# Patient Record
Sex: Female | Born: 1980
Health system: Southern US, Community
[De-identification: ages and names within clinical notes are randomized; demographics above are authoritative.]

## PROBLEM LIST (undated history)

## (undated) DIAGNOSIS — I10 Essential (primary) hypertension: Secondary | ICD-10-CM

## (undated) DIAGNOSIS — E079 Disorder of thyroid, unspecified: Secondary | ICD-10-CM

## (undated) DIAGNOSIS — E559 Vitamin D deficiency, unspecified: Secondary | ICD-10-CM

## (undated) DIAGNOSIS — K649 Unspecified hemorrhoids: Secondary | ICD-10-CM

## (undated) DIAGNOSIS — K30 Functional dyspepsia: Secondary | ICD-10-CM

## (undated) DIAGNOSIS — D219 Benign neoplasm of connective and other soft tissue, unspecified: Secondary | ICD-10-CM

## (undated) DIAGNOSIS — E119 Type 2 diabetes mellitus without complications: Secondary | ICD-10-CM

## (undated) DIAGNOSIS — R270 Ataxia, unspecified: Secondary | ICD-10-CM

## (undated) HISTORY — DX: Type 2 diabetes mellitus without complications: E11.9

## (undated) HISTORY — DX: Benign neoplasm of connective and other soft tissue, unspecified: D21.9

## (undated) HISTORY — PX: TUBAL LIGATION: SHX77

## (undated) HISTORY — PX: NO PAST SURGERIES: SHX2092

## (undated) HISTORY — DX: Disorder of thyroid, unspecified: E07.9

## (undated) HISTORY — DX: Ataxia, unspecified: R27.0

## (undated) HISTORY — DX: Essential (primary) hypertension: I10

---
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
## 2001-01-09 ENCOUNTER — Emergency Department (HOSPITAL_COMMUNITY): Admission: EM | Admit: 2001-01-09 | Discharge: 2001-01-09 | Payer: Self-pay | Admitting: Internal Medicine

## 2001-01-17 ENCOUNTER — Encounter: Payer: Self-pay | Admitting: Internal Medicine

## 2001-01-17 ENCOUNTER — Ambulatory Visit (HOSPITAL_COMMUNITY): Admission: RE | Admit: 2001-01-17 | Discharge: 2001-01-17 | Payer: Self-pay | Admitting: Internal Medicine

## 2002-11-14 ENCOUNTER — Emergency Department (HOSPITAL_COMMUNITY): Admission: EM | Admit: 2002-11-14 | Discharge: 2002-11-14 | Payer: Self-pay | Admitting: Emergency Medicine

## 2002-11-16 ENCOUNTER — Emergency Department (HOSPITAL_COMMUNITY): Admission: EM | Admit: 2002-11-16 | Discharge: 2002-11-16 | Payer: Self-pay | Admitting: Emergency Medicine

## 2004-03-04 ENCOUNTER — Emergency Department (HOSPITAL_COMMUNITY): Admission: EM | Admit: 2004-03-04 | Discharge: 2004-03-04 | Payer: Self-pay | Admitting: Emergency Medicine

## 2005-04-01 ENCOUNTER — Emergency Department (HOSPITAL_COMMUNITY): Admission: EM | Admit: 2005-04-01 | Discharge: 2005-04-01 | Payer: Self-pay | Admitting: Emergency Medicine

## 2007-03-28 ENCOUNTER — Emergency Department (HOSPITAL_COMMUNITY): Admission: EM | Admit: 2007-03-28 | Discharge: 2007-03-28 | Payer: Self-pay | Admitting: Emergency Medicine

## 2009-08-29 ENCOUNTER — Emergency Department (HOSPITAL_COMMUNITY): Admission: EM | Admit: 2009-08-29 | Discharge: 2009-08-29 | Payer: Self-pay | Admitting: Emergency Medicine

## 2011-07-09 ENCOUNTER — Emergency Department (HOSPITAL_COMMUNITY)
Admission: EM | Admit: 2011-07-09 | Discharge: 2011-07-09 | Disposition: A | Discharge status: Home / Self Care | Payer: Self-pay | Attending: Emergency Medicine | Admitting: Emergency Medicine

## 2011-07-09 DIAGNOSIS — R300 Dysuria: Secondary | ICD-10-CM | POA: Insufficient documentation

## 2011-07-09 DIAGNOSIS — R350 Frequency of micturition: Secondary | ICD-10-CM | POA: Insufficient documentation

## 2011-07-09 HISTORY — DX: Functional dyspepsia: K30

## 2011-07-09 LAB — URINALYSIS, ROUTINE W REFLEX MICROSCOPIC
Bilirubin Urine: NEGATIVE
Glucose, UA: NEGATIVE mg/dL
Hgb urine dipstick: NEGATIVE
Ketones, ur: NEGATIVE mg/dL
Leukocytes, UA: NEGATIVE
Nitrite: NEGATIVE
Protein, ur: NEGATIVE mg/dL
Specific Gravity, Urine: 1.025 (ref 1.005–1.030)
Urobilinogen, UA: 0.2 mg/dL (ref 0.0–1.0)
pH: 6 (ref 5.0–8.0)

## 2011-07-09 MED ORDER — PHENAZOPYRIDINE HCL 200 MG PO TABS: 200.0000 mg | ORAL_TABLET | Freq: Three times a day (TID) | ORAL | Status: AC

## 2011-07-09 NOTE — ED Notes (Signed)
Complain of pain with urination

## 2011-07-09 NOTE — ED Provider Notes (Signed)
Medical screening examination/treatment/procedure(s) were performed by non-physician practitioner and as supervising physician I was immediately available for consultation/collaboration.  Donnetta Hutching, MD 07/09/11 [phone]

## 2011-07-09 NOTE — ED Provider Notes (Signed)
History     CSN: [id number] Arrival date & time: 07/09/2011  1:28 PM   First MD Initiated Contact with Patient 07/09/11 1422      Chief Complaint  Patient presents with  . Dysuria    (Consider location/radiation/quality/duration/timing/severity/associated sxs/prior treatment) HPI Comments: R sided "stabbing pains when i need to urinate".  Uncomfortable when she urinates.  Urine is "dark" but not malodorous.  No hematuria.  + frequency.  No urgency.  No fever.  Mild B lower back pain.  No vaginal d/c.  LMP ~ 2 weeks ago.  irreg menses.  Denies being sexually active.  Patient is a 30 y.o. female presenting with dysuria. The history is provided by the patient. No language interpreter was used.  Dysuria  This is a new problem. Episode onset: 3 days ago. The problem occurs every urination. The problem has not changed since onset.The quality of the pain is described as stabbing. The pain is moderate. Associated symptoms include frequency. Pertinent negatives include no chills, no nausea, no vomiting, no hematuria, no urgency and no flank pain.    Past Medical History  Diagnosis Date  . Acid indigestion     History reviewed. No pertinent past surgical history.  History reviewed. No pertinent family history.  History  Substance Use Topics  . Smoking status: Not on file  . Smokeless tobacco: Not on file  . Alcohol Use: No    OB History    Grav Para Term Preterm Abortions TAB SAB Ect Mult Living                  Review of Systems  Constitutional: Negative for fever and chills.  Gastrointestinal: Negative for nausea and vomiting.  Genitourinary: Positive for dysuria and frequency. Negative for urgency, hematuria, flank pain, vaginal bleeding, vaginal discharge and vaginal pain.  All other systems reviewed and are negative.    Allergies  Review of patient's allergies indicates no known allergies.  Home Medications   Current Outpatient Rx  Name Route Sig Dispense Refill    . UNKNOWN TO PATIENT Oral Take 1 tablet by mouth daily. Unable to tell me name of medication. Finished yesterday       BP 125/75  Pulse 83  Temp(Src) 97.7 F (36.5 C) (Oral)  Resp 17  Ht 4\' 11"  (1.499 m)  Wt 184 lb (83.462 kg)  BMI 37.16 kg/m2  SpO2 97%  Physical Exam  Abdominal: There is no tenderness. There is no rigidity, no rebound, no guarding, no CVA tenderness, no tenderness at McBurney's point and negative Murphy's sign.  Musculoskeletal:       Lumbar back: She exhibits pain. She exhibits normal range of motion, no tenderness, no bony tenderness, no swelling, no deformity, no laceration, no spasm and normal pulse.       Back:    ED Course  Procedures (including critical care time)   Labs Reviewed  URINALYSIS, ROUTINE W REFLEX MICROSCOPIC   No results found.   No diagnosis found.    MDM          Worthy Rancher, PA 07/09/11 [phone]

## 2011-07-10 LAB — URINE CULTURE
Colony Count: NO GROWTH
Culture  Setup Time: 201212131650
Culture: NO GROWTH

## 2012-02-20 ENCOUNTER — Emergency Department (HOSPITAL_COMMUNITY)
Admission: EM | Admit: 2012-02-20 | Discharge: 2012-02-20 | Disposition: A | Discharge status: Home / Self Care | Payer: Self-pay | Attending: Emergency Medicine | Admitting: Emergency Medicine

## 2012-02-20 ENCOUNTER — Encounter (HOSPITAL_COMMUNITY): Payer: Self-pay

## 2012-02-20 DIAGNOSIS — Y9229 Other specified public building as the place of occurrence of the external cause: Secondary | ICD-10-CM | POA: Insufficient documentation

## 2012-02-20 DIAGNOSIS — T7840XA Allergy, unspecified, initial encounter: Secondary | ICD-10-CM | POA: Insufficient documentation

## 2012-02-20 DIAGNOSIS — R21 Rash and other nonspecific skin eruption: Secondary | ICD-10-CM | POA: Insufficient documentation

## 2012-02-20 DIAGNOSIS — I10 Essential (primary) hypertension: Secondary | ICD-10-CM | POA: Insufficient documentation

## 2012-02-20 NOTE — ED Notes (Signed)
Pt was out this afternoon w/ children left hand began itching and swelling, she took benadryl and now better but wants to get checked out.

## 2012-02-20 NOTE — ED Provider Notes (Signed)
History    This chart was scribed for Charles B. Bernette Mayers, MD, MD by Smitty Pluck. The patient was seen in room APA16A and the patient's care was started at 3:44PM.   CSN: [id number]  Arrival date & time 02/20/12  1500   First MD Initiated Contact with Patient 02/20/12 1538      Chief Complaint  Patient presents with  . Allergic Reaction    (Consider location/radiation/quality/duration/timing/severity/associated sxs/prior treatment) Patient is a 31 y.o. female presenting with allergic reaction. The history is provided by the patient.  Allergic Reaction   Tara Stark is a 31 y.o. female who presents to the Emergency Department complaining of moderate allergic rxn onset today 2 hours ago. Pt reports having left hand itching and swelling. Pt reports taking benadryl afterwards with relief. Pt reports that her ears still feel funny and mild swelling.  Denies any problems with breathing. She reports [place]. Denies any other pain. Denies any known allergies.   Past Medical History  Diagnosis Date  . Acid indigestion     History reviewed. No pertinent past surgical history.  No family history on file.  History  Substance Use Topics  . Smoking status: Never Smoker   . Smokeless tobacco: Not on file  . Alcohol Use: No    OB History    Grav Para Term Preterm Abortions TAB SAB Ect Mult Living                  Review of Systems  All other systems reviewed and are negative.   10 Systems reviewed and all are negative for acute change except as noted in the HPI.   Allergies  Review of patient's allergies indicates no known allergies.  Home Medications   Current Outpatient Rx  Name Route Sig Dispense Refill  . UNKNOWN TO PATIENT Oral Take 1 tablet by mouth daily. Unable to tell me name of medication. Finished yesterday       BP 140/82  Pulse 90  Temp 98.5 F (36.9 C) (Oral)  Resp 20  Ht 4\' 11"  (1.499 m)  Wt 189 lb 6 oz (85.9  kg)  BMI 38.25 kg/m2  SpO2 100%  LMP 06/27/2011  Physical Exam  Nursing note and vitals reviewed. Constitutional: She is oriented to person, place, and time. She appears well-developed and well-nourished.  HENT:  Head: Normocephalic and atraumatic.  Right Ear: Tympanic membrane and ear canal normal.  Left Ear: Tympanic membrane and ear canal normal.  Eyes: EOM are normal. Pupils are equal, round, and reactive to light.  Neck: Normal range of motion. Neck supple.  Cardiovascular: Normal rate, normal heart sounds and intact distal pulses.   Pulmonary/Chest: Effort normal and breath sounds normal.  Abdominal: Bowel sounds are normal. She exhibits no distension. There is no tenderness.  Musculoskeletal: Normal range of motion. She exhibits no edema and no tenderness.  Neurological: She is alert and oriented to person, place, and time. She has normal strength. No cranial nerve deficit or sensory deficit.  Skin: Skin is warm and dry. No rash noted.  Psychiatric: She has a normal mood and affect.    ED Course  Procedures (including critical care time) DIAGNOSTIC STUDIES: Oxygen Saturation is 100% on room air, normal by my interpretation.    COORDINATION OF CARE: 3:48PM EDP discusses pt ED treatment with pt     Labs Reviewed - No data to display No results found.   No diagnosis found.  MDM  Mild allergic reaction to hand has resolved. No clear reason for "ear fullness" Advised to return to ED for worsening swelling, rash or breathing problems or if she has ear pain, drainage or fever.   I personally performed the services described in the documentation, which were scribed in my presence. The recorded information has been reviewed and considered.         Charles B. Bernette Mayers, MD 02/20/12 1556

## 2012-09-17 ENCOUNTER — Encounter (HOSPITAL_COMMUNITY): Payer: Self-pay | Admitting: *Deleted

## 2012-09-17 ENCOUNTER — Emergency Department (HOSPITAL_COMMUNITY)
Admission: EM | Admit: 2012-09-17 | Discharge: 2012-09-17 | Disposition: A | Discharge status: Home / Self Care | Payer: Self-pay | Attending: Emergency Medicine | Admitting: Emergency Medicine

## 2012-09-17 DIAGNOSIS — K625 Hemorrhage of anus and rectum: Secondary | ICD-10-CM | POA: Insufficient documentation

## 2012-09-17 DIAGNOSIS — Z8719 Personal history of other diseases of the digestive system: Secondary | ICD-10-CM | POA: Insufficient documentation

## 2012-09-17 DIAGNOSIS — K649 Unspecified hemorrhoids: Secondary | ICD-10-CM | POA: Insufficient documentation

## 2012-09-17 DIAGNOSIS — K921 Melena: Secondary | ICD-10-CM | POA: Insufficient documentation

## 2012-09-17 DIAGNOSIS — Z3202 Encounter for pregnancy test, result negative: Secondary | ICD-10-CM | POA: Insufficient documentation

## 2012-09-17 LAB — POCT I-STAT, CHEM 8
Calcium, Ion: 1.15 mmol/L (ref 1.12–1.23)
HCT: 43 % (ref 36.0–46.0)
Sodium: 139 mEq/L (ref 135–145)
TCO2: 30 mmol/L (ref 0–100)

## 2012-09-17 LAB — URINALYSIS, ROUTINE W REFLEX MICROSCOPIC
Glucose, UA: NEGATIVE mg/dL
Hgb urine dipstick: NEGATIVE
Leukocytes, UA: NEGATIVE
Protein, ur: NEGATIVE mg/dL
Specific Gravity, Urine: 1.025 (ref 1.005–1.030)
Urobilinogen, UA: 1 mg/dL (ref 0.0–1.0)

## 2012-09-17 LAB — PREGNANCY, URINE: Preg Test, Ur: NEGATIVE

## 2012-09-17 MED ORDER — HYDROCORTISONE 2.5 % RE CREA: TOPICAL_CREAM | RECTAL | Status: DC

## 2012-09-17 MED ORDER — DOCUSATE SODIUM 100 MG PO CAPS: 100.0000 mg | ORAL_CAPSULE | Freq: Two times a day (BID) | ORAL | Status: DC

## 2012-09-17 NOTE — ED Notes (Signed)
Pt c/o bright red blood in stool since yesterday.

## 2012-09-17 NOTE — ED Provider Notes (Signed)
History     CSN: [id number]  Arrival date & time 09/17/12  1945   First MD Initiated Contact with Patient 09/17/12 1957      Chief Complaint  Patient presents with  . Rectal Bleeding    (Consider location/radiation/quality/duration/timing/severity/associated sxs/prior treatment) HPI Comments: Patient presents with bright red blood with bowel movements for the past 2 days. This is happened 2 times. She missed her stools are firm and she has to strain. No history of hemorrhoids. She denies any abdominal pain nausea vomiting or diarrhea. The toilet water turned red and she has red with wiping. She denies any chest pain or shortness of breath, cough fever urinary symptoms she is not on any anticoagulation.  The history is provided by the patient.    Past Medical History  Diagnosis Date  . Acid indigestion     History reviewed. No pertinent past surgical history.  History reviewed. No pertinent family history.  History  Substance Use Topics  . Smoking status: Never Smoker   . Smokeless tobacco: Not on file  . Alcohol Use: No    OB History   Grav Para Term Preterm Abortions TAB SAB Ect Mult Living                  Review of Systems  Constitutional: Negative for fever, activity change and appetite change.  HENT: Negative for congestion and rhinorrhea.   Respiratory: Negative for cough, chest tightness and shortness of breath.   Cardiovascular: Negative for chest pain.  Gastrointestinal: Positive for blood in stool and hematochezia. Negative for nausea, vomiting and abdominal pain.  Genitourinary: Negative for dysuria, vaginal bleeding and vaginal discharge.  Skin: Negative for rash.  Neurological: Negative for dizziness and headaches.  A complete 10 system review of systems was obtained and all systems are negative except as noted in the HPI and PMH.    Allergies  Review of patient's allergies indicates no known allergies.  Home Medications   Current Outpatient Rx   Name  Route  Sig  Dispense  Refill  . docusate sodium (COLACE) 100 MG capsule   Oral   Take 1 capsule (100 mg total) by mouth every 12 (twelve) hours.   60 capsule   0   . hydrocortisone (ANUSOL-HC) 2.5 % rectal cream      Apply rectally 2 times daily   30 g   0     BP 142/95  Pulse 105  Temp(Src) 98.1 F (36.7 C)  Resp 20  Ht 4\' 11"  (1.499 m)  Wt 180 lb (81.647 kg)  BMI 36.34 kg/m2  SpO2 99%  LMP 07/31/2012  Physical Exam  Constitutional: She is oriented to person, place, and time. She appears well-developed and well-nourished. No distress.  texting on phone, no distress  HENT:  Head: Normocephalic and atraumatic.  Mouth/Throat: Oropharynx is clear and moist. No oropharyngeal exudate.  Eyes: Conjunctivae and EOM are normal. Pupils are equal, round, and reactive to light.  Neck: Normal range of motion. Neck supple.  Cardiovascular: Normal rate, regular rhythm and normal heart sounds.   No murmur heard. Pulmonary/Chest: Effort normal and breath sounds normal. No respiratory distress.  Abdominal: Soft. There is no tenderness. There is no rebound and no guarding.  Genitourinary: Guaiac negative stool.  Small external hemorrhoid at 6 oclock. Not thrombosed. No fissures. Guiac negative.  Chaperone present  Musculoskeletal: Normal range of motion. She exhibits no edema and no tenderness.  Neurological: She is alert and oriented to person, place, and  time. No cranial nerve deficit. She exhibits normal muscle tone. Coordination normal.  Skin: Skin is warm.    ED Course  Procedures (including critical care time)  Labs Reviewed  POCT I-STAT, CHEM 8 - Abnormal; Notable for the following:    Glucose, Bld 125 (*)    All other components within normal limits  URINALYSIS, ROUTINE W REFLEX MICROSCOPIC  PREGNANCY, URINE   No results found.   1. Rectal bleeding   2. Hemorrhoid       MDM  Bright red blood in the toilet bowl with stools. No abdominal pain nausea or  vomiting.  Continues to play on phone during interview.   Hemoglobin stable. Occult blood negative today. Small external hemorrhoid on exam.  Patient no distress. Abdomen soft and nontender. We'll treat symptomatically for hemorrhage giving stool softeners.     Glynn Octave, MD 09/17/12 2125

## 2012-10-01 ENCOUNTER — Encounter (HOSPITAL_COMMUNITY): Payer: Self-pay | Admitting: *Deleted

## 2012-10-01 ENCOUNTER — Emergency Department (HOSPITAL_COMMUNITY)
Admission: EM | Admit: 2012-10-01 | Discharge: 2012-10-01 | Disposition: A | Discharge status: Home / Self Care | Payer: Self-pay | Attending: Emergency Medicine | Admitting: Emergency Medicine

## 2012-10-01 DIAGNOSIS — Z3202 Encounter for pregnancy test, result negative: Secondary | ICD-10-CM | POA: Insufficient documentation

## 2012-10-01 DIAGNOSIS — R112 Nausea with vomiting, unspecified: Secondary | ICD-10-CM | POA: Insufficient documentation

## 2012-10-01 DIAGNOSIS — R197 Diarrhea, unspecified: Secondary | ICD-10-CM | POA: Insufficient documentation

## 2012-10-01 DIAGNOSIS — Z8719 Personal history of other diseases of the digestive system: Secondary | ICD-10-CM | POA: Insufficient documentation

## 2012-10-01 LAB — PREGNANCY, URINE: Preg Test, Ur: NEGATIVE

## 2012-10-01 LAB — URINALYSIS, ROUTINE W REFLEX MICROSCOPIC
Bilirubin Urine: NEGATIVE
Leukocytes, UA: NEGATIVE
Nitrite: NEGATIVE
Specific Gravity, Urine: 1.015 (ref 1.005–1.030)
Urobilinogen, UA: 0.2 mg/dL (ref 0.0–1.0)

## 2012-10-01 LAB — BASIC METABOLIC PANEL
CO2: 27 mEq/L (ref 19–32)
Chloride: 103 mEq/L (ref 96–112)
Creatinine, Ser: 0.54 mg/dL (ref 0.50–1.10)
GFR calc Af Amer: >90 mL/min (ref >90)
Potassium: 3.7 mEq/L (ref 3.5–5.1)
Sodium: 140 mEq/L (ref 135–145)

## 2012-10-01 LAB — CBC
Platelets: 224 10*3/uL (ref 150–400)
RBC: 5.26 MIL/uL — ABNORMAL HIGH (ref 3.87–5.11)
RDW: 14.9 % (ref 11.5–15.5)
WBC: 9.7 10*3/uL (ref 4.0–10.5)

## 2012-10-01 LAB — URINE MICROSCOPIC-ADD ON

## 2012-10-01 MED ORDER — SODIUM CHLORIDE 0.9 % IV SOLN: 1000.0000 mL | INTRAVENOUS | Status: DC

## 2012-10-01 MED ORDER — ONDANSETRON HCL 4 MG/2ML IJ SOLN
4.0000 mg | Freq: Once | INTRAMUSCULAR | Status: AC
  Administered 2012-10-01: 4 mg via INTRAVENOUS
  Filled 2012-10-01: qty 2

## 2012-10-01 MED ORDER — SODIUM CHLORIDE 0.9 % IV SOLN
1000.0000 mL | Freq: Once | INTRAVENOUS | Status: AC
  Administered 2012-10-01: 1000 mL via INTRAVENOUS

## 2012-10-01 MED ORDER — PROMETHAZINE HCL 25 MG PO TABS: 25.0000 mg | ORAL_TABLET | Freq: Four times a day (QID) | ORAL | Status: DC | PRN

## 2012-10-01 NOTE — ED Notes (Signed)
Pt c/o n/v/d, n/v X4 episodes, diarrhea X5 since the middle of night, denies any pain. 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

## 2012-10-01 NOTE — ED Provider Notes (Signed)
History     This chart was scribed for Lyanne Co, MD, MD by Smitty Pluck, ED Scribe. The patient was seen in room APA08/APA08 and the patient's care was started at 8:05 AM.   CSN: [id number]  Arrival date & time 10/01/12  0734      No chief complaint on file.    The history is provided by the patient. No language interpreter was used.   Tara Stark is a 32 y.o. female who presents to the Emergency Department complaining of constant, moderate emesis and diarrhea onset today 6 hours ago. She states she has vomited 4x and had 5 episodes of diarrhea. She reports that her diarrhea is watery. Pt denies hematemesis, blood in stool, abdominal pain, fever, weakness, cough, SOB and any other pain. 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   Past Medical History  Diagnosis Date  . Acid indigestion     No past surgical history on file.  No family history on file.  History  Substance Use Topics  . Smoking status: Never Smoker   . Smokeless tobacco: Not on file  . Alcohol Use: No    OB History   Grav Para Term Preterm Abortions TAB SAB Ect Mult Living                  Review of Systems 10 Systems reviewed and all are negative for acute change except as noted in the HPI.   Allergies  Review of patient's allergies indicates no known allergies.  Home Medications   Current Outpatient Rx  Name  Route  Sig  Dispense  Refill  . docusate sodium (COLACE) 100 MG capsule   Oral   Take 1 capsule (100 mg total) by mouth every 12 (twelve) hours.   60 capsule   0   . hydrocortisone (ANUSOL-HC) 2.5 % rectal cream      Apply rectally 2 times daily   30 g   0     BP 147/79  Pulse 120  Temp(Src) 98.5 F (36.9 C) (Oral)  SpO2 100%  LMP 08/31/2012  Physical Exam  Nursing note and vitals reviewed. Constitutional: She is oriented to person, place, and time. She appears well-developed and well-nourished. No distress.  HENT:   Head: Normocephalic and atraumatic.  Eyes: EOM are normal.  Neck: Normal range of motion.  Cardiovascular: Normal rate, regular rhythm and normal heart sounds.   Pulmonary/Chest: Effort normal and breath sounds normal.  Abdominal: Soft. She exhibits no distension. There is no tenderness.  Musculoskeletal: Normal range of motion.  Neurological: She is alert and oriented to person, place, and time.  Skin: Skin is warm and dry.  Psychiatric: She has a normal mood and affect. Judgment normal.    ED Course  Procedures (including critical care time) DIAGNOSTIC STUDIES: Oxygen Saturation is 100% on room air, normal by my interpretation.    COORDINATION OF CARE: 8:07 AM Discussed ED treatment with pt and pt agrees.  8:10 AM Ordered:  Medications  0.9 %  sodium chloride infusion (1,000 mLs Intravenous New Bag/Given 10/01/12 0818)    Followed by  0.9 %  sodium chloride infusion (not administered)  ondansetron (ZOFRAN) injection 4 mg (4 mg Intravenous Given 10/01/12 0815)   9:44 AM Recheck: Discussed lab results and treatment course with pt. Pt is feeling better. Pt is ready for discharge.      Labs Reviewed  CBC - Abnormal; Notable for the following:  RBC 5.26 (*)    All other components within normal limits  BASIC METABOLIC PANEL - Abnormal; Notable for the following:    Glucose, Bld 105 (*)    All other components within normal limits  URINALYSIS, ROUTINE W REFLEX MICROSCOPIC - Abnormal; Notable for the following:    Hgb urine dipstick SMALL (*)    Protein, ur 30 (*)    All other components within normal limits  URINE MICROSCOPIC-ADD ON - Abnormal; Notable for the following:    Squamous Epithelial / LPF FEW (*)    All other components within normal limits  PREGNANCY, URINE   No results found.   1. Nausea vomiting and diarrhea       MDM  10:12 AM Patient feels much better at this time.  Discharge home with a prescription for antinausea medicine.  She understands to  return to the ER for new or worsening symptoms    I personally performed the services described in this documentation, which was scribed in my presence. The recorded information has been reviewed and is accurate.      Lyanne Co, MD 10/01/12 1013

## 2012-10-01 NOTE — ED Notes (Signed)
Patient states she does not need anything at this time. 

## 2014-07-25 ENCOUNTER — Emergency Department (HOSPITAL_COMMUNITY)
Admission: EM | Admit: 2014-07-25 | Discharge: 2014-07-25 | Disposition: A | Discharge status: Home / Self Care | Payer: Self-pay | Attending: Emergency Medicine | Admitting: Emergency Medicine

## 2014-07-25 ENCOUNTER — Encounter (HOSPITAL_COMMUNITY): Payer: Self-pay | Admitting: *Deleted

## 2014-07-25 DIAGNOSIS — H6123 Impacted cerumen, bilateral: Secondary | ICD-10-CM | POA: Insufficient documentation

## 2014-07-25 DIAGNOSIS — J020 Streptococcal pharyngitis: Secondary | ICD-10-CM | POA: Insufficient documentation

## 2014-07-25 LAB — RAPID STREP SCREEN (MED CTR MEBANE ONLY): STREPTOCOCCUS, GROUP A SCREEN (DIRECT): POSITIVE — AB

## 2014-07-25 MED ORDER — NAPROXEN 500 MG PO TABS: 500.0000 mg | ORAL_TABLET | Freq: Two times a day (BID) | ORAL | Status: DC

## 2014-07-25 MED ORDER — PENICILLIN V POTASSIUM 500 MG PO TABS: 500.0000 mg | ORAL_TABLET | Freq: Three times a day (TID) | ORAL | Status: AC

## 2014-07-25 MED ORDER — HYDROGEN PEROXIDE 3 % EX SOLN
CUTANEOUS | Status: AC
  Filled 2014-07-25: qty 473

## 2014-07-25 NOTE — ED Notes (Signed)
Pt alert & oriented x4, stable gait. Patient given discharge instructions, paperwork & prescription(s). Patient  instructed to stop at the registration desk to finish any additional paperwork. Patient verbalized understanding. Pt left department w/ no further questions. 

## 2014-07-25 NOTE — ED Notes (Signed)
Pt co sore throat and bilateral ear pain since Sunday.

## 2014-07-25 NOTE — Discharge Instructions (Signed)
Cerumen Impaction A cerumen impaction is when the wax in your ear forms a plug. This plug usually causes reduced hearing. Sometimes it also causes an earache or dizziness. Removing a cerumen impaction can be difficult and painful. The wax sticks to the ear canal. The canal is sensitive and bleeds easily. If you try to remove a heavy wax buildup with a cotton tipped swab, you may push it in further. Irrigation with water, suction, and small ear curettes may be used to clear out the wax. If the impaction is fixed to the skin in the ear canal, ear drops may be needed for a few days to loosen the wax. People who build up a lot of wax frequently can use ear wax removal products available in your local drugstore. SEEK MEDICAL CARE IF:  You develop an earache, increased hearing loss, or marked dizziness. Document Released: 08/20/2004 Document Revised: 10/05/2011 Document Reviewed: 10/10/2009 [facility] Patient Information 2015 [place], [place]. This information is not intended to replace advice given to you by your health care provider. Make sure you discuss any questions you have with your health care provider.  Sore Throat A sore throat is pain, burning, irritation, or scratchiness of the throat. There is often pain or tenderness when swallowing or talking. A sore throat may be accompanied by other symptoms, such as coughing, sneezing, fever, and swollen neck glands. A sore throat is often the first sign of another sickness, such as a cold, flu, strep throat, or mononucleosis (commonly known as mono). Most sore throats go away without medical treatment. CAUSES  The most common causes of a sore throat include:  A viral infection, such as a cold, flu, or mono.  A bacterial infection, such as strep throat, tonsillitis, or whooping cough.  Seasonal allergies.  Dryness in the air.  Irritants, such as smoke or pollution.  Gastroesophageal reflux disease (GERD). HOME CARE INSTRUCTIONS   Only take  over-the-counter medicines as directed by your caregiver.  Drink enough fluids to keep your urine clear or pale yellow.  Rest as needed.  Try using throat sprays, lozenges, or sucking on hard candy to ease any pain (if older than 4 years or as directed).  Sip warm liquids, such as broth, herbal tea, or warm water with honey to relieve pain temporarily. You may also eat or drink cold or frozen liquids such as frozen ice pops.  Gargle with salt water (mix 1 tsp salt with 8 oz of water).  Do not smoke and avoid secondhand smoke.  Put a cool-mist humidifier in your bedroom at night to moisten the air. You can also turn on a hot shower and sit in the bathroom with the door closed for 5-10 minutes. SEEK IMMEDIATE MEDICAL CARE IF:  You have difficulty breathing.  You are unable to swallow fluids, soft foods, or your saliva.  You have increased swelling in the throat.  Your sore throat does not get better in 7 days.  You have nausea and vomiting.  You have a fever or persistent symptoms for more than 2-3 days.  You have a fever and your symptoms suddenly get worse. MAKE SURE YOU:   Understand these instructions.  Will watch your condition.  Will get help right away if you are not doing well or get worse. Document Released: 08/20/2004 Document Revised: 06/29/2012 Document Reviewed: 03/20/2012 [facility] Patient Information 2015 [place], [place]. This information is not intended to replace advice given to you by your health care provider. Make sure you discuss any questions  you have with your health care provider.

## 2014-07-25 NOTE — ED Provider Notes (Signed)
CSN: [id number]     Arrival date & time 07/25/14  0865 History   This chart was scribed for Dorie Rank, MD by Lowella Petties, ED Scribe. The patient was seen in room APA19/APA19. Patient's care was started at 7:16 AM.   Chief Complaint  Patient presents with  . Sore Throat   The history is provided by the patient. No language interpreter was used.   HPI Comments: Tara Stark is a 33 y.o. female who presents to the Emergency Department complaining of a constant, moderate, sore throat which began 4 days ago. She states that the pain is exacerbated by swallowing. She reports associated chills and each ache. She reports mild rhinorreha and cough. She has taken Ibuprofen with minimal relief. She denies congestion or severe cough.   Past Medical History  Diagnosis Date  . Acid indigestion    History reviewed. No pertinent past surgical history. History reviewed. No pertinent family history. History  Substance Use Topics  . Smoking status: Never Smoker   . Smokeless tobacco: Not on file  . Alcohol Use: No   OB History    No data available     Review of Systems  Constitutional: Positive for chills.  HENT: Positive for ear pain, rhinorrhea and sore throat. Negative for congestion.    A complete 10 system review of systems was obtained and all systems are negative except as noted in the HPI and PMH.   Allergies  Review of patient's allergies indicates no known allergies.  Home Medications   Prior to Admission medications   Medication Sig Start Date End Date Taking? Authorizing Provider  naproxen (NAPROSYN) 500 MG tablet Take 1 tablet (500 mg total) by mouth 2 (two) times daily with a meal. As needed for pain 07/25/14   Dorie Rank, MD  penicillin v potassium (VEETID) 500 MG tablet Take 1 tablet (500 mg total) by mouth 3 (three) times daily. 07/25/14 08/01/14  Dorie Rank, MD  promethazine (PHENERGAN) 25 MG tablet Take 1 tablet (25 mg total) by mouth every 6 (six) hours as needed for  nausea. 10/01/12   Hoy Morn, MD   Triage Vitals: BP 110/82 mmHg  Pulse 88  Temp(Src) 98.7 F (37.1 C) (Oral)  Resp 17  Ht 4\' 11"  (1.499 m)  Wt 170 lb (77.111 kg)  BMI 34.32 kg/m2  SpO2 96%  LMP 07/02/2014 (Exact Date) Physical Exam  Constitutional: She appears well-developed and well-nourished. No distress.  HENT:  Head: Normocephalic and atraumatic.  Right Ear: External ear normal.  Left Ear: External ear normal.  Mouth/Throat: No uvula swelling. Posterior oropharyngeal edema and posterior oropharyngeal erythema present. No oropharyngeal exudate.  Cerumen impaction bilaterally  Eyes: Conjunctivae are normal. Right eye exhibits no discharge. Left eye exhibits no discharge. No scleral icterus.  Neck: Neck supple. No tracheal deviation present.  Cardiovascular: Normal rate, regular rhythm and intact distal pulses.   Pulmonary/Chest: Effort normal and breath sounds normal. No stridor. No respiratory distress. She has no wheezes. She has no rales.  Abdominal: Soft. Bowel sounds are normal. She exhibits no distension. There is no tenderness. There is no rebound and no guarding.  Musculoskeletal: She exhibits no edema or tenderness.  Neurological: She is alert. She has normal strength. No cranial nerve deficit (no facial droop, extraocular movements intact, no slurred speech) or sensory deficit. She exhibits normal muscle tone. She displays no seizure activity. Coordination normal.  Skin: Skin is warm and dry. No rash noted.  Psychiatric: She has a  normal mood and affect.  Nursing note and vitals reviewed.   ED Course  Procedures (including critical care time) DIAGNOSTIC STUDIES: Oxygen Saturation is 96% on room air, normal by my interpretation.    COORDINATION OF CARE: 7:19 AM-Discussed treatment plan which includes strep screen and ear cleaning with pt at bedside and pt agreed to plan.   Labs Review Labs Reviewed  RAPID STREP SCREEN - Abnormal; Notable for the following:     Streptococcus, Group A Screen (Direct) POSITIVE (*)    All other components within normal limits      MDM   Final diagnoses:  Strep pharyngitis  Cerumen impaction, bilateral   Will dc home with pen for strep throat.  Cerumen impaction treated in the ED with ear irrigation by nursing.   Discussed finding and plan with patient.  I personally performed the services described in this documentation, which was scribed in my presence.  The recorded information has been reviewed and is accurate.    Dorie Rank, MD 07/25/14 [phone]

## 2014-10-11 ENCOUNTER — Encounter (HOSPITAL_COMMUNITY): Payer: Self-pay | Admitting: *Deleted

## 2014-10-11 ENCOUNTER — Emergency Department (HOSPITAL_COMMUNITY)
Admission: EM | Admit: 2014-10-11 | Discharge: 2014-10-11 | Disposition: A | Discharge status: Home / Self Care | Payer: Self-pay | Attending: Emergency Medicine | Admitting: Emergency Medicine

## 2014-10-11 DIAGNOSIS — Z79899 Other long term (current) drug therapy: Secondary | ICD-10-CM | POA: Insufficient documentation

## 2014-10-11 DIAGNOSIS — K529 Noninfective gastroenteritis and colitis, unspecified: Secondary | ICD-10-CM | POA: Insufficient documentation

## 2014-10-11 LAB — CBC WITH DIFFERENTIAL/PLATELET
BASOS PCT: 0 % (ref 0–1)
Basophils Absolute: 0 10*3/uL (ref 0.0–0.1)
EOS ABS: 0 10*3/uL (ref 0.0–0.7)
Eosinophils Relative: 0 % (ref 0–5)
HEMATOCRIT: 43.3 % (ref 36.0–46.0)
HEMOGLOBIN: 14.3 g/dL (ref 12.0–15.0)
LYMPHS ABS: 0.6 10*3/uL — AB (ref 0.7–4.0)
LYMPHS PCT: 6 % — AB (ref 12–46)
MCH: 26.7 pg (ref 26.0–34.0)
MCHC: 33 g/dL (ref 30.0–36.0)
MCV: 80.9 fL (ref 78.0–100.0)
MONO ABS: 0.5 10*3/uL (ref 0.1–1.0)
MONOS PCT: 6 % (ref 3–12)
NEUTROS ABS: 8 10*3/uL — AB (ref 1.7–7.7)
Neutrophils Relative %: 88 % — ABNORMAL HIGH (ref 43–77)
Platelets: 205 10*3/uL (ref 150–400)
RBC: 5.35 MIL/uL — ABNORMAL HIGH (ref 3.87–5.11)
RDW: 16.7 % — ABNORMAL HIGH (ref 11.5–15.5)
WBC: 9.1 10*3/uL (ref 4.0–10.5)

## 2014-10-11 LAB — URINALYSIS, ROUTINE W REFLEX MICROSCOPIC
Bilirubin Urine: NEGATIVE
Glucose, UA: NEGATIVE mg/dL
Hgb urine dipstick: NEGATIVE
Ketones, ur: 15 mg/dL — AB
LEUKOCYTES UA: NEGATIVE
NITRITE: NEGATIVE
PH: 6 (ref 5.0–8.0)
PROTEIN: NEGATIVE mg/dL
Specific Gravity, Urine: 1.02 (ref 1.005–1.030)
UROBILINOGEN UA: 0.2 mg/dL (ref 0.0–1.0)

## 2014-10-11 LAB — COMPREHENSIVE METABOLIC PANEL
ALBUMIN: 4.1 g/dL (ref 3.5–5.2)
ALT: 17 U/L (ref 0–35)
AST: 19 U/L (ref 0–37)
Alkaline Phosphatase: 44 U/L (ref 39–117)
Anion gap: 10 (ref 5–15)
BUN: 12 mg/dL (ref 6–23)
CO2: 23 mmol/L (ref 19–32)
CREATININE: 0.51 mg/dL (ref 0.50–1.10)
Calcium: 8.6 mg/dL (ref 8.4–10.5)
Chloride: 105 mmol/L (ref 96–112)
GFR calc Af Amer: >90 mL/min (ref >90)
Glucose, Bld: 126 mg/dL — ABNORMAL HIGH (ref 70–99)
Potassium: 3.7 mmol/L (ref 3.5–5.1)
Sodium: 138 mmol/L (ref 135–145)
Total Bilirubin: 0.8 mg/dL (ref 0.3–1.2)
Total Protein: 8.2 g/dL (ref 6.0–8.3)

## 2014-10-11 MED ORDER — PROMETHAZINE HCL 25 MG PO TABS: 25.0000 mg | ORAL_TABLET | Freq: Four times a day (QID) | ORAL | Status: DC | PRN

## 2014-10-11 MED ORDER — SODIUM CHLORIDE 0.9 % IV BOLUS (SEPSIS)
1000.0000 mL | Freq: Once | INTRAVENOUS | Status: AC
  Administered 2014-10-11: 1000 mL via INTRAVENOUS

## 2014-10-11 MED ORDER — ONDANSETRON HCL 4 MG/2ML IJ SOLN
4.0000 mg | Freq: Once | INTRAMUSCULAR | Status: AC
  Administered 2014-10-11: 4 mg via INTRAVENOUS
  Filled 2014-10-11: qty 2

## 2014-10-11 NOTE — ED Notes (Signed)
Pt states that she has vomited x 3 since waking this morning. Pt also states chills. Also states generalized abdominal discomfort. 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 Denies diarrhea.

## 2014-10-11 NOTE — ED Notes (Signed)
Pt states that she feels much better at this time.  Watching tv in no distress.

## 2014-10-11 NOTE — Discharge Instructions (Signed)
Medication for nausea. Increase fluids. Avoid rich greasy foods tonight

## 2014-10-11 NOTE — ED Provider Notes (Signed)
CSN: [id number]     Arrival date & time 10/11/14  1235 History  This chart was scribed for Nat Christen, MD by Einar Pheasant, Medical Scribe. This patient was seen in room APA06/APA06 and the patient's care was started at 1:44 PM.    Chief Complaint  Patient presents with  . Emesis   The history is provided by the patient and medical records. No language interpreter was used.   HPI Comments: Tara Stark is a 34 y.o. female with PMhx of acid reflux presents to the Emergency Department complaining of 3 episodes of emesis that occurred this AM since waking. She is also complaining of associated chills and generalized abdominal pain. Pt also endorses eating some lasagna last night an sister is experiencing same chief complaint. Pt denies any fever, neck pain, sore throat, visual disturbance, CP, cough, SOB, diarrhea, urinary symptoms, back pain, HA, weakness, numbness and rash as associated symptoms. She denies taking any daily medications.   Past Medical History  Diagnosis Date  . Acid indigestion    History reviewed. No pertinent past surgical history. No family history on file. History  Substance Use Topics  . Smoking status: Never Smoker   . Smokeless tobacco: Not on file  . Alcohol Use: No   OB History    No data available     Review of Systems  Gastrointestinal: Positive for vomiting.  A complete 10 system review of systems was obtained and all systems are negative except as noted in the HPI and PMH.  Allergies  Review of patient's allergies indicates no known allergies.  Home Medications   Prior to Admission medications   Medication Sig Start Date End Date Taking? Authorizing Provider  Multiple Vitamins-Minerals (AIRBORNE PO) Take 1 tablet by mouth daily.   Yes Historical Provider, MD  naproxen (NAPROSYN) 500 MG tablet Take 1 tablet (500 mg total) by mouth 2 (two) times daily with a meal. As needed for pain Patient not taking: Reported on 10/11/2014 07/25/14   Dorie Rank,  MD  promethazine (PHENERGAN) 25 MG tablet Take 1 tablet (25 mg total) by mouth every 6 (six) hours as needed. 10/11/14   Nat Christen, MD   BP 111/71 mmHg  Pulse 109  Temp(Src) 98.1 F (36.7 C) (Oral)  Resp 16  Ht 4\' 11"  (1.499 m)  Wt 174 lb 3.2 oz (79.017 kg)  BMI 35.17 kg/m2  SpO2 100%  LMP 09/15/2014  Physical Exam  Constitutional: She is oriented to person, place, and time. She appears well-developed and well-nourished.  HENT:  Head: Normocephalic and atraumatic.  Eyes: Conjunctivae and EOM are normal. Pupils are equal, round, and reactive to light.  Neck: Normal range of motion. Neck supple.  Cardiovascular: Normal rate and regular rhythm.   Pulmonary/Chest: Effort normal and breath sounds normal.  Abdominal: Soft. Bowel sounds are normal.  Minimal periumbilical tenderness  Musculoskeletal: Normal range of motion.  Neurological: She is alert and oriented to person, place, and time.  Skin: Skin is warm and dry.  Psychiatric: She has a normal mood and affect. Her behavior is normal.  Nursing note and vitals reviewed.   ED Course  Procedures (including critical care time)  DIAGNOSTIC STUDIES: Oxygen Saturation is 100% on RA, normal by my interpretation.    COORDINATION OF CARE: 1:45 PM- Will order IV fluids. Pt advised of plan for treatment and pt agrees.  Labs Review Labs Reviewed  CBC WITH DIFFERENTIAL/PLATELET - Abnormal; Notable for the following:    RBC 5.35 (*)  RDW 16.7 (*)    Neutrophils Relative % 88 (*)    Neutro Abs 8.0 (*)    Lymphocytes Relative 6 (*)    Lymphs Abs 0.6 (*)    All other components within normal limits  COMPREHENSIVE METABOLIC PANEL - Abnormal; Notable for the following:    Glucose, Bld 126 (*)    All other components within normal limits  URINALYSIS, ROUTINE W REFLEX MICROSCOPIC - Abnormal; Notable for the following:    Ketones, ur 15 (*)    All other components within normal limits  POC URINE PREG, ED    Imaging Review No  results found.   EKG Interpretation None      MDM   Final diagnoses:  Gastroenteritis    No acute abdomen. Vital signs stable. Patient feels better after IV fluids and IV Zofran. Discharge medication Phenergan 25 mg.  I personally performed the services described in this documentation, which was scribed in my presence. The recorded information has been reviewed and is accurate.    Nat Christen, MD 10/11/14 [phone]

## 2015-04-19 ENCOUNTER — Encounter (HOSPITAL_COMMUNITY): Payer: Self-pay | Admitting: Cardiology

## 2015-04-19 ENCOUNTER — Emergency Department (HOSPITAL_COMMUNITY)
Admission: EM | Admit: 2015-04-19 | Discharge: 2015-04-19 | Disposition: A | Discharge status: Home / Self Care | Payer: Self-pay | Attending: Emergency Medicine | Admitting: Emergency Medicine

## 2015-04-19 DIAGNOSIS — Z79899 Other long term (current) drug therapy: Secondary | ICD-10-CM | POA: Insufficient documentation

## 2015-04-19 DIAGNOSIS — N760 Acute vaginitis: Secondary | ICD-10-CM | POA: Insufficient documentation

## 2015-04-19 DIAGNOSIS — N898 Other specified noninflammatory disorders of vagina: Secondary | ICD-10-CM

## 2015-04-19 DIAGNOSIS — Z3202 Encounter for pregnancy test, result negative: Secondary | ICD-10-CM | POA: Insufficient documentation

## 2015-04-19 LAB — URINALYSIS, ROUTINE W REFLEX MICROSCOPIC
Bilirubin Urine: NEGATIVE
GLUCOSE, UA: NEGATIVE mg/dL
Hgb urine dipstick: NEGATIVE
KETONES UR: NEGATIVE mg/dL
LEUKOCYTES UA: NEGATIVE
NITRITE: NEGATIVE
PROTEIN: NEGATIVE mg/dL
Specific Gravity, Urine: 1.025 (ref 1.005–1.030)
Urobilinogen, UA: 1 mg/dL (ref 0.0–1.0)
pH: 5.5 (ref 5.0–8.0)

## 2015-04-19 LAB — I-STAT CHEM 8, ED
BUN: 11 mg/dL (ref 6–20)
CHLORIDE: 105 mmol/L (ref 101–111)
Calcium, Ion: 0.96 mmol/L — ABNORMAL LOW (ref 1.12–1.23)
Creatinine, Ser: 0.6 mg/dL (ref 0.44–1.00)
GLUCOSE: 92 mg/dL (ref 65–99)
HEMATOCRIT: 46 % (ref 36.0–46.0)
HEMOGLOBIN: 15.6 g/dL — AB (ref 12.0–15.0)
POTASSIUM: 4.4 mmol/L (ref 3.5–5.1)
SODIUM: 138 mmol/L (ref 135–145)
TCO2: 24 mmol/L (ref 0–100)

## 2015-04-19 LAB — WET PREP, GENITAL
Clue Cells Wet Prep HPF POC: NONE SEEN
TRICH WET PREP: NONE SEEN
WBC, Wet Prep HPF POC: NONE SEEN
Yeast Wet Prep HPF POC: NONE SEEN

## 2015-04-19 LAB — POC URINE PREG, ED: PREG TEST UR: NEGATIVE

## 2015-04-19 MED ORDER — FLUCONAZOLE 150 MG PO TABS: 150.0000 mg | ORAL_TABLET | Freq: Once | ORAL | Status: DC

## 2015-04-19 NOTE — Discharge Instructions (Signed)
Avoid any harsh soaps or douches. Take diflucan as directed to treat a possible yeast infection. Avoid changes in condom brands or any other feminine products. Follow up with women's health outpatient clinic for ongoing management of recurrent vaginal itching. Follow up with [facility] to establish medical care. Return to the [facility] hospital emergency room (same building as the outpatient clinic) for any changes or worsening symptoms.  You were tested for Gonorrhea, Chlamydia, HIV, and Syphilis, and the hospital will call you if the lab is positive.    Vaginitis Vaginitis is an inflammation of the vagina. It can happen when the normal bacteria and yeast in the vagina grow too much. There are different types. Treatment will depend on the type you have. HOME CARE  Take all medicines as told by your doctor.  Keep your vagina area clean and dry. Avoid soap. Rinse the area with water.  Avoid washing and cleaning out the vagina (douching).  Do not use tampons or have sex (intercourse) until your treatment is done.  Wipe from front to back after going to the restroom.  Wear cotton underwear.  Avoid wearing underwear while you sleep until your vaginitis is gone.  Avoid tight pants. Avoid underwear or nylons without a cotton panel.  Take off wet clothing (such as a bathing suit) as soon as you can.  Use mild, unscented products. Avoid fabric softeners and scented:  Feminine sprays.  Laundry detergents.  Tampons.  Soaps or bubble baths.  Practice safe sex and use condoms. GET HELP RIGHT AWAY IF:   You have belly (abdominal) pain.  You have a fever or lasting symptoms for more than 2-3 days.  You have a fever and your symptoms suddenly get worse. MAKE SURE YOU:   Understand these instructions.  Will watch this condition.  Will get help right away if you are not doing well or get worse. Document Released: 10/09/2008 Document Revised: 04/06/2012 Document  Reviewed: 12/24/2011 [facility] Patient Information 2015 [place], [place]. This information is not intended to replace advice given to you by your health care provider. Make sure you discuss any questions you have with your health care provider.

## 2015-04-19 NOTE — ED Notes (Signed)
PA at bedside.

## 2015-04-19 NOTE — ED Provider Notes (Signed)
CSN: [id number]     Arrival date & time 04/19/15  1318 History   First MD Initiated Contact with Patient 04/19/15 1500     Chief Complaint  Patient presents with  . Vaginal Itching     (Consider location/radiation/quality/duration/timing/severity/associated sxs/prior Treatment) HPI Comments: Tara Stark is a 34 y.o. female with a PMHx of GERD, who presents to the ED with complaints of vaginal itching and burning 3 days. She states that this is a recurrent issue over the last several months. She reports the vaginal burning is 5/10 intermittent nonradiating pain worse with bathing and intercourse with no treatments tried prior to arrival. She was previously placed on an antibiotic treatment for a vaginal bacterial infection, but she did not complete the course. She denies this changed her symptoms. She denies any fevers, chills, chest pain, shortness breath, abdominal pain, nausea vomiting diarrhea constipation, dysuria, hematuria, vaginal discharge or bleeding, vaginal lesions, numbness, tingling, weakness, changes in soaps or detergents, changes in feminine hygiene products. LMP 8/10. Sexually active with one female partner, unprotected.  Patient is a 34 y.o. female presenting with vaginal itching. The history is provided by the patient. No language interpreter was used.  Vaginal Itching This is a recurrent problem. The current episode started in the past 7 days. The problem occurs constantly. The problem has been unchanged. Pertinent negatives include no abdominal pain, arthralgias, chest pain, chills, fever, myalgias, nausea, numbness, urinary symptoms, vomiting or weakness. Exacerbated by: bathing and intercourse. She has tried nothing for the symptoms. The treatment provided no relief.    Past Medical History  Diagnosis Date  . Acid indigestion    History reviewed. No pertinent past surgical history. History reviewed. No pertinent family history. Social History  Substance Use Topics   . Smoking status: Never Smoker   . Smokeless tobacco: None  . Alcohol Use: No   OB History    No data available     Review of Systems  Constitutional: Negative for fever and chills.  Respiratory: Negative for shortness of breath.   Cardiovascular: Negative for chest pain.  Gastrointestinal: Negative for nausea, vomiting, abdominal pain, diarrhea and constipation.  Genitourinary: Positive for vaginal pain (burning itching). Negative for dysuria, hematuria, vaginal bleeding, vaginal discharge and genital sores.  Musculoskeletal: Negative for myalgias and arthralgias.  Skin: Negative for color change.  Allergic/Immunologic: Negative for immunocompromised state.  Neurological: Negative for weakness and numbness.  Psychiatric/Behavioral: Negative for confusion.   10 Systems reviewed and are negative for acute change except as noted in the HPI.    Allergies  Review of patient's allergies indicates no known allergies.  Home Medications   Prior to Admission medications   Medication Sig Start Date End Date Taking? Authorizing Provider  Multiple Vitamins-Minerals (AIRBORNE PO) Take 1 tablet by mouth daily.    Historical Provider, MD  naproxen (NAPROSYN) 500 MG tablet Take 1 tablet (500 mg total) by mouth 2 (two) times daily with a meal. As needed for pain Patient not taking: Reported on 10/11/2014 07/25/14   Dorie Rank, MD  promethazine (PHENERGAN) 25 MG tablet Take 1 tablet (25 mg total) by mouth every 6 (six) hours as needed. 10/11/14   Nat Christen, MD   BP 148/95 mmHg  Pulse 99  Temp(Src) 98.1 F (36.7 C) (Oral)  Resp 18  Ht 5' (1.524 m)  SpO2 99%  LMP 03/26/2015 Physical Exam  Constitutional: She is oriented to person, place, and time. Vital signs are normal. She appears well-developed and well-nourished.  Non-toxic  appearance. No distress.  Afebrile, nontoxic, NAD  HENT:  Head: Normocephalic and atraumatic.  Mouth/Throat: Oropharynx is clear and moist and mucous membranes are  normal.  Eyes: Conjunctivae and EOM are normal. Right eye exhibits no discharge. Left eye exhibits no discharge.  Neck: Normal range of motion. Neck supple.  Cardiovascular: Normal rate, regular rhythm, normal heart sounds and intact distal pulses.  Exam reveals no gallop and no friction rub.   No murmur heard. Pulmonary/Chest: Effort normal and breath sounds normal. No respiratory distress. She has no decreased breath sounds. She has no wheezes. She has no rhonchi. She has no rales.  Abdominal: Soft. Normal appearance and bowel sounds are normal. She exhibits no distension. There is no tenderness. There is no rigidity, no rebound, no guarding, no CVA tenderness, no tenderness at McBurney's point and negative Murphy's sign.  Genitourinary: Uterus normal. Pelvic exam was performed with patient supine. There is no rash, tenderness or lesion on the right labia. There is no rash, tenderness or lesion on the left labia. Cervix exhibits no motion tenderness, no discharge and no friability. Right adnexum displays no mass, no tenderness and no fullness. Left adnexum displays no mass, no tenderness and no fullness. There is erythema in the vagina. No tenderness or bleeding in the vagina. No signs of injury around the vagina. No vaginal discharge found.  Chaperone present for exam. No rashes, lesions, or tenderness to external genitalia. No injury or tenderness to vaginal mucosa, but mild erythema to vaginal mucosa. No vaginal discharge or bleeding within vaginal vault. No adnexal masses, tenderness, or fullness. No CMT, cervical friability, or discharge from cervical os. Uterus non-deviated, mobile, nonTTP, and without enlargement.    Musculoskeletal: Normal range of motion.  Neurological: She is alert and oriented to person, place, and time. She has normal strength. No sensory deficit.  Skin: Skin is warm, dry and intact. No rash noted.  Psychiatric: She has a normal mood and affect.  Nursing note and vitals  reviewed.   ED Course  Procedures (including critical care time) Labs Review Labs Reviewed  WET PREP, GENITAL  URINALYSIS, ROUTINE W REFLEX MICROSCOPIC (NOT AT [facility])  RPR  HIV ANTIBODY (ROUTINE TESTING)  POC URINE PREG, ED  GC/CHLAMYDIA PROBE AMP ([place]) NOT AT [facility]    Imaging Review No results found. I have personally reviewed and evaluated these images and lab results as part of my medical decision-making.   EKG Interpretation None      MDM   Final diagnoses:  Vaginal itching  Vaginitis    34 y.o. female here with vaginal itching x3 days. Has had this issue recurrently in the last few months. On exam, no vaginal discharge or lesions, some mild vaginal mucosal erythema, could be yeast infection vs irritant vaginitis. No concern for GC/CT, will test STD panel but doubt need for empiric treatment. Will obtain wet prep and reassess shortly.   4:06 PM Upreg neg. U/A clear. Wet prep without evidence of yeast/bacteria/trich but given symptoms will treat with one time dose of diflucan. Will have her f/up with [facility] in 1wk to establish care, and with women's clinic for ongoing management of recurrent vaginal irritation. Discussed avoidance of harsh soaps/douches/etc. I explained the diagnosis and have given explicit precautions to return to the ER including for any other new or worsening symptoms. The patient understands and accepts the medical plan as it's been dictated and I have answered their questions. Discharge instructions concerning home care and prescriptions have been given. The  patient is STABLE and is discharged to home in good condition.  BP 148/95 mmHg  Pulse 99  Temp(Src) 98.1 F (36.7 C) (Oral)  Resp 18  Ht 5' (1.524 m)  SpO2 99%  LMP 03/26/2015  Meds ordered this encounter  Medications  . fluconazole (DIFLUCAN) 150 MG tablet    Sig: Take 1 tablet (150 mg total) by mouth once.    Dispense:  1 tablet    Refill:  0    Order Specific Question:   Supervising Provider    Answer:  Noemi Chapel [[id number]]      Nella Botsford Camprubi-Soms, PA-C 04/19/15 [place], MD 04/19/15 2319

## 2015-04-19 NOTE — ED Notes (Signed)
Pt reports vaginal itching and burning over the past couple of days. States she is concerned about possible STIs

## 2015-04-19 NOTE — ED Notes (Signed)
Pt able to dress independently and ambulate in hall.  Gait steady and even.

## 2015-04-20 LAB — HIV ANTIBODY (ROUTINE TESTING W REFLEX): HIV SCREEN 4TH GENERATION: NONREACTIVE

## 2015-04-20 LAB — RPR: RPR: NONREACTIVE

## 2015-04-22 LAB — GC/CHLAMYDIA PROBE AMP (~~LOC~~) NOT AT ARMC
Chlamydia: NEGATIVE
Neisseria Gonorrhea: NEGATIVE

## 2015-07-22 ENCOUNTER — Emergency Department (HOSPITAL_COMMUNITY)
Admission: EM | Admit: 2015-07-22 | Discharge: 2015-07-23 | Disposition: A | Discharge status: Home / Self Care | Payer: Self-pay | Attending: Emergency Medicine | Admitting: Emergency Medicine

## 2015-07-22 ENCOUNTER — Encounter (HOSPITAL_COMMUNITY): Payer: Self-pay | Admitting: Emergency Medicine

## 2015-07-22 DIAGNOSIS — Z79899 Other long term (current) drug therapy: Secondary | ICD-10-CM | POA: Insufficient documentation

## 2015-07-22 DIAGNOSIS — N939 Abnormal uterine and vaginal bleeding, unspecified: Secondary | ICD-10-CM

## 2015-07-22 DIAGNOSIS — Z0130 Encounter for examination of blood pressure without abnormal findings: Secondary | ICD-10-CM | POA: Insufficient documentation

## 2015-07-22 DIAGNOSIS — N938 Other specified abnormal uterine and vaginal bleeding: Secondary | ICD-10-CM | POA: Insufficient documentation

## 2015-07-22 DIAGNOSIS — Z8719 Personal history of other diseases of the digestive system: Secondary | ICD-10-CM | POA: Insufficient documentation

## 2015-07-22 LAB — I-STAT BETA HCG BLOOD, ED (MC, WL, AP ONLY): I-stat hCG, quantitative: <5 m[IU]/mL (ref <5)

## 2015-07-22 NOTE — ED Notes (Signed)
Patient here with complaint of vaginal spotting with clots present, mild tachycardia x2.5 weeks with HR around 100, and hypertension. Patient was attempting to donate plasma and the clinic told her that she would have to get medically cleared prior to donation with the above listed symptoms. LMP was near end of October. Patient reports she feels normal. Denies pain.

## 2015-07-22 NOTE — ED Provider Notes (Signed)
CSN: [id number]     Arrival date & time 07/22/15  1841 History   First MD Initiated Contact with Patient 07/22/15 2315     Chief Complaint  Patient presents with  . Vaginal Bleeding     (Consider location/radiation/quality/duration/timing/severity/associated sxs/prior Treatment) Patient is a 34 y.o. female presenting with vaginal bleeding. The history is provided by the patient and medical records.  Vaginal Bleeding   34 year old female with no significant past medical history presenting to the ED for vaginal spotting. Patient states she has a long-standing history of irregular menstrual cycles. She was started on birth control in 2014, however over the past few months her cycles have become irregular again. She states she did have some vaginal spotting earlier today that was bright red in color. She denies any pelvic pain, abdominal pain, nausea, vomiting, or diarrhea. Patient states her and her husband have been trying to get pregnant for several months now, she is planning to see an infertility specialist in January.  Patient also is concerned about her blood pressure. She states she has donated plasma 3 times recently, and had been told that her heart rate was elevated at last 2 visits. She states she also had blood pressure readings that were elevated. She has no history of hypertension or any cardiac issues. Patient is not a smoker. She denies any chest pain or shortness of breath. She denies any dizziness, weakness, or feelings of syncope.  Past Medical History  Diagnosis Date  . Acid indigestion    History reviewed. No pertinent past surgical history. History reviewed. No pertinent family history. Social History  Substance Use Topics  . Smoking status: Never Smoker   . Smokeless tobacco: None  . Alcohol Use: No   OB History    No data available     Review of Systems  Genitourinary: Positive for vaginal bleeding.  All other systems reviewed and are  negative.     Allergies  Review of patient's allergies indicates no known allergies.  Home Medications   Prior to Admission medications   Medication Sig Start Date End Date Taking? Authorizing Provider  fluconazole (DIFLUCAN) 150 MG tablet Take 1 tablet (150 mg total) by mouth once. 04/19/15   Mercedes Camprubi-Soms, PA-C  naproxen (NAPROSYN) 500 MG tablet Take 1 tablet (500 mg total) by mouth 2 (two) times daily with a meal. As needed for pain Patient not taking: Reported on 10/11/2014 07/25/14   Dorie Rank, MD  promethazine (PHENERGAN) 25 MG tablet Take 1 tablet (25 mg total) by mouth every 6 (six) hours as needed. 10/11/14   Nat Christen, MD   BP 136/91 mmHg  Pulse 82  Temp(Src) 98.7 F (37.1 C) (Oral)  Resp 18  SpO2 99%  LMP 05/22/2015 (Approximate)   Physical Exam  Constitutional: She is oriented to person, place, and time. She appears well-developed and well-nourished. No distress.  HENT:  Head: Normocephalic and atraumatic.  Mouth/Throat: Oropharynx is clear and moist.  Eyes: Conjunctivae and EOM are normal. Pupils are equal, round, and reactive to light.  Neck: Normal range of motion. Neck supple.  Cardiovascular: Normal rate, regular rhythm, normal heart sounds, intact distal pulses and normal pulses.   Pulmonary/Chest: Effort normal and breath sounds normal. No respiratory distress. She has no wheezes.  Abdominal: Soft. Bowel sounds are normal. There is no tenderness. There is no guarding.  Musculoskeletal: Normal range of motion.  Neurological: She is alert and oriented to person, place, and time.  Skin: Skin is warm and dry.  She is not diaphoretic.  Psychiatric: She has a normal mood and affect.  Nursing note and vitals reviewed.   ED Course  Procedures (including critical care time) Labs Review Labs Reviewed  CBC WITH DIFFERENTIAL/PLATELET - Abnormal; Notable for the following:    RDW 17.2 (*)    All other components within normal limits  BASIC METABOLIC PANEL   I-STAT BETA HCG BLOOD, ED ([facility], [facility], [facility] ONLY)    Imaging Review No results found. I have personally reviewed and evaluated these images and lab results as part of my medical decision-making.   EKG Interpretation None      MDM   Final diagnoses:  Vaginal bleeding  Blood pressure check   34 year old female here with vaginal spotting. Patient has history of irregular menstrual cycles.  Patient is afebrile, nontoxic. She denies any abdominal pain and exam is benign.  This is likely onset of her menses.  Without pain, do not feel further work-up indicated.  Patient also is concerned about her blood pressure and pulse as these have been elevated when she goes to try to donate plasma.  Husband reports she does get anxious.  Patient denies chest pain or SOB.  No hx of HTN or cardiac hx.  EKG sinus rhythm.  Lab work reassuring.  BP was initially elevated on arrival, however has remained WNL since this time without intervention.  Doubt acute cardiac pathology including ACS, PE, or dissection.  Patient discharged home.  She has upcoming appt with her OB-GYN on 08/13/15.  She was given information for women's hospital if she needs earlier/emergent follow-up.  Discussed plan with patient, he/she acknowledged understanding and agreed with plan of care.  Return precautions given for new or worsening symptoms.  Larene Pickett, PA-C 07/23/15 0125  Harvel Quale, MD 07/23/15 [phone]

## 2015-07-23 ENCOUNTER — Encounter (HOSPITAL_COMMUNITY): Payer: Self-pay | Admitting: *Deleted

## 2015-07-23 ENCOUNTER — Inpatient Hospital Stay (HOSPITAL_COMMUNITY)
Admission: AD | Admit: 2015-07-23 | Discharge: 2015-07-23 | Disposition: A | Discharge status: Home / Self Care | Payer: Self-pay | Source: Ambulatory Visit | Attending: Family Medicine | Admitting: Family Medicine

## 2015-07-23 DIAGNOSIS — K625 Hemorrhage of anus and rectum: Secondary | ICD-10-CM | POA: Insufficient documentation

## 2015-07-23 HISTORY — DX: Unspecified hemorrhoids: K64.9

## 2015-07-23 LAB — BASIC METABOLIC PANEL
Anion gap: 10 (ref 5–15)
BUN: 7 mg/dL (ref 6–20)
CHLORIDE: 108 mmol/L (ref 101–111)
CO2: 26 mmol/L (ref 22–32)
Calcium: 8.9 mg/dL (ref 8.9–10.3)
Creatinine, Ser: 0.55 mg/dL (ref 0.44–1.00)
GFR calc non Af Amer: >60 mL/min (ref >60)
Glucose, Bld: 83 mg/dL (ref 65–99)
POTASSIUM: 3.9 mmol/L (ref 3.5–5.1)
SODIUM: 144 mmol/L (ref 135–145)

## 2015-07-23 LAB — URINALYSIS, ROUTINE W REFLEX MICROSCOPIC
Bilirubin Urine: NEGATIVE
Glucose, UA: NEGATIVE mg/dL
Hgb urine dipstick: NEGATIVE
Ketones, ur: NEGATIVE mg/dL
LEUKOCYTES UA: NEGATIVE
NITRITE: NEGATIVE
Protein, ur: NEGATIVE mg/dL
pH: 6 (ref 5.0–8.0)

## 2015-07-23 LAB — CBC WITH DIFFERENTIAL/PLATELET
BASOS PCT: 0 %
Basophils Absolute: 0 10*3/uL (ref 0.0–0.1)
EOS ABS: 0.1 10*3/uL (ref 0.0–0.7)
Eosinophils Relative: 1 %
HEMATOCRIT: 41.2 % (ref 36.0–46.0)
HEMOGLOBIN: 13.1 g/dL (ref 12.0–15.0)
LYMPHS ABS: 3.2 10*3/uL (ref 0.7–4.0)
Lymphocytes Relative: 35 %
MCH: 26.1 pg (ref 26.0–34.0)
MCHC: 31.8 g/dL (ref 30.0–36.0)
MCV: 82.1 fL (ref 78.0–100.0)
Monocytes Absolute: 0.7 10*3/uL (ref 0.1–1.0)
Monocytes Relative: 8 %
NEUTROS ABS: 5.1 10*3/uL (ref 1.7–7.7)
NEUTROS PCT: 56 %
Platelets: 263 10*3/uL (ref 150–400)
RBC: 5.02 MIL/uL (ref 3.87–5.11)
RDW: 17.2 % — ABNORMAL HIGH (ref 11.5–15.5)
WBC: 9.1 10*3/uL (ref 4.0–10.5)

## 2015-07-23 NOTE — MAU Provider Note (Signed)
History   nullparous pt in with c/o rectal bleeding for several weeks. States bleeding is bright with bowel movement.  CSN: [id number]  Arrival date & time 07/23/15  1648   First Provider Initiated Contact with Patient 07/23/15 1800      Chief Complaint  Patient presents with  . Rectal Bleeding    HPI  Past Medical History  Diagnosis Date  . Acid indigestion   . Hemorrhoids     Past Surgical History  Procedure Laterality Date  . No past surgeries      History reviewed. No pertinent family history.  Social History  Substance Use Topics  . Smoking status: Never Smoker   . Smokeless tobacco: None  . Alcohol Use: No    OB History    Gravida Para Term Preterm AB TAB SAB Ectopic Multiple Living   0 0 0 0 0 0 0 0 0 0       Review of Systems  Constitutional: Negative.   HENT: Negative.   Eyes: Negative.   Respiratory: Negative.   Cardiovascular: Negative.   Gastrointestinal: Positive for anal bleeding.  Endocrine: Negative.   Genitourinary: Negative.   Musculoskeletal: Negative.   Skin: Negative.   Allergic/Immunologic: Negative.   Hematological: Negative.   Psychiatric/Behavioral: Negative.     Allergies  Review of patient's allergies indicates no known allergies.  Home Medications  No current outpatient prescriptions on file.  BP 128/88 mmHg  Pulse 89  Temp(Src) 97.9 F (36.6 C) (Oral)  Resp 18  Ht 4' 11.5" (1.511 m)  Wt 183 lb (83.008 kg)  BMI 36.36 kg/m2  LMP 05/22/2015 (Approximate)  Physical Exam  Constitutional: She is oriented to person, place, and time. She appears well-developed and well-nourished.  HENT:  Head: Normocephalic.  Eyes: Pupils are equal, round, and reactive to light.  Neck: Normal range of motion.  Cardiovascular: Normal rate, regular rhythm, normal heart sounds and intact distal pulses.   Pulmonary/Chest: Effort normal and breath sounds normal.  Abdominal: Soft. Bowel sounds are normal.  Genitourinary: Vagina normal.   Musculoskeletal: Normal range of motion.  Neurological: She is alert and oriented to person, place, and time. She has normal reflexes.  Skin: Skin is warm and dry.  Psychiatric: She has a normal mood and affect. Her behavior is normal. Judgment and thought content normal.    MAU Course  Procedures (including critical care time)  Labs Reviewed  URINALYSIS, [place] (NOT AT [facility])   No results found.   No diagnosis found.    MDM  With exam there is no active bleeding, but there are several sm non thrombosed hemorrhoids. Discussed high fiber died ant stool softners q hs. Will d/c home

## 2015-07-23 NOTE — ED Notes (Signed)
EDP at bedside  

## 2015-07-23 NOTE — ED Notes (Signed)
Called mini lab, informed that tubes needed to be sent down for pt CBC and BMP

## 2015-07-23 NOTE — Discharge Instructions (Signed)
Follow-up with women's hospital or you may wait and see your OB-GYN as scheduled. Return to the ED for new or worsening symptoms.

## 2015-07-23 NOTE — MAU Note (Signed)
Noted blood when had bowel movements today, was a lot of blood.  Has not been constipated, denies hard stool or straining.  Was at [facility] yesterday for vag bleeding.  Had bright red bright red bleeding and passed a clot, they did some tests and everything was ok.  Also has been trying to get preg and would like to get that checked out.  Has been trying since July, explained our function here and that we do not do infertility testing/treatment.

## 2015-07-23 NOTE — Discharge Instructions (Signed)

## 2015-08-12 ENCOUNTER — Encounter: Payer: Self-pay | Admitting: Obstetrics and Gynecology

## 2015-10-20 ENCOUNTER — Emergency Department (HOSPITAL_COMMUNITY): Payer: Self-pay

## 2015-10-20 ENCOUNTER — Encounter (HOSPITAL_COMMUNITY): Payer: Self-pay | Admitting: Emergency Medicine

## 2015-10-20 ENCOUNTER — Emergency Department (HOSPITAL_COMMUNITY)
Admission: EM | Admit: 2015-10-20 | Discharge: 2015-10-20 | Disposition: A | Discharge status: Home / Self Care | Payer: Self-pay | Attending: Emergency Medicine | Admitting: Emergency Medicine

## 2015-10-20 DIAGNOSIS — Z8719 Personal history of other diseases of the digestive system: Secondary | ICD-10-CM | POA: Insufficient documentation

## 2015-10-20 DIAGNOSIS — J069 Acute upper respiratory infection, unspecified: Secondary | ICD-10-CM | POA: Insufficient documentation

## 2015-10-20 MED ORDER — GUAIFENESIN 100 MG/5ML PO LIQD: 100.0000 mg | ORAL | Status: DC | PRN

## 2015-10-20 MED ORDER — ALBUTEROL SULFATE HFA 108 (90 BASE) MCG/ACT IN AERS
2.0000 | INHALATION_SPRAY | Freq: Once | RESPIRATORY_TRACT | Status: AC
  Administered 2015-10-20: 2 via RESPIRATORY_TRACT
  Filled 2015-10-20: qty 6.7

## 2015-10-20 MED ORDER — BENZONATATE 100 MG PO CAPS: 100.0000 mg | ORAL_CAPSULE | Freq: Three times a day (TID) | ORAL | Status: DC | PRN

## 2015-10-20 MED ORDER — IBUPROFEN 800 MG PO TABS: 800.0000 mg | ORAL_TABLET | Freq: Three times a day (TID) | ORAL | Status: DC | PRN

## 2015-10-20 NOTE — ED Provider Notes (Signed)
CSN: [id number]     Arrival date & time 10/20/15  0105 History   First MD Initiated Contact with Patient 10/20/15 [phone]     Chief Complaint  Patient presents with  . URI     (Consider location/radiation/quality/duration/timing/severity/associated sxs/prior Treatment) The history is provided by the patient.   Pt presents with five days of sore throat, cough, body aches, mild SOB, some pain just with coughing.  She does smoke.  Has taken theraflu without relief.  Has had sick contacts with similar symptoms.  Denies getting flu vaccination this season.    Past Medical History  Diagnosis Date  . Acid indigestion   . Hemorrhoids    Past Surgical History  Procedure Laterality Date  . No past surgeries     History reviewed. No pertinent family history. Social History  Substance Use Topics  . Smoking status: Never Smoker   . Smokeless tobacco: None  . Alcohol Use: No   OB History    Gravida Para Term Preterm AB TAB SAB Ectopic Multiple Living   0 0 0 0 0 0 0 0 0 0      Review of Systems  All other systems reviewed and are negative.     Allergies  Review of patient's allergies indicates no known allergies.  Home Medications   Prior to Admission medications   Medication Sig Start Date End Date Taking? Authorizing Provider  fluconazole (DIFLUCAN) 150 MG tablet Take 1 tablet (150 mg total) by mouth once. Patient not taking: Reported on 10/20/2015 04/19/15   Mercedes Camprubi-Soms, PA-C  promethazine (PHENERGAN) 25 MG tablet Take 1 tablet (25 mg total) by mouth every 6 (six) hours as needed. Patient not taking: Reported on 10/20/2015 10/11/14   Nat Christen, MD   BP 128/81 mmHg  Pulse 115  Temp(Src) 98.3 F (36.8 C) (Oral)  Resp 20  Ht 4\' 11"  (1.499 m)  SpO2 95%  LMP 10/20/2015 Physical Exam  Constitutional: She appears well-developed and well-nourished. No distress.  HENT:  Head: Normocephalic and atraumatic.  Mouth/Throat: Oropharynx is clear and moist. No oropharyngeal  exudate.  Eyes: Conjunctivae are normal.  Neck: Neck supple.  Cardiovascular: Normal rate and regular rhythm.   Pulmonary/Chest: Effort normal. No respiratory distress. She has wheezes (mild occasional wheeze). She has no rales.  Neurological: She is alert.  Skin: She is not diaphoretic.  Nursing note and vitals reviewed.   ED Course  Procedures (including critical care time) Labs Review Labs Reviewed - No data to display  Imaging Review Dg Chest 2 View  10/20/2015  CLINICAL DATA:  Cough and chills for 2 days.  Smoker. EXAM: CHEST  2 VIEW COMPARISON:  None. FINDINGS: The heart size and mediastinal contours are within normal limits. Both lungs are clear. The visualized skeletal structures are unremarkable. IMPRESSION: No active cardiopulmonary disease. Electronically Signed   By: Lucienne Capers M.D.   On: 10/20/2015 02:35   I have personally reviewed and evaluated these images and lab results as part of my medical decision-making.   EKG Interpretation None      MDM   Final diagnoses:  URI (upper respiratory infection)    Afebrile, nontoxic patient with constellation of symptoms suggestive of viral syndrome.  No concerning findings on exam.  Discharged home with supportive care, PCP follow up.  Discussed result, findings, treatment, and follow up  with patient.  Pt given return precautions.  Pt verbalizes understanding and agrees with plan.        Clayton Bibles, PA-C 10/20/15  [id number]  Everlene Balls, MD 10/20/15 1630

## 2015-10-20 NOTE — ED Notes (Signed)
Patient left ED at this time.

## 2015-10-20 NOTE — Discharge Instructions (Signed)
Read the information below.  Use the prescribed medication as directed.  Please discuss all new medications with your pharmacist.  You may return to the Emergency Department at any time for worsening condition or any new symptoms that concern you.    If you develop high fevers that do not resolve with tylenol or ibuprofen, you have difficulty swallowing or breathing, or you are unable to tolerate fluids by mouth, return to the ER for a recheck.    ° ° °Upper Respiratory Infection, Adult °Most upper respiratory infections (URIs) are a viral infection of the air passages leading to the lungs. A URI affects the nose, throat, and upper air passages. The most common type of URI is nasopharyngitis and is typically referred to as "the common cold." °URIs run their course and usually go away on their own. Most of the time, a URI does not require medical attention, but sometimes a bacterial infection in the upper airways can follow a viral infection. This is called a secondary infection. Sinus and middle ear infections are common types of secondary upper respiratory infections. °Bacterial pneumonia can also complicate a URI. A URI can worsen asthma and chronic obstructive pulmonary disease (COPD). Sometimes, these complications can require emergency medical care and may be life threatening.  °CAUSES °Almost all URIs are caused by viruses. A virus is a type of germ and can spread from one person to another.  °RISKS FACTORS °You may be at risk for a URI if:  °· You smoke.   °· You have chronic heart or lung disease. °· You have a weakened defense (immune) system.   °· You are very young or very old.   °· You have nasal allergies or asthma. °· You work in crowded or poorly ventilated areas. °· You work in health care facilities or schools. °SIGNS AND SYMPTOMS  °Symptoms typically develop 2-3 days after you come in contact with a cold virus. Most viral URIs last 7-10 days. However, viral URIs from the influenza virus (flu virus)  can last 14-18 days and are typically more severe. Symptoms may include:  °· Runny or stuffy (congested) nose.   °· Sneezing.   °· Cough.   °· Sore throat.   °· Headache.   °· Fatigue.   °· Fever.   °· Loss of appetite.   °· Pain in your forehead, behind your eyes, and over your cheekbones (sinus pain). °· Muscle aches.   °DIAGNOSIS  °Your health care provider may diagnose a URI by: °· Physical exam. °· Tests to check that your symptoms are not due to another condition such as: °¨ Strep throat. °¨ Sinusitis. °¨ Pneumonia. °¨ Asthma. °TREATMENT  °A URI goes away on its own with time. It cannot be cured with medicines, but medicines may be prescribed or recommended to relieve symptoms. Medicines may help: °· Reduce your fever. °· Reduce your cough. °· Relieve nasal congestion. °HOME CARE INSTRUCTIONS  °· Take medicines only as directed by your health care provider.   °· Gargle warm saltwater or take cough drops to comfort your throat as directed by your health care provider. °· Use a warm mist humidifier or inhale steam from a shower to increase air moisture. This may make it easier to breathe. °· Drink enough fluid to keep your urine clear or pale yellow.   °· Eat soups and other clear broths and maintain good nutrition.   °· Rest as needed.   °· Return to work when your temperature has returned to normal or as your health care provider advises. You may need to stay home longer to   avoid infecting others. You can also use a face mask and careful hand washing to prevent spread of the virus. °· Increase the usage of your inhaler if you have asthma.   °· Do not use any tobacco products, including cigarettes, chewing tobacco, or electronic cigarettes. If you need help quitting, ask your health care provider. °PREVENTION  °The best way to protect yourself from getting a cold is to practice good hygiene.  °· Avoid oral or hand contact with people with cold symptoms.   °· Wash your hands often if contact occurs.   °There is  no clear evidence that vitamin C, vitamin E, echinacea, or exercise reduces the chance of developing a cold. However, it is always recommended to get plenty of rest, exercise, and practice good nutrition.  °SEEK MEDICAL CARE IF:  °· You are getting worse rather than better.   °· Your symptoms are not controlled by medicine.   °· You have chills. °· You have worsening shortness of breath. °· You have brown or red mucus. °· You have yellow or brown nasal discharge. °· You have pain in your face, especially when you bend forward. °· You have a fever. °· You have swollen neck glands. °· You have pain while swallowing. °· You have white areas in the back of your throat. °SEEK IMMEDIATE MEDICAL CARE IF:  °· You have severe or persistent: °¨ Headache. °¨ Ear pain. °¨ Sinus pain. °¨ Chest pain. °· You have chronic lung disease and any of the following: °¨ Wheezing. °¨ Prolonged cough. °¨ Coughing up blood. °¨ A change in your usual mucus. °· You have a stiff neck. °· You have changes in your: °¨ Vision. °¨ Hearing. °¨ Thinking. °¨ Mood. °MAKE SURE YOU:  °· Understand these instructions. °· Will watch your condition. °· Will get help right away if you are not doing well or get worse. °  °This information is not intended to replace advice given to you by your health care provider. Make sure you discuss any questions you have with your health care provider. °  °Document Released: 01/06/2001 Document Revised: 11/27/2014 Document Reviewed: 10/18/2013 °Elsevier Interactive Patient Education ©2016 Elsevier Inc[employer] ° °

## 2015-10-20 NOTE — ED Notes (Signed)
Patient with cold that has been going on for 5 days.  Patient states that she has a strong cough and the cold has moved into her chest.

## 2015-12-24 ENCOUNTER — Emergency Department (HOSPITAL_COMMUNITY): Payer: Self-pay

## 2015-12-24 ENCOUNTER — Emergency Department (HOSPITAL_COMMUNITY)
Admission: EM | Admit: 2015-12-24 | Discharge: 2015-12-24 | Disposition: A | Discharge status: Home / Self Care | Payer: Self-pay | Attending: Emergency Medicine | Admitting: Emergency Medicine

## 2015-12-24 ENCOUNTER — Encounter (HOSPITAL_COMMUNITY): Payer: Self-pay | Admitting: Emergency Medicine

## 2015-12-24 DIAGNOSIS — S40022A Contusion of left upper arm, initial encounter: Secondary | ICD-10-CM | POA: Insufficient documentation

## 2015-12-24 DIAGNOSIS — Y939 Activity, unspecified: Secondary | ICD-10-CM | POA: Insufficient documentation

## 2015-12-24 DIAGNOSIS — W19XXXA Unspecified fall, initial encounter: Secondary | ICD-10-CM | POA: Insufficient documentation

## 2015-12-24 DIAGNOSIS — Y999 Unspecified external cause status: Secondary | ICD-10-CM | POA: Insufficient documentation

## 2015-12-24 DIAGNOSIS — Y929 Unspecified place or not applicable: Secondary | ICD-10-CM | POA: Insufficient documentation

## 2015-12-24 MED ORDER — IBUPROFEN 800 MG PO TABS
800.0000 mg | ORAL_TABLET | Freq: Once | ORAL | Status: AC
  Administered 2015-12-24: 800 mg via ORAL
  Filled 2015-12-24: qty 1

## 2015-12-24 MED ORDER — ACETAMINOPHEN 325 MG PO TABS
650.0000 mg | ORAL_TABLET | Freq: Once | ORAL | Status: AC
  Administered 2015-12-24: 650 mg via ORAL
  Filled 2015-12-24: qty 2

## 2015-12-24 NOTE — Discharge Instructions (Signed)
Please apply ice to the affected area. Elevate arm is much as possible. Use 600 mg of ibuprofen, and or 500 mg of Tylenol every 6 hours for soreness and discomfort. Please see the orthopedic specialist listed above if not improving. Contusion A contusion is a deep bruise. Contusions happen when an injury causes bleeding under the skin. Symptoms of bruising include pain, swelling, and discolored skin. The skin may turn blue, purple, or yellow. HOME CARE   Rest the injured area.  If told, put ice on the injured area.  Put ice in a plastic bag.  Place a towel between your skin and the bag.  Leave the ice on for 20 minutes, 2-3 times per day.  If told, put light pressure (compression) on the injured area using an elastic bandage. Make sure the bandage is not too tight. Remove it and put it back on as told by your doctor.  If possible, raise (elevate) the injured area above the level of your heart while you are sitting or lying down.  Take over-the-counter and prescription medicines only as told by your doctor. GET HELP IF:  Your symptoms do not get better after several days of treatment.  Your symptoms get worse.  You have trouble moving the injured area. GET HELP RIGHT AWAY IF:   You have very bad pain.  You have a loss of feeling (numbness) in a hand or foot.  Your hand or foot turns pale or cold.   This information is not intended to replace advice given to you by your health care provider. Make sure you discuss any questions you have with your health care provider.   Document Released: 12/30/2007 Document Revised: 04/03/2015 Document Reviewed: 11/28/2014 Elsevier Interactive Patient Education [employer].

## 2015-12-24 NOTE — ED Provider Notes (Signed)
CSN: [id number]     Arrival date & time 12/24/15  1424 History   First MD Initiated Contact with Patient 12/24/15 1534     Chief Complaint  Patient presents with  . Arm Injury     (Consider location/radiation/quality/duration/timing/severity/associated sxs/prior Treatment) Patient is a 35 y.o. female presenting with arm injury. The history is provided by the patient.  Arm Injury Location:  Arm Time since incident: just prior to ED admission. Arm location:  L forearm Pain details:    Quality:  Aching   Severity:  Moderate   Onset quality:  Sudden   Timing:  Intermittent   Progression:  Worsening Chronicity:  New Handedness:  Right-handed Dislocation: no   Relieved by:  Nothing Worsened by:  Movement Ineffective treatments:  None tried Associated symptoms: stiffness   Associated symptoms: no decreased range of motion     Past Medical History  Diagnosis Date  . Acid indigestion   . Hemorrhoids    Past Surgical History  Procedure Laterality Date  . No past surgeries     No family history on file. Social History  Substance Use Topics  . Smoking status: Never Smoker   . Smokeless tobacco: None  . Alcohol Use: No   OB History    Gravida Para Term Preterm AB TAB SAB Ectopic Multiple Living   0 0 0 0 0 0 0 0 0 0      Review of Systems  Musculoskeletal: Positive for stiffness.      Allergies  Review of patient's allergies indicates no known allergies.  Home Medications   Prior to Admission medications   Medication Sig Start Date End Date Taking? Authorizing Provider  benzonatate (TESSALON) 100 MG capsule Take 1 capsule (100 mg total) by mouth 3 (three) times daily as needed for cough. 10/20/15   Clayton Bibles, PA-C  fluconazole (DIFLUCAN) 150 MG tablet Take 1 tablet (150 mg total) by mouth once. Patient not taking: Reported on 10/20/2015 04/19/15   Mercedes Camprubi-Soms, PA-C  guaiFENesin (ROBITUSSIN) 100 MG/5ML liquid Take 5-10 mLs (100-200 mg total) by mouth  every 4 (four) hours as needed for cough. 10/20/15   Clayton Bibles, PA-C  ibuprofen (ADVIL,MOTRIN) 800 MG tablet Take 1 tablet (800 mg total) by mouth every 8 (eight) hours as needed for mild pain or moderate pain. 10/20/15   Clayton Bibles, PA-C  promethazine (PHENERGAN) 25 MG tablet Take 1 tablet (25 mg total) by mouth every 6 (six) hours as needed. Patient not taking: Reported on 10/20/2015 10/11/14   Nat Christen, MD   BP 119/85 mmHg  Pulse 79  Temp(Src) 97.6 F (36.4 C) (Oral)  Resp 16  Ht 4\' 11"  (1.499 m)  Wt 81.647 kg  BMI 36.34 kg/m2  SpO2 100%  LMP 12/08/2015 Physical Exam  Constitutional: She is oriented to person, place, and time. She appears well-developed and well-nourished.  Non-toxic appearance.  HENT:  Head: Normocephalic.  Right Ear: Tympanic membrane and external ear normal.  Left Ear: Tympanic membrane and external ear normal.  Eyes: EOM and lids are normal. Pupils are equal, round, and reactive to light.  Neck: Normal range of motion. Neck supple. Carotid bruit is not present.  Cardiovascular: Normal rate, regular rhythm, normal heart sounds, intact distal pulses and normal pulses.   Pulmonary/Chest: Breath sounds normal. No respiratory distress.  Abdominal: Soft. Bowel sounds are normal. There is no tenderness. There is no guarding.  Musculoskeletal: Normal range of motion.       Left shoulder: Normal.  Left elbow: Normal.       Left forearm: She exhibits tenderness. She exhibits no deformity.       Arms: Lymphadenopathy:       Head (right side): No submandibular adenopathy present.       Head (left side): No submandibular adenopathy present.    She has no cervical adenopathy.  Neurological: She is alert and oriented to person, place, and time. She has normal strength. No cranial nerve deficit or sensory deficit.  Skin: Skin is warm and dry.  Psychiatric: She has a normal mood and affect. Her speech is normal.  Nursing note and vitals reviewed.   ED Course    Procedures (including critical care time) Labs Review Labs Reviewed - No data to display  Imaging Review Dg Forearm Left  12/24/2015  CLINICAL DATA:  Trip and fall today with forearm pain, initial encounter EXAM: LEFT FOREARM - 2 VIEW COMPARISON:  None. FINDINGS: There is no evidence of fracture or other focal bone lesions. Soft tissues are unremarkable. IMPRESSION: No acute abnormality noted. Electronically Signed   By: Inez Catalina M.D.   On: 12/24/2015 15:03   I have personally reviewed and evaluated these images and lab results as part of my medical decision-making.   EKG Interpretation None      MDM  Vital signs within normal limits. X-ray of the left forearm is negative for fracture or dislocation.  I discussed the x-rays with the patient in terms which he understands. I've discussed the exam findings with the patient. The patient is provided an ice pack. She's advised to use Tylenol and ibuprofen for soreness. The patient is to return if any changes, problems, or concerns.    Final diagnoses:  Contusion of arm, left, initial encounter    *I have reviewed nursing notes, vital signs, and all appropriate lab and imaging results for this patient.[address], PA-C 12/24/15 1625  Merrily Pew, MD 12/24/15 [phone]

## 2015-12-24 NOTE — ED Notes (Signed)
Pt reports falling earlier today onto LT arm. Pt reports pain, especially with movement. No obvious deformity noted.

## 2016-01-20 ENCOUNTER — Emergency Department (HOSPITAL_COMMUNITY)
Admission: EM | Admit: 2016-01-20 | Discharge: 2016-01-20 | Disposition: A | Discharge status: Home / Self Care | Payer: Self-pay | Attending: Emergency Medicine | Admitting: Emergency Medicine

## 2016-01-20 ENCOUNTER — Encounter (HOSPITAL_COMMUNITY): Payer: Self-pay | Admitting: Emergency Medicine

## 2016-01-20 DIAGNOSIS — R079 Chest pain, unspecified: Secondary | ICD-10-CM | POA: Insufficient documentation

## 2016-01-20 DIAGNOSIS — R110 Nausea: Secondary | ICD-10-CM | POA: Insufficient documentation

## 2016-01-20 DIAGNOSIS — R109 Unspecified abdominal pain: Secondary | ICD-10-CM

## 2016-01-20 DIAGNOSIS — R1030 Lower abdominal pain, unspecified: Secondary | ICD-10-CM | POA: Insufficient documentation

## 2016-01-20 DIAGNOSIS — Z791 Long term (current) use of non-steroidal anti-inflammatories (NSAID): Secondary | ICD-10-CM | POA: Insufficient documentation

## 2016-01-20 DIAGNOSIS — R197 Diarrhea, unspecified: Secondary | ICD-10-CM | POA: Insufficient documentation

## 2016-01-20 DIAGNOSIS — Z79899 Other long term (current) drug therapy: Secondary | ICD-10-CM | POA: Insufficient documentation

## 2016-01-20 LAB — CBC
HEMATOCRIT: 40.6 % (ref 36.0–46.0)
Hemoglobin: 13.2 g/dL (ref 12.0–15.0)
MCH: 26.6 pg (ref 26.0–34.0)
MCHC: 32.5 g/dL (ref 30.0–36.0)
MCV: 81.9 fL (ref 78.0–100.0)
PLATELETS: 288 10*3/uL (ref 150–400)
RBC: 4.96 MIL/uL (ref 3.87–5.11)
RDW: 16.4 % — ABNORMAL HIGH (ref 11.5–15.5)
WBC: 8 10*3/uL (ref 4.0–10.5)

## 2016-01-20 LAB — COMPREHENSIVE METABOLIC PANEL
ALT: 23 U/L (ref 14–54)
AST: 20 U/L (ref 15–41)
Albumin: 3.7 g/dL (ref 3.5–5.0)
Alkaline Phosphatase: 38 U/L (ref 38–126)
Anion gap: 5 (ref 5–15)
BUN: 11 mg/dL (ref 6–20)
CHLORIDE: 106 mmol/L (ref 101–111)
CO2: 28 mmol/L (ref 22–32)
Calcium: 8.3 mg/dL — ABNORMAL LOW (ref 8.9–10.3)
Creatinine, Ser: 0.55 mg/dL (ref 0.44–1.00)
Glucose, Bld: 101 mg/dL — ABNORMAL HIGH (ref 65–99)
Potassium: 3.7 mmol/L (ref 3.5–5.1)
Sodium: 139 mmol/L (ref 135–145)
TOTAL PROTEIN: 6.7 g/dL (ref 6.5–8.1)
Total Bilirubin: 0.6 mg/dL (ref 0.3–1.2)

## 2016-01-20 LAB — URINALYSIS, ROUTINE W REFLEX MICROSCOPIC
BILIRUBIN URINE: NEGATIVE
Glucose, UA: NEGATIVE mg/dL
Hgb urine dipstick: NEGATIVE
Leukocytes, UA: NEGATIVE
NITRITE: NEGATIVE
PROTEIN: NEGATIVE mg/dL
SPECIFIC GRAVITY, URINE: 1.015 (ref 1.005–1.030)
pH: 7 (ref 5.0–8.0)

## 2016-01-20 LAB — LIPASE, BLOOD: Lipase: 71 U/L — ABNORMAL HIGH (ref 11–51)

## 2016-01-20 LAB — PREGNANCY, URINE: PREG TEST UR: NEGATIVE

## 2016-01-20 NOTE — ED Notes (Addendum)
Pt reports lower abd pain and intermittent burning sensation in chest and diarrhea. Pt reports LMP last week with heavy "clots". Pt denies any urinary symptoms.

## 2016-01-20 NOTE — ED Provider Notes (Signed)
CSN: [id number]     Arrival date & time 01/20/16  [id number] History  By signing my name below, I, Irene Pap, attest that this documentation has been prepared under the direction and in the presence of Merrily Pew, MD. Electronically Signed: Irene Pap, ED Scribe. 01/20/2016. 9:55 AM.   Chief Complaint  Patient presents with  . Abdominal Pain   The history is provided by the patient. No language interpreter was used.  HPI Comments: Tara Stark is a 35 y.o. Female with a hx of acid indigestion who presents to the Emergency Department complaining of sharp, gradually worsening, sudden onset, intermittent lower abdominal pain onset 1 hour ago. Pt reports associated an intermittent burning sensation to the chest, nausea, and diarrhea that resolved yesterday. Pt states that her episodes of pain will last 5 minutes at a time. She reports that she has been having a lot of clots during her menstrual period. Pt is not on BC. Pt's last BM was this morning; she reports that it was very hard to pass. She denies hx of kidney stones, fever, chills, diaphoresis, SOB, leg swelling, vomiting, dysuria, or frequency. LMP one week ago.   Past Medical History  Diagnosis Date  . Acid indigestion   . Hemorrhoids    Past Surgical History  Procedure Laterality Date  . No past surgeries     History reviewed. No pertinent family history. Social History  Substance Use Topics  . Smoking status: Never Smoker   . Smokeless tobacco: None  . Alcohol Use: No   OB History    Gravida Para Term Preterm AB TAB SAB Ectopic Multiple Living   0 0 0 0 0 0 0 0 0 0      Review of Systems  Cardiovascular: Positive for chest pain.  Gastrointestinal: Positive for nausea, abdominal pain and diarrhea. Negative for vomiting.  Genitourinary: Negative for dysuria and frequency.  All other systems reviewed and are negative.   Allergies  Review of patient's allergies indicates no known allergies.  Home Medications    Prior to Admission medications   Medication Sig Start Date End Date Taking? Authorizing Provider  benzonatate (TESSALON) 100 MG capsule Take 1 capsule (100 mg total) by mouth 3 (three) times daily as needed for cough. Patient not taking: Reported on 01/20/2016 10/20/15   Clayton Bibles, PA-C  fluconazole (DIFLUCAN) 150 MG tablet Take 1 tablet (150 mg total) by mouth once. Patient not taking: Reported on 10/20/2015 04/19/15   Mercedes Camprubi-Soms, PA-C  guaiFENesin (ROBITUSSIN) 100 MG/5ML liquid Take 5-10 mLs (100-200 mg total) by mouth every 4 (four) hours as needed for cough. 10/20/15   Clayton Bibles, PA-C  ibuprofen (ADVIL,MOTRIN) 800 MG tablet Take 1 tablet (800 mg total) by mouth every 8 (eight) hours as needed for mild pain or moderate pain. Patient not taking: Reported on 01/20/2016 10/20/15   Clayton Bibles, PA-C  promethazine (PHENERGAN) 25 MG tablet Take 1 tablet (25 mg total) by mouth every 6 (six) hours as needed. Patient not taking: Reported on 10/20/2015 10/11/14   Nat Christen, MD   BP 120/84 mmHg  Pulse 73  Temp(Src) 98.3 F (36.8 C) (Oral)  Resp 16  Ht 4\' 11"  (1.499 m)  Wt 172 lb (78.019 kg)  BMI 34.72 kg/m2  SpO2 98%  LMP 12/30/2015 Physical Exam  Constitutional: She is oriented to person, place, and time. She appears well-developed and well-nourished.  HENT:  Head: Normocephalic and atraumatic.  Eyes: EOM are normal. Pupils are equal, round, and reactive to  light.  Neck: Normal range of motion. Neck supple.  Cardiovascular: Normal rate, regular rhythm and normal heart sounds.   Pulmonary/Chest: Effort normal and breath sounds normal. She has no wheezes.  Abdominal: Soft. Bowel sounds are normal. She exhibits no mass. There is no tenderness. There is no rebound and no guarding.  Musculoskeletal: Normal range of motion. She exhibits no edema or tenderness.  No LE edema or tenderness  Neurological: She is alert and oriented to person, place, and time.  Skin: Skin is warm and dry.   Psychiatric: She has a normal mood and affect. Her behavior is normal.  Nursing note and vitals reviewed.   ED Course  Procedures (including critical care time) DIAGNOSTIC STUDIES: Oxygen Saturation is 99% on RA, normal by my interpretation.    COORDINATION OF CARE: 9:54 AM-Discussed treatment plan which includes labs with pt at bedside and pt agreed to plan.    Labs Review Labs Reviewed  COMPREHENSIVE METABOLIC PANEL - Abnormal; Notable for the following:    Glucose, Bld 101 (*)    Calcium 8.3 (*)    All other components within normal limits  CBC - Abnormal; Notable for the following:    RDW 16.4 (*)    All other components within normal limits  LIPASE, BLOOD - Abnormal; Notable for the following:    Lipase 71 (*)    All other components within normal limits  URINALYSIS, ROUTINE W REFLEX MICROSCOPIC (NOT AT [facility]) - Abnormal; Notable for the following:    Color, Urine STRAW (*)    Ketones, ur TRACE (*)    All other components within normal limits  PREGNANCY, URINE    Imaging Review No results found. I have personally reviewed and evaluated these lab results as part of my medical decision-making.   EKG Interpretation None      MDM   Final diagnoses:  Abdominal pain, unspecified abdominal location    35 yo F w/ nonspecific abdominal pain. Low suspicion for any emergent causes for her pain. Repeat abdominal exam is unchanged. Workup in the emergency department is negative. Tolerating by mouth without difficulty. Plans for discharge follow with primary doctor as needed if symptoms do not improve. On his return here for any new or worsening symptoms. New Prescriptions: Discharge Medication List as of 01/20/2016 12:18 PM       I have personally and contemperaneously reviewed labs and imaging and used in my decision making as above.   A medical screening exam was performed and I feel the patient has had an appropriate workup for their chief complaint at this time  and likelihood of emergent condition existing is low and thus workup can continue on an outpatient basis.. Their vital signs are stable. They have been counseled on decision, discharge, follow up and which symptoms necessitate immediate return to the emergency department.  They verbally stated understanding and agreement with plan and discharged in stable condition.    I personally performed the services described in this documentation, which was scribed in my presence. The recorded information has been reviewed and is accurate.     Merrily Pew, MD 01/20/16 1254

## 2016-01-20 NOTE — ED Notes (Signed)
Pt informed of need for second urine specimen.

## 2016-01-20 NOTE — ED Notes (Signed)
EDP at bedside  

## 2016-01-20 NOTE — ED Notes (Signed)
Patient with no complaints at this time. Respirations even and unlabored. Skin warm/dry. Discharge instructions reviewed with patient at this time. Patient given opportunity to voice concerns/ask questions. Patient discharged at this time and left Emergency Department with steady gait.   

## 2016-02-03 ENCOUNTER — Encounter (HOSPITAL_COMMUNITY): Payer: Self-pay | Admitting: Emergency Medicine

## 2016-02-03 ENCOUNTER — Emergency Department (HOSPITAL_COMMUNITY)
Admission: EM | Admit: 2016-02-03 | Discharge: 2016-02-03 | Disposition: A | Discharge status: Home / Self Care | Payer: Self-pay | Attending: Emergency Medicine | Admitting: Emergency Medicine

## 2016-02-03 DIAGNOSIS — M5431 Sciatica, right side: Secondary | ICD-10-CM | POA: Insufficient documentation

## 2016-02-03 LAB — POC URINE PREG, ED: PREG TEST UR: NEGATIVE

## 2016-02-03 MED ORDER — METHOCARBAMOL 500 MG PO TABS: 500.0000 mg | ORAL_TABLET | Freq: Four times a day (QID) | ORAL | Status: DC

## 2016-02-03 MED ORDER — DICLOFENAC SODIUM 50 MG PO TBEC: 50.0000 mg | DELAYED_RELEASE_TABLET | Freq: Two times a day (BID) | ORAL | Status: DC

## 2016-02-03 NOTE — ED Notes (Signed)
Pt reports right hip pain/buttock pain with no injury worse when standing or bending over.

## 2016-02-03 NOTE — ED Provider Notes (Signed)
CSN: [id number]     Arrival date & time 02/03/16  [id number] History   First MD Initiated Contact with Patient 02/03/16 1006     Chief Complaint  Patient presents with  . Hip Pain     (Consider location/radiation/quality/duration/timing/severity/associated sxs/prior Treatment) Patient is a 35 y.o. female presenting with hip pain. The history is provided by the patient. No language interpreter was used.  Hip Pain This is a new problem. The current episode started today. The problem occurs constantly. The problem has been gradually worsening. Associated symptoms include arthralgias and numbness. Pertinent negatives include no joint swelling. Nothing aggravates the symptoms. She has tried nothing for the symptoms. The treatment provided moderate relief.  Pt reports she has pain in right buttock down right leg.  Pain worse with movement.    Past Medical History  Diagnosis Date  . Acid indigestion   . Hemorrhoids    Past Surgical History  Procedure Laterality Date  . No past surgeries     History reviewed. No pertinent family history. Social History  Substance Use Topics  . Smoking status: Never Smoker   . Smokeless tobacco: None  . Alcohol Use: No   OB History    Gravida Para Term Preterm AB TAB SAB Ectopic Multiple Living   0 0 0 0 0 0 0 0 0 0      Review of Systems  Musculoskeletal: Positive for arthralgias. Negative for joint swelling.  Neurological: Positive for numbness.  All other systems reviewed and are negative.     Allergies  Review of patient's allergies indicates no known allergies.  Home Medications   Prior to Admission medications   Medication Sig Start Date End Date Taking? Authorizing Provider  benzonatate (TESSALON) 100 MG capsule Take 1 capsule (100 mg total) by mouth 3 (three) times daily as needed for cough. Patient not taking: Reported on 01/20/2016 10/20/15   Clayton Bibles, PA-C  fluconazole (DIFLUCAN) 150 MG tablet Take 1 tablet (150 mg total) by mouth  once. Patient not taking: Reported on 10/20/2015 04/19/15   Mercedes Camprubi-Soms, PA-C  guaiFENesin (ROBITUSSIN) 100 MG/5ML liquid Take 5-10 mLs (100-200 mg total) by mouth every 4 (four) hours as needed for cough. 10/20/15   Clayton Bibles, PA-C  ibuprofen (ADVIL,MOTRIN) 800 MG tablet Take 1 tablet (800 mg total) by mouth every 8 (eight) hours as needed for mild pain or moderate pain. Patient not taking: Reported on 01/20/2016 10/20/15   Clayton Bibles, PA-C  promethazine (PHENERGAN) 25 MG tablet Take 1 tablet (25 mg total) by mouth every 6 (six) hours as needed. Patient not taking: Reported on 10/20/2015 10/11/14   Nat Christen, MD   BP 127/77 mmHg  Pulse 92  Temp(Src) 98.8 F (37.1 C) (Oral)  Resp 18  Ht 4\' 11"  (1.499 m)  Wt 81.511 kg  BMI 36.28 kg/m2  SpO2 100%  LMP 12/23/2015 Physical Exam  Constitutional: She is oriented to person, place, and time. She appears well-developed and well-nourished.  HENT:  Head: Normocephalic and atraumatic.  Eyes: Conjunctivae and EOM are normal. Pupils are equal, round, and reactive to light.  Neck: Normal range of motion.  Cardiovascular: Normal rate.   Pulmonary/Chest: Effort normal.  Abdominal: Soft. She exhibits no distension.  Musculoskeletal: She exhibits tenderness.  Tender right sciatic notch.  Pain with movement,  nv and ns intact  Neurological: She is alert and oriented to person, place, and time.  Skin: Skin is warm.  Psychiatric: She has a normal mood and affect.  Nursing note  and vitals reviewed.   ED Course  Procedures (including critical care time) Labs Review Labs Reviewed - No data to display  Imaging Review No results found. I have personally reviewed and evaluated these images and lab results as part of my medical decision-making.   EKG Interpretation None      MDM   Final diagnoses:  Sciatica, right    Meds ordered this encounter  Medications  . diclofenac (VOLTAREN) 50 MG EC tablet    Sig: Take 1 tablet (50 mg  total) by mouth 2 (two) times daily.    Dispense:  20 tablet    Refill:  0    Order Specific Question:  Supervising Provider    Answer:  MILLER, BRIAN [[id number]]  . methocarbamol (ROBAXIN) 500 MG tablet    Sig: Take 1 tablet (500 mg total) by mouth 4 (four) times daily.    Dispense:  28 tablet    Refill:  0    Order Specific Question:  Supervising Provider    Answer:  Noemi Chapel [[id number]]  An After Visit Summary was printed and given to the patient.    Hollace Kinnier [place], PA-C 02/03/16 [place], MD 02/05/16 [phone]

## 2016-02-03 NOTE — ED Notes (Signed)
Patient verbalizes understanding of discharge instructions, prescriptions, and home care. Patient out of department at this time.

## 2016-02-03 NOTE — Discharge Instructions (Signed)

## 2016-09-26 ENCOUNTER — Encounter (HOSPITAL_COMMUNITY): Payer: Self-pay | Admitting: *Deleted

## 2016-09-26 ENCOUNTER — Emergency Department (HOSPITAL_COMMUNITY)
Admission: EM | Admit: 2016-09-26 | Discharge: 2016-09-26 | Disposition: A | Discharge status: Home / Self Care | Payer: Medicaid Other | Attending: Emergency Medicine | Admitting: Emergency Medicine

## 2016-09-26 DIAGNOSIS — Z791 Long term (current) use of non-steroidal anti-inflammatories (NSAID): Secondary | ICD-10-CM | POA: Diagnosis not present

## 2016-09-26 DIAGNOSIS — R42 Dizziness and giddiness: Secondary | ICD-10-CM | POA: Insufficient documentation

## 2016-09-26 LAB — I-STAT CHEM 8, ED
BUN: 5 mg/dL — ABNORMAL LOW (ref 6–20)
CALCIUM ION: 1.18 mmol/L (ref 1.15–1.40)
CREATININE: 0.6 mg/dL (ref 0.44–1.00)
Chloride: 101 mmol/L (ref 101–111)
GLUCOSE: 83 mg/dL (ref 65–99)
HCT: 38 % (ref 36.0–46.0)
HEMOGLOBIN: 12.9 g/dL (ref 12.0–15.0)
Potassium: 3.7 mmol/L (ref 3.5–5.1)
Sodium: 139 mmol/L (ref 135–145)
TCO2: 25 mmol/L (ref 0–100)

## 2016-09-26 NOTE — ED Triage Notes (Signed)
Pt states she just found out she was pregnant on the 28th. Today she woke up "seeing stars", she denies any dizziness at this time but states she has been dizzy some today. Denies any pain.

## 2016-09-26 NOTE — ED Provider Notes (Signed)
[facility] DEPT Provider Note   CSN: [id number] Arrival date & time: 09/26/16  [id number]     History   Chief Complaint Chief Complaint  Patient presents with  . Dizziness    HPI CASEE Tara Stark is a 36 y.o. female.  The patient states that she had some dizziness earlier today. But she is not dizzy now. She is in her first trimester pregnancy   The history is provided by the patient.  Dizziness  Quality:  Lightheadedness Severity:  Mild Onset quality:  Sudden Timing:  Rare Progression:  Resolved Chronicity:  New Associated symptoms: no chest pain, no diarrhea and no headaches     Past Medical History:  Diagnosis Date  . Acid indigestion   . Hemorrhoids     There are no active problems to display for this patient.   Past Surgical History:  Procedure Laterality Date  . NO PAST SURGERIES      OB History    Gravida Para Term Preterm AB Living   0 0 0 0 0 0   SAB TAB Ectopic Multiple Live Births   0 0 0 0         Home Medications    Prior to Admission medications   Medication Sig Start Date End Date Taking? Authorizing Provider  benzonatate (TESSALON) 100 MG capsule Take 1 capsule (100 mg total) by mouth 3 (three) times daily as needed for cough. Patient not taking: Reported on 01/20/2016 10/20/15   Clayton Bibles, PA-C  diclofenac (VOLTAREN) 50 MG EC tablet Take 1 tablet (50 mg total) by mouth 2 (two) times daily. 02/03/16   Fransico Meadow, PA-C  fluconazole (DIFLUCAN) 150 MG tablet Take 1 tablet (150 mg total) by mouth once. Patient not taking: Reported on 10/20/2015 04/19/15   Mercedes Street, PA-C  guaiFENesin (ROBITUSSIN) 100 MG/5ML liquid Take 5-10 mLs (100-200 mg total) by mouth every 4 (four) hours as needed for cough. 10/20/15   Clayton Bibles, PA-C  ibuprofen (ADVIL,MOTRIN) 800 MG tablet Take 1 tablet (800 mg total) by mouth every 8 (eight) hours as needed for mild pain or moderate pain. Patient not taking: Reported on 01/20/2016 10/20/15   Clayton Bibles, PA-C    methocarbamol (ROBAXIN) 500 MG tablet Take 1 tablet (500 mg total) by mouth 4 (four) times daily. 02/03/16   Fransico Meadow, PA-C  promethazine (PHENERGAN) 25 MG tablet Take 1 tablet (25 mg total) by mouth every 6 (six) hours as needed. Patient not taking: Reported on 10/20/2015 10/11/14   Nat Christen, MD    Family History No family history on file.  Social History Social History  Substance Use Topics  . Smoking status: Never Smoker  . Smokeless tobacco: Never Used  . Alcohol use No     Allergies   Patient has no known allergies.   Review of Systems Review of Systems  Constitutional: Negative for appetite change and fatigue.  HENT: Negative for congestion, ear discharge and sinus pressure.   Eyes: Negative for discharge.  Respiratory: Negative for cough.   Cardiovascular: Negative for chest pain.  Gastrointestinal: Negative for abdominal pain and diarrhea.  Genitourinary: Negative for frequency and hematuria.  Musculoskeletal: Negative for back pain.  Skin: Negative for rash.  Neurological: Positive for dizziness. Negative for seizures and headaches.  Psychiatric/Behavioral: Negative for hallucinations.     Physical Exam Updated Vital Signs BP 127/75 (BP Location: Left Arm)   Pulse 95   Temp 99 F (37.2 C) (Oral)   Resp 18  Ht 4\' 11"  (1.499 m)   Wt 180 lb (81.6 kg)   LMP 08/23/2016   SpO2 100%   BMI 36.36 kg/m   Physical Exam  Constitutional: She is oriented to person, place, and time. She appears well-developed.  HENT:  Head: Normocephalic.  Eyes: Conjunctivae and EOM are normal. No scleral icterus.  Neck: Neck supple. No thyromegaly present.  Cardiovascular: Normal rate and regular rhythm.  Exam reveals no gallop and no friction rub.   No murmur heard. Pulmonary/Chest: No stridor. She has no wheezes. She has no rales. She exhibits no tenderness.  Abdominal: She exhibits no distension. There is no tenderness. There is no rebound.  Musculoskeletal: Normal  range of motion. She exhibits no edema.  Lymphadenopathy:    She has no cervical adenopathy.  Neurological: She is oriented to person, place, and time. She exhibits normal muscle tone. Coordination normal.  Skin: No rash noted. No erythema.  Psychiatric: She has a normal mood and affect. Her behavior is normal.     ED Treatments / Results  Labs (all labs ordered are listed, but only abnormal results are displayed) Labs Reviewed  I-STAT CHEM 8, ED - Abnormal; Notable for the following:       Result Value   BUN 5 (*)    All other components within normal limits    EKG  EKG Interpretation None       Radiology No results found.  Procedures Procedures (including critical care time)  Medications Ordered in ED Medications - No data to display   Initial Impression / Assessment and Plan / ED Course  I have reviewed the triage vital signs and the nursing notes.  Pertinent labs & imaging results that were available during my care of the patient were reviewed by me and considered in my medical decision making (see chart for details).     Labs unremarkable. EKG normal. Patient does not feel dizzy anymore. She is not orthostatic. Suspect dizziness is just related to the intrauterine pregnancy. She is going to follow-up as needed Final Clinical Impressions(s) / ED Diagnoses   Final diagnoses:  Dizziness    New Prescriptions New Prescriptions   No medications on file     Milton Ferguson, MD 09/26/16 [phone]

## 2016-09-26 NOTE — Discharge Instructions (Signed)
Drink plenty of fluids. Make sure you eat 3 times a day. Follow-up if any problems.

## 2016-10-24 ENCOUNTER — Emergency Department (HOSPITAL_COMMUNITY)
Admission: EM | Admit: 2016-10-24 | Discharge: 2016-10-24 | Disposition: A | Discharge status: Home / Self Care | Payer: Medicaid Other | Attending: Emergency Medicine | Admitting: Emergency Medicine

## 2016-10-24 ENCOUNTER — Encounter (HOSPITAL_COMMUNITY): Payer: Self-pay | Admitting: *Deleted

## 2016-10-24 DIAGNOSIS — Z3A01 Less than 8 weeks gestation of pregnancy: Secondary | ICD-10-CM | POA: Diagnosis not present

## 2016-10-24 DIAGNOSIS — B9689 Other specified bacterial agents as the cause of diseases classified elsewhere: Secondary | ICD-10-CM

## 2016-10-24 DIAGNOSIS — O26891 Other specified pregnancy related conditions, first trimester: Secondary | ICD-10-CM | POA: Diagnosis present

## 2016-10-24 DIAGNOSIS — O23591 Infection of other part of genital tract in pregnancy, first trimester: Secondary | ICD-10-CM | POA: Insufficient documentation

## 2016-10-24 DIAGNOSIS — B373 Candidiasis of vulva and vagina: Secondary | ICD-10-CM

## 2016-10-24 DIAGNOSIS — B3731 Acute candidiasis of vulva and vagina: Secondary | ICD-10-CM

## 2016-10-24 DIAGNOSIS — N760 Acute vaginitis: Secondary | ICD-10-CM

## 2016-10-24 LAB — WET PREP, GENITAL: TRICH WET PREP: NONE SEEN

## 2016-10-24 MED ORDER — FLUCONAZOLE 150 MG PO TABS: 150.0000 mg | ORAL_TABLET | Freq: Every day | ORAL | 0 refills | Status: DC

## 2016-10-24 NOTE — Discharge Instructions (Signed)
Your examination suggest vaginal East infection, and bacterial vaginosis. Please use Diflucan as prescribed. See your GYN specialist for additional evaluation and recheck. Your vital signs are within normal limits.

## 2016-10-24 NOTE — ED Provider Notes (Signed)
[facility] DEPT Provider Note   CSN: [id number] Arrival date & time: 10/24/16  0727     History   Chief Complaint Chief Complaint  Patient presents with  . Vaginal Itching    HPI Tara Stark is a 36 y.o. female.  Patient states she is [redacted] weeks pregnant. During the beginning of the month she had some abnormality on her Pap smear and was treated with Flagyl. She finished the Flagyl approximately a week ago. She has had problems with vaginal itching and irritation recently. She was seen by her OB physician 4 days ago, and they did not do any testing, but advised her to use Monistat. The patient states that she has been using the Monistat over the last couple of days and she is not seeing any improvement, she actually feels as though it may be worse. His been no fever or chills related to this. No unusual vaginal bleeding. No abdominal pain reported.   The history is provided by the patient.  Vaginal Itching  This is a new problem. The current episode started more than 2 days ago. The problem occurs hourly. The problem has been gradually worsening. Pertinent negatives include no chest pain, no abdominal pain and no shortness of breath. Nothing aggravates the symptoms. Nothing relieves the symptoms.    Past Medical History:  Diagnosis Date  . Acid indigestion   . Hemorrhoids     There are no active problems to display for this patient.   Past Surgical History:  Procedure Laterality Date  . NO PAST SURGERIES      OB History    Gravida Para Term Preterm AB Living   0 0 0 0 0 0   SAB TAB Ectopic Multiple Live Births   0 0 0 0         Home Medications    Prior to Admission medications   Medication Sig Start Date End Date Taking? Authorizing Provider  benzonatate (TESSALON) 100 MG capsule Take 1 capsule (100 mg total) by mouth 3 (three) times daily as needed for cough. Patient not taking: Reported on 01/20/2016 10/20/15   Clayton Bibles, PA-C  diclofenac (VOLTAREN) 50  MG EC tablet Take 1 tablet (50 mg total) by mouth 2 (two) times daily. 02/03/16   Fransico Meadow, PA-C  fluconazole (DIFLUCAN) 150 MG tablet Take 1 tablet (150 mg total) by mouth once. Patient not taking: Reported on 10/20/2015 04/19/15   Mercedes Street, PA-C  guaiFENesin (ROBITUSSIN) 100 MG/5ML liquid Take 5-10 mLs (100-200 mg total) by mouth every 4 (four) hours as needed for cough. 10/20/15   Clayton Bibles, PA-C  ibuprofen (ADVIL,MOTRIN) 800 MG tablet Take 1 tablet (800 mg total) by mouth every 8 (eight) hours as needed for mild pain or moderate pain. Patient not taking: Reported on 01/20/2016 10/20/15   Clayton Bibles, PA-C  methocarbamol (ROBAXIN) 500 MG tablet Take 1 tablet (500 mg total) by mouth 4 (four) times daily. 02/03/16   Fransico Meadow, PA-C  promethazine (PHENERGAN) 25 MG tablet Take 1 tablet (25 mg total) by mouth every 6 (six) hours as needed. Patient not taking: Reported on 10/20/2015 10/11/14   Nat Christen, MD    Family History No family history on file.  Social History Social History  Substance Use Topics  . Smoking status: Never Smoker  . Smokeless tobacco: Never Used  . Alcohol use No     Allergies   Patient has no known allergies.   Review of Systems Review of Systems  Constitutional: Negative for activity change, chills and fever.       All ROS Neg except as noted in HPI  HENT: Negative for nosebleeds.   Eyes: Negative for photophobia and discharge.  Respiratory: Negative for cough, shortness of breath and wheezing.   Cardiovascular: Negative for chest pain and palpitations.  Gastrointestinal: Negative for abdominal pain and blood in stool.  Genitourinary: Positive for vaginal discharge. Negative for dysuria, frequency, hematuria, pelvic pain and vaginal bleeding.  Musculoskeletal: Negative for arthralgias, back pain and neck pain.  Skin: Negative.   Neurological: Negative for dizziness, seizures and speech difficulty.  Psychiatric/Behavioral: Negative for confusion  and hallucinations.     Physical Exam Updated Vital Signs BP 128/81   Pulse 86   Temp 98.5 F (36.9 C) (Oral)   Resp 16   Ht 4\' 11"  (1.499 m)   Wt 73.5 kg   SpO2 100%   BMI 32.72 kg/m   Physical Exam  Constitutional: She is oriented to person, place, and time. She appears well-developed and well-nourished.  Non-toxic appearance.  HENT:  Head: Normocephalic.  Right Ear: Tympanic membrane and external ear normal.  Left Ear: Tympanic membrane and external ear normal.  Eyes: EOM and lids are normal. Pupils are equal, round, and reactive to light.  Neck: Normal range of motion. Neck supple. Carotid bruit is not present.  Cardiovascular: Normal rate, regular rhythm, normal heart sounds, intact distal pulses and normal pulses.   Pulmonary/Chest: Breath sounds normal. No respiratory distress.  Abdominal: Soft. Bowel sounds are normal. There is no tenderness. There is no guarding.  Genitourinary:  Genitourinary Comments: Chaperone present during the examination.  Musculoskeletal: Normal range of motion.  Lymphadenopathy:       Head (right side): No submandibular adenopathy present.       Head (left side): No submandibular adenopathy present.    She has no cervical adenopathy.  Neurological: She is alert and oriented to person, place, and time. She has normal strength. No cranial nerve deficit or sensory deficit.  Skin: Skin is warm and dry.  Psychiatric: She has a normal mood and affect. Her speech is normal.  Nursing note and vitals reviewed.    ED Treatments / Results  Labs (all labs ordered are listed, but only abnormal results are displayed) Labs Reviewed - No data to display  EKG  EKG Interpretation None       Radiology No results found.  Procedures Procedures (including critical care time)  Medications Ordered in ED Medications - No data to display   Initial Impression / Assessment and Plan / ED Course  I have reviewed the triage vital signs and the  nursing notes.  Pertinent labs & imaging results that were available during my care of the patient were reviewed by me and considered in my medical decision making (see chart for details).     **I have reviewed nursing notes, vital signs, and all appropriate lab and imaging results for this patient.*  Final Clinical Impressions(s) / ED Diagnoses MDM Patient in no distress whatsoever. Vital signs reviewed. No vaginal bleeding reported. Patient has vaginal itching noted. Wet prep reveals yeast present, clue cells present, and many white blood cells present. There is also spurring noted. The patient will be treated with Diflucan. I've asked the patient to see her OB physician for additional evaluation. Will not redo the prescription for Flagyl at this time as the patient has just recently finished a prescription with Flagyl. The patient will see her physician with the  physicians at the [facility] hospital, or the physicians at this facility if not improving.    Final diagnoses:  Vaginal yeast infection  Bacterial vaginitis  Less than [redacted] weeks gestation of pregnancy    New Prescriptions New Prescriptions   No medications on file     Lily Kocher, PA-C 10/27/16 1753    Nat Christen, MD 10/29/16 [phone]

## 2016-10-24 NOTE — ED Triage Notes (Signed)
Pt c/o vaginal itching and burning x 3 days. Pt reports Tara Stark discharge, no foul odor. Pt has been using Monistat with no relief. Pt is [redacted] weeks pregnant and saw her OB, Dr. Evie Lacks, on Tuesday for regular OB check up and was told to use Monistat. No difficulties with pregnancy at this point. Denies vaginal bleeding, abdominal pain.

## 2016-10-26 LAB — GC/CHLAMYDIA PROBE AMP (~~LOC~~) NOT AT ARMC
CHLAMYDIA, DNA PROBE: NEGATIVE
NEISSERIA GONORRHEA: NEGATIVE

## 2016-12-10 ENCOUNTER — Ambulatory Visit (HOSPITAL_COMMUNITY)
Admission: RE | Admit: 2016-12-10 | Discharge: 2016-12-10 | Disposition: A | Discharge status: Home / Self Care | Payer: Medicaid Other | Source: Ambulatory Visit | Attending: Unknown Physician Specialty | Admitting: Unknown Physician Specialty

## 2016-12-10 ENCOUNTER — Other Ambulatory Visit (HOSPITAL_COMMUNITY): Payer: Self-pay | Admitting: Unknown Physician Specialty

## 2016-12-10 ENCOUNTER — Encounter (HOSPITAL_COMMUNITY): Payer: Self-pay

## 2016-12-10 DIAGNOSIS — O26872 Cervical shortening, second trimester: Secondary | ICD-10-CM | POA: Diagnosis present

## 2016-12-10 DIAGNOSIS — Z3A20 20 weeks gestation of pregnancy: Secondary | ICD-10-CM | POA: Insufficient documentation

## 2016-12-11 ENCOUNTER — Other Ambulatory Visit (HOSPITAL_COMMUNITY): Payer: Self-pay | Admitting: Unknown Physician Specialty

## 2016-12-11 ENCOUNTER — Other Ambulatory Visit (HOSPITAL_COMMUNITY): Payer: Self-pay | Admitting: *Deleted

## 2016-12-11 DIAGNOSIS — Z3686 Encounter for antenatal screening for cervical length: Secondary | ICD-10-CM

## 2016-12-14 ENCOUNTER — Other Ambulatory Visit (HOSPITAL_COMMUNITY): Payer: Self-pay | Admitting: *Deleted

## 2016-12-14 DIAGNOSIS — O3432 Maternal care for cervical incompetence, second trimester: Secondary | ICD-10-CM

## 2016-12-16 ENCOUNTER — Other Ambulatory Visit (HOSPITAL_COMMUNITY): Payer: Self-pay | Admitting: Obstetrics and Gynecology

## 2016-12-16 ENCOUNTER — Ambulatory Visit (HOSPITAL_COMMUNITY)
Admission: RE | Admit: 2016-12-16 | Discharge: 2016-12-16 | Disposition: A | Discharge status: Home / Self Care | Payer: Medicaid Other | Source: Ambulatory Visit | Attending: Unknown Physician Specialty | Admitting: Unknown Physician Specialty

## 2016-12-16 DIAGNOSIS — Z3A21 21 weeks gestation of pregnancy: Secondary | ICD-10-CM | POA: Insufficient documentation

## 2016-12-16 DIAGNOSIS — O09512 Supervision of elderly primigravida, second trimester: Secondary | ICD-10-CM | POA: Insufficient documentation

## 2016-12-16 DIAGNOSIS — O3432 Maternal care for cervical incompetence, second trimester: Secondary | ICD-10-CM

## 2016-12-17 ENCOUNTER — Other Ambulatory Visit (HOSPITAL_COMMUNITY): Payer: Self-pay | Admitting: Obstetrics and Gynecology

## 2016-12-17 DIAGNOSIS — O26872 Cervical shortening, second trimester: Secondary | ICD-10-CM

## 2016-12-17 DIAGNOSIS — Z3A25 25 weeks gestation of pregnancy: Secondary | ICD-10-CM

## 2016-12-18 ENCOUNTER — Encounter (HOSPITAL_COMMUNITY): Payer: Self-pay

## 2016-12-24 ENCOUNTER — Encounter (HOSPITAL_COMMUNITY): Payer: Self-pay

## 2016-12-24 ENCOUNTER — Ambulatory Visit (HOSPITAL_COMMUNITY)
Admission: RE | Admit: 2016-12-24 | Discharge: 2016-12-24 | Disposition: A | Discharge status: Home / Self Care | Payer: Medicaid Other | Source: Ambulatory Visit | Attending: Unknown Physician Specialty | Admitting: Unknown Physician Specialty

## 2016-12-24 DIAGNOSIS — O26872 Cervical shortening, second trimester: Secondary | ICD-10-CM | POA: Diagnosis present

## 2016-12-24 DIAGNOSIS — Z3A22 22 weeks gestation of pregnancy: Secondary | ICD-10-CM | POA: Insufficient documentation

## 2016-12-24 DIAGNOSIS — O09512 Supervision of elderly primigravida, second trimester: Secondary | ICD-10-CM | POA: Insufficient documentation

## 2016-12-24 DIAGNOSIS — Z3A25 25 weeks gestation of pregnancy: Secondary | ICD-10-CM

## 2016-12-25 ENCOUNTER — Other Ambulatory Visit (HOSPITAL_COMMUNITY): Payer: Self-pay | Admitting: *Deleted

## 2016-12-25 DIAGNOSIS — O3432 Maternal care for cervical incompetence, second trimester: Secondary | ICD-10-CM

## 2017-01-04 ENCOUNTER — Encounter (HOSPITAL_COMMUNITY): Payer: Self-pay

## 2017-01-04 ENCOUNTER — Ambulatory Visit (HOSPITAL_COMMUNITY)
Admission: RE | Admit: 2017-01-04 | Discharge: 2017-01-04 | Disposition: A | Discharge status: Home / Self Care | Payer: Medicaid Other | Source: Ambulatory Visit | Attending: Unknown Physician Specialty | Admitting: Unknown Physician Specialty

## 2017-01-04 DIAGNOSIS — Z3A24 24 weeks gestation of pregnancy: Secondary | ICD-10-CM | POA: Insufficient documentation

## 2017-01-04 DIAGNOSIS — O3432 Maternal care for cervical incompetence, second trimester: Secondary | ICD-10-CM | POA: Insufficient documentation

## 2017-01-04 MED ORDER — BETAMETHASONE SOD PHOS & ACET 6 (3-3) MG/ML IJ SUSP
12.0000 mg | Freq: Once | INTRAMUSCULAR | Status: AC
  Administered 2017-01-04: 12 mg via INTRAMUSCULAR
  Filled 2017-01-04: qty 2

## 2017-01-05 ENCOUNTER — Ambulatory Visit (HOSPITAL_COMMUNITY)
Admission: RE | Admit: 2017-01-05 | Discharge: 2017-01-05 | Disposition: A | Discharge status: Home / Self Care | Payer: Medicaid Other | Source: Ambulatory Visit | Attending: Unknown Physician Specialty | Admitting: Unknown Physician Specialty

## 2017-01-05 DIAGNOSIS — O3432 Maternal care for cervical incompetence, second trimester: Secondary | ICD-10-CM | POA: Insufficient documentation

## 2017-01-05 MED ORDER — BETAMETHASONE SOD PHOS & ACET 6 (3-3) MG/ML IJ SUSP
12.0000 mg | Freq: Once | INTRAMUSCULAR | Status: AC
  Administered 2017-01-05: 12 mg via INTRAMUSCULAR
  Filled 2017-01-05: qty 2

## 2017-03-16 ENCOUNTER — Emergency Department (HOSPITAL_COMMUNITY)
Admission: EM | Admit: 2017-03-16 | Discharge: 2017-03-16 | Disposition: A | Discharge status: Home / Self Care | Payer: Medicaid Other | Attending: Emergency Medicine | Admitting: Emergency Medicine

## 2017-03-16 ENCOUNTER — Encounter (HOSPITAL_COMMUNITY): Payer: Self-pay | Admitting: Cardiology

## 2017-03-16 DIAGNOSIS — Z3A35 35 weeks gestation of pregnancy: Secondary | ICD-10-CM | POA: Insufficient documentation

## 2017-03-16 DIAGNOSIS — G5603 Carpal tunnel syndrome, bilateral upper limbs: Secondary | ICD-10-CM | POA: Insufficient documentation

## 2017-03-16 DIAGNOSIS — O26893 Other specified pregnancy related conditions, third trimester: Secondary | ICD-10-CM | POA: Insufficient documentation

## 2017-03-16 DIAGNOSIS — Z87891 Personal history of nicotine dependence: Secondary | ICD-10-CM | POA: Diagnosis not present

## 2017-03-16 DIAGNOSIS — Z79899 Other long term (current) drug therapy: Secondary | ICD-10-CM | POA: Insufficient documentation

## 2017-03-16 NOTE — Discharge Instructions (Signed)
Wear your wrist splints at night and during daytime as needed.  If symptoms do not improve, you may need to see orthopedic specialist.

## 2017-03-16 NOTE — ED Provider Notes (Signed)
[place] DEPT Provider Note   CSN: [id number] Arrival date & time: 03/16/17  1831     History   Chief Complaint Chief Complaint  Patient presents with  . Hand Problem    HPI Tara Stark is a 36 y.o. female.  The history is provided by the patient.  Illness  This is a recurrent problem. The current episode started more than 2 days ago. The problem occurs daily. The problem has not changed since onset.Pertinent negatives include no chest pain, no abdominal pain, no headaches and no shortness of breath. Nothing aggravates the symptoms. Nothing relieves the symptoms. She has tried nothing for the symptoms.   36 year old female who presents with bilateral hand pain and numbness. She is [redacted] weeks gestational age. States that she has had carpal tunnel before intermittently of the left hand, but this feels much worse over the past week and involves bilateral hands. Symptoms are worse at night. Feels burning, tingling, and pain over the palmar surface of both hands. No weakness. States she has otherwise been in her usual state of health. No injury or fall.   Past Medical History:  Diagnosis Date  . Acid indigestion   . Hemorrhoids     There are no active problems to display for this patient.   Past Surgical History:  Procedure Laterality Date  . NO PAST SURGERIES      OB History    Gravida Para Term Preterm AB Living   1 0 0 0 0 0   SAB TAB Ectopic Multiple Live Births   0 0 0 0         Home Medications    Prior to Admission medications   Medication Sig Start Date End Date Taking? Authorizing Provider  benzonatate (TESSALON) 100 MG capsule Take 1 capsule (100 mg total) by mouth 3 (three) times daily as needed for cough. Patient not taking: Reported on 01/20/2016 10/20/15   Clayton Bibles, PA-C  diclofenac (VOLTAREN) 50 MG EC tablet Take 1 tablet (50 mg total) by mouth 2 (two) times daily. Patient not taking: Reported on 12/10/2016 02/03/16   Fransico Meadow, PA-C    fluconazole (DIFLUCAN) 150 MG tablet Take 1 tablet (150 mg total) by mouth daily. Patient not taking: Reported on 12/10/2016 10/24/16   Lily Kocher, PA-C  guaiFENesin (ROBITUSSIN) 100 MG/5ML liquid Take 5-10 mLs (100-200 mg total) by mouth every 4 (four) hours as needed for cough. Patient not taking: Reported on 12/10/2016 10/20/15   Clayton Bibles, PA-C  ibuprofen (ADVIL,MOTRIN) 800 MG tablet Take 1 tablet (800 mg total) by mouth every 8 (eight) hours as needed for mild pain or moderate pain. Patient not taking: Reported on 01/20/2016 10/20/15   Clayton Bibles, PA-C  methocarbamol (ROBAXIN) 500 MG tablet Take 1 tablet (500 mg total) by mouth 4 (four) times daily. Patient not taking: Reported on 12/10/2016 02/03/16   Fransico Meadow, PA-C  Prenatal Vit w/Fe-Methylfol-FA (PNV PO) Take by mouth.    [provider]  progesterone 200 MG SUPP Place 200 mg vaginally at bedtime.    [provider]  promethazine (PHENERGAN) 25 MG tablet Take 1 tablet (25 mg total) by mouth every 6 (six) hours as needed. Patient not taking: Reported on 10/20/2015 10/11/14   Nat Christen, MD    Family History History reviewed. No pertinent family history.  Social History Social History  Substance Use Topics  . Smoking status: Former Smoker    Packs/day: 0.25    Types: Cigarettes  Quit date: 09/23/2016  . Smokeless tobacco: Never Used  . Alcohol use No     Allergies   Patient has no known allergies.   Review of Systems Review of Systems  Constitutional: Negative for fever.  Respiratory: Negative for shortness of breath.   Cardiovascular: Negative for chest pain.  Gastrointestinal: Negative for abdominal pain.  Genitourinary: Negative for vaginal bleeding.  Neurological: Negative for headaches.  All other systems reviewed and are negative.    Physical Exam Updated Vital Signs BP (!) 154/89 (BP Location: Right Arm)   Pulse 85   Temp 98.4 F (36.9 C)   Resp 16   Ht 4\' 11"  (1.499 m)   Wt  81.6 kg (180 lb)   LMP 08/17/2016   SpO2 99%   BMI 36.36 kg/m   Physical Exam Physical Exam  Nursing note and vitals reviewed. Constitutional: Well developed, well nourished, non-toxic, and in no acute distress Head: Normocephalic and atraumatic.  Mouth/Throat: Oropharynx is clear and moist.  Neck: Normal range of motion. Neck supple.  Cardiovascular: Normal rate and regular rhythm.   +2 radial pulses bilaterally Pulmonary/Chest: Effort normal and breath sounds normal.  Abdominal: Soft. There is no tenderness. There is no rebound and no guarding.  Musculoskeletal: Normal range of motion.  no deformities. Positive Tinel's sign. Reports tingling in the distribution of bilateral median nerves of the hand.  Neurological: Alert, no facial droop, fluent speech, moves all extremities symmetrically, full motor strength in bilateral upper extremities Skin: Skin is warm and dry.  Psychiatric: Cooperative   ED Treatments / Results  Labs (all labs ordered are listed, but only abnormal results are displayed) Labs Reviewed - No data to display  EKG  EKG Interpretation None       Radiology No results found.  Procedures Procedures (including critical care time)  Medications Ordered in ED Medications - No data to display   Initial Impression / Assessment and Plan / ED Course  I have reviewed the triage vital signs and the nursing notes.  Pertinent labs & imaging results that were available during my care of the patient were reviewed by me and considered in my medical decision making (see chart for details).     36 year old female at [redacted] weeks gestational age who presents with tingling and pain in bilateral hands. Presentation is consistent with median nerve neuropathy/carpal tunnel. Discussed supportive care management for this. Is given a wrist cockup splints for both wrists to wear at night. Strict return and follow-up instructions reviewed. She expressed understanding of all  discharge instructions and felt comfortable with the plan of care.   Final Clinical Impressions(s) / ED Diagnoses   Final diagnoses:  Bilateral carpal tunnel syndrome    New Prescriptions New Prescriptions   No medications on file     Forde Dandy, MD 03/16/17 1901

## 2017-03-16 NOTE — ED Triage Notes (Signed)
Bilateral hand swelling and burning times one week.  [redacted] weeks pregnant.

## 2017-03-22 ENCOUNTER — Emergency Department (HOSPITAL_COMMUNITY)
Admission: EM | Admit: 2017-03-22 | Discharge: 2017-03-22 | Disposition: A | Discharge status: Other Acute Inpatient Hospital | Payer: Medicaid Other | Attending: Emergency Medicine | Admitting: Emergency Medicine

## 2017-03-22 ENCOUNTER — Encounter (HOSPITAL_COMMUNITY): Payer: Self-pay

## 2017-03-22 DIAGNOSIS — W010XXA Fall on same level from slipping, tripping and stumbling without subsequent striking against object, initial encounter: Secondary | ICD-10-CM | POA: Insufficient documentation

## 2017-03-22 DIAGNOSIS — Y999 Unspecified external cause status: Secondary | ICD-10-CM | POA: Insufficient documentation

## 2017-03-22 DIAGNOSIS — Y929 Unspecified place or not applicable: Secondary | ICD-10-CM | POA: Diagnosis not present

## 2017-03-22 DIAGNOSIS — Z87891 Personal history of nicotine dependence: Secondary | ICD-10-CM | POA: Insufficient documentation

## 2017-03-22 DIAGNOSIS — Z3A35 35 weeks gestation of pregnancy: Secondary | ICD-10-CM | POA: Insufficient documentation

## 2017-03-22 DIAGNOSIS — O1493 Unspecified pre-eclampsia, third trimester: Secondary | ICD-10-CM | POA: Diagnosis not present

## 2017-03-22 DIAGNOSIS — Y939 Activity, unspecified: Secondary | ICD-10-CM | POA: Insufficient documentation

## 2017-03-22 DIAGNOSIS — S301XXA Contusion of abdominal wall, initial encounter: Secondary | ICD-10-CM | POA: Diagnosis not present

## 2017-03-22 DIAGNOSIS — Z79899 Other long term (current) drug therapy: Secondary | ICD-10-CM | POA: Diagnosis not present

## 2017-03-22 DIAGNOSIS — S3092XA Unspecified superficial injury of abdominal wall, initial encounter: Secondary | ICD-10-CM | POA: Diagnosis present

## 2017-03-22 LAB — URINALYSIS, ROUTINE W REFLEX MICROSCOPIC
BILIRUBIN URINE: NEGATIVE
Glucose, UA: NEGATIVE mg/dL
Hgb urine dipstick: NEGATIVE
Ketones, ur: NEGATIVE mg/dL
LEUKOCYTES UA: NEGATIVE
NITRITE: NEGATIVE
Protein, ur: NEGATIVE mg/dL
SPECIFIC GRAVITY, URINE: 1.015 (ref 1.005–1.030)
pH: 6 (ref 5.0–8.0)

## 2017-03-22 LAB — COMPREHENSIVE METABOLIC PANEL
ALT: 25 U/L (ref 14–54)
ANION GAP: 8 (ref 5–15)
AST: 27 U/L (ref 15–41)
Albumin: 3.4 g/dL — ABNORMAL LOW (ref 3.5–5.0)
Alkaline Phosphatase: 114 U/L (ref 38–126)
BUN: 6 mg/dL (ref 6–20)
CHLORIDE: 106 mmol/L (ref 101–111)
CO2: 23 mmol/L (ref 22–32)
Calcium: 9.1 mg/dL (ref 8.9–10.3)
Creatinine, Ser: 0.49 mg/dL (ref 0.44–1.00)
GFR calc non Af Amer: >60 mL/min (ref >60)
Glucose, Bld: 101 mg/dL — ABNORMAL HIGH (ref 65–99)
Potassium: 3.1 mmol/L — ABNORMAL LOW (ref 3.5–5.1)
SODIUM: 137 mmol/L (ref 135–145)
Total Bilirubin: 0.5 mg/dL (ref 0.3–1.2)
Total Protein: 7 g/dL (ref 6.5–8.1)

## 2017-03-22 LAB — CBC WITH DIFFERENTIAL/PLATELET
BASOS PCT: 0 %
Basophils Absolute: 0 10*3/uL (ref 0.0–0.1)
Eosinophils Absolute: 0 10*3/uL (ref 0.0–0.7)
Eosinophils Relative: 0 %
HEMATOCRIT: 39.2 % (ref 36.0–46.0)
HEMOGLOBIN: 13.6 g/dL (ref 12.0–15.0)
LYMPHS PCT: 23 %
Lymphs Abs: 2.5 10*3/uL (ref 0.7–4.0)
MCH: 28.8 pg (ref 26.0–34.0)
MCHC: 34.7 g/dL (ref 30.0–36.0)
MCV: 82.9 fL (ref 78.0–100.0)
MONO ABS: 0.8 10*3/uL (ref 0.1–1.0)
Monocytes Relative: 8 %
NEUTROS PCT: 69 %
Neutro Abs: 7.6 10*3/uL (ref 1.7–7.7)
Platelets: 205 10*3/uL (ref 150–400)
RBC: 4.73 MIL/uL (ref 3.87–5.11)
RDW: 13.6 % (ref 11.5–15.5)
WBC: 10.9 10*3/uL — AB (ref 4.0–10.5)

## 2017-03-22 MED ORDER — LABETALOL HCL 5 MG/ML IV SOLN
10.0000 mg | Freq: Once | INTRAVENOUS | Status: AC
  Administered 2017-03-22: 10 mg via INTRAVENOUS
  Filled 2017-03-22: qty 4

## 2017-03-22 MED ORDER — MAGNESIUM SULFATE 2 GM/50ML IV SOLN
2.0000 g | INTRAVENOUS | Status: AC
  Administered 2017-03-22: 2 g via INTRAVENOUS
  Filled 2017-03-22: qty 50

## 2017-03-22 NOTE — Progress Notes (Signed)
Patient presented with fall. No bleeding no abdominal pain no leaking fluids. BP noted 202/108, retaken at 176/99, and then again 155/107. Feet swollen. Spoke with Dr Roselie Awkward regarding patient and he suggested either patient be transferred to [facility] with her primary care physician or here. Patient opted for transfer to [facility]. Haskel Khan

## 2017-03-22 NOTE — ED Triage Notes (Addendum)
Patient reports of tripping and falling this evening and falling onto abdomen. Patient is currently [redacted] weeks pregnant.  Denies abdominal/back pain. Denies any vaginal leaking or blood. Also reports of bilateral feet swelling x2 days. BP 220/109 in triage.

## 2017-03-22 NOTE — ED Provider Notes (Signed)
[place] DEPT Provider Note   CSN: [id number] Arrival date & time: 03/22/17  2215     History   Chief Complaint Chief Complaint  Patient presents with  . Fall    HPI Tara Stark is a 36 y.o. female.  HPI  The patient is a 36 year old female who is G1 P0 at approximately 35 weeks, pregnancy has been called located by the need for a cerclage was placed proximally 17 weeks. The patient has otherwise been feeling well, progressing nicely through the pregnancy and has not had any difficulty with blood pressure diabetes or any other medical issues. She reports that this evening she had a slip and fall, she fell forward onto her abdomen. She had some pain but has minimal pain at this time. No head injury, no other injuries to report. No bleeding or vaginal fluid. The patient states that over the last couple of days she has had increased swelling in her ankles and feet. This occurred just prior to arrival, it was acute in onset, the symptoms are persistent and gradually improving with regards to abdominal discomfort.  Past Medical History:  Diagnosis Date  . Acid indigestion   . Hemorrhoids     There are no active problems to display for this patient.   Past Surgical History:  Procedure Laterality Date  . NO PAST SURGERIES      OB History    Gravida Para Term Preterm AB Living   1 0 0 0 0 0   SAB TAB Ectopic Multiple Live Births   0 0 0 0         Home Medications    Prior to Admission medications   Medication Sig Start Date End Date Taking? Authorizing Provider  benzonatate (TESSALON) 100 MG capsule Take 1 capsule (100 mg total) by mouth 3 (three) times daily as needed for cough. Patient not taking: Reported on 01/20/2016 10/20/15   Clayton Bibles, PA-C  diclofenac (VOLTAREN) 50 MG EC tablet Take 1 tablet (50 mg total) by mouth 2 (two) times daily. Patient not taking: Reported on 12/10/2016 02/03/16   Fransico Meadow, PA-C  fluconazole (DIFLUCAN) 150 MG tablet Take 1  tablet (150 mg total) by mouth daily. Patient not taking: Reported on 12/10/2016 10/24/16   Lily Kocher, PA-C  guaiFENesin (ROBITUSSIN) 100 MG/5ML liquid Take 5-10 mLs (100-200 mg total) by mouth every 4 (four) hours as needed for cough. Patient not taking: Reported on 12/10/2016 10/20/15   Clayton Bibles, PA-C  ibuprofen (ADVIL,MOTRIN) 800 MG tablet Take 1 tablet (800 mg total) by mouth every 8 (eight) hours as needed for mild pain or moderate pain. Patient not taking: Reported on 01/20/2016 10/20/15   Clayton Bibles, PA-C  methocarbamol (ROBAXIN) 500 MG tablet Take 1 tablet (500 mg total) by mouth 4 (four) times daily. Patient not taking: Reported on 12/10/2016 02/03/16   Fransico Meadow, PA-C  Prenatal Vit w/Fe-Methylfol-FA (PNV PO) Take by mouth.    [provider]  progesterone 200 MG SUPP Place 200 mg vaginally at bedtime.    [provider]  promethazine (PHENERGAN) 25 MG tablet Take 1 tablet (25 mg total) by mouth every 6 (six) hours as needed. Patient not taking: Reported on 10/20/2015 10/11/14   Nat Christen, MD    Family History No family history on file.  Social History Social History  Substance Use Topics  . Smoking status: Former Smoker    Packs/day: 0.25    Types: Cigarettes    Quit date: 09/23/2016  .  Smokeless tobacco: Never Used  . Alcohol use No     Allergies   Patient has no known allergies.   Review of Systems Review of Systems  All other systems reviewed and are negative.    Physical Exam Updated Vital Signs BP (S) (!) 220/109 (BP Location: Left Arm) Comment: verified 2 times  Pulse (!) 120   Temp 98.6 F (37 C) (Oral)   Ht 4\' 11"  (1.499 m)   Wt 81.6 kg (180 lb)   LMP 08/17/2016   SpO2 100%   BMI 36.36 kg/m   Physical Exam  Constitutional: She appears well-developed and well-nourished. No distress.  HENT:  Head: Normocephalic and atraumatic.  Mouth/Throat: Oropharynx is clear and moist. No oropharyngeal exudate.  Eyes: Pupils are  equal, round, and reactive to light. Conjunctivae and EOM are normal. Right eye exhibits no discharge. Left eye exhibits no discharge. No scleral icterus.  Neck: Normal range of motion. Neck supple. No JVD present. No thyromegaly present.  Cardiovascular: Normal rate, regular rhythm, normal heart sounds and intact distal pulses.  Exam reveals no gallop and no friction rub.   No murmur heard. . She is not tachycardic but she is significantly hypertensive  Pulmonary/Chest: Effort normal and breath sounds normal. No respiratory distress. She has no wheezes. She has no rales.  Abdominal: Soft. Bowel sounds are normal. She exhibits no distension and no mass. There is no tenderness.  The abdomen is soft and nontender, her gestational size is appropriate for gestational age based on abdominal exam and uterine height  Musculoskeletal: Normal range of motion. She exhibits edema ( Bilateral symmetrical edema of the ankles and feet.). She exhibits no tenderness.  Lymphadenopathy:    She has no cervical adenopathy.  Neurological: She is alert. Coordination normal.  The patient is able to speak without difficulty, ambulates without any gait instability, normal strength in all 4 extremities, cranial nerves III through XII appear to be intact including no facial asymmetry. Gross visual acuity appears normal, pupillary exam appears normal.  Skin: Skin is warm and dry. No rash noted. No erythema.  Psychiatric: She has a normal mood and affect. Her behavior is normal.  Nursing note and vitals reviewed.    ED Treatments / Results  Labs (all labs ordered are listed, but only abnormal results are displayed) Labs Reviewed  URINALYSIS, ROUTINE W REFLEX MICROSCOPIC  CBC WITH DIFFERENTIAL/PLATELET  COMPREHENSIVE METABOLIC PANEL    Radiology No results found.  Procedures Procedures (including critical care time)  Medications Ordered in ED Medications  magnesium sulfate IVPB 2 g 50 mL (not administered)    labetalol (NORMODYNE,TRANDATE) injection 10 mg (not administered)     Initial Impression / Assessment and Plan / ED Course  I have reviewed the triage vital signs and the nursing notes.  Pertinent labs & imaging results that were available during my care of the patient were reviewed by me and considered in my medical decision making (see chart for details).     Due to the patient's gestational age and significant hypertension with an abdominal injury she will need to be observed at the [facility]. I have discussed this with Dr. Barrie Dunker at [facility] rocking him, he has accepted the patient in transport. Emtala documentation has been completed. The patient will be started on magnesium sulfate and a single dose of labetalol for her hypertension. She has no neurologic symptoms. Labs are pending at the time of transfer.  Final Clinical Impressions(s) / ED Diagnoses   Final diagnoses:  Pre-eclampsia in third trimester  Contusion of abdominal wall, initial encounter    New Prescriptions New Prescriptions   No medications on file     Noemi Chapel, MD 03/22/17 2321

## 2017-03-23 NOTE — ED Notes (Signed)
Pt wheeled to ambulance via stretcher. Pt verbalized understanding of transfer.

## 2017-04-03 DIAGNOSIS — Z9889 Other specified postprocedural states: Secondary | ICD-10-CM

## 2017-04-03 DIAGNOSIS — O1490 Unspecified pre-eclampsia, unspecified trimester: Secondary | ICD-10-CM

## 2017-04-07 DIAGNOSIS — I10 Essential (primary) hypertension: Secondary | ICD-10-CM | POA: Diagnosis not present

## 2017-04-07 DIAGNOSIS — R002 Palpitations: Secondary | ICD-10-CM | POA: Diagnosis not present

## 2017-04-07 DIAGNOSIS — R000 Tachycardia, unspecified: Secondary | ICD-10-CM | POA: Diagnosis not present

## 2017-04-07 DIAGNOSIS — Z87891 Personal history of nicotine dependence: Secondary | ICD-10-CM | POA: Diagnosis not present

## 2017-04-07 DIAGNOSIS — O1495 Unspecified pre-eclampsia, complicating the puerperium: Secondary | ICD-10-CM | POA: Diagnosis not present

## 2017-04-16 ENCOUNTER — Encounter (HOSPITAL_COMMUNITY): Payer: Self-pay

## 2017-04-16 ENCOUNTER — Emergency Department (HOSPITAL_COMMUNITY)
Admission: EM | Admit: 2017-04-16 | Discharge: 2017-04-17 | Disposition: A | Discharge status: Home / Self Care | Payer: Medicaid Other | Attending: Emergency Medicine | Admitting: Emergency Medicine

## 2017-04-16 DIAGNOSIS — Z87891 Personal history of nicotine dependence: Secondary | ICD-10-CM | POA: Insufficient documentation

## 2017-04-16 DIAGNOSIS — R000 Tachycardia, unspecified: Secondary | ICD-10-CM | POA: Diagnosis not present

## 2017-04-16 DIAGNOSIS — Z79899 Other long term (current) drug therapy: Secondary | ICD-10-CM | POA: Diagnosis not present

## 2017-04-16 LAB — CBC WITH DIFFERENTIAL/PLATELET
Basophils Absolute: 0 10*3/uL (ref 0.0–0.1)
Basophils Relative: 0 %
Eosinophils Absolute: 0.2 10*3/uL (ref 0.0–0.7)
Eosinophils Relative: 1 %
HCT: 40.2 % (ref 36.0–46.0)
Hemoglobin: 13.5 g/dL (ref 12.0–15.0)
Lymphocytes Relative: 31 %
Lymphs Abs: 3.7 10*3/uL (ref 0.7–4.0)
MCH: 28.7 pg (ref 26.0–34.0)
MCHC: 33.6 g/dL (ref 30.0–36.0)
MCV: 85.4 fL (ref 78.0–100.0)
Monocytes Absolute: 0.7 10*3/uL (ref 0.1–1.0)
Monocytes Relative: 6 %
Neutro Abs: 7.4 10*3/uL (ref 1.7–7.7)
Neutrophils Relative %: 62 %
Platelets: 315 10*3/uL (ref 150–400)
RBC: 4.71 MIL/uL (ref 3.87–5.11)
RDW: 14.1 % (ref 11.5–15.5)
WBC: 12 10*3/uL — ABNORMAL HIGH (ref 4.0–10.5)

## 2017-04-16 LAB — COMPREHENSIVE METABOLIC PANEL
ALBUMIN: 4 g/dL (ref 3.5–5.0)
ALT: 24 U/L (ref 14–54)
AST: 19 U/L (ref 15–41)
Alkaline Phosphatase: 62 U/L (ref 38–126)
Anion gap: 11 (ref 5–15)
BUN: 16 mg/dL (ref 6–20)
CO2: 25 mmol/L (ref 22–32)
Calcium: 9 mg/dL (ref 8.9–10.3)
Chloride: 103 mmol/L (ref 101–111)
Creatinine, Ser: 0.55 mg/dL (ref 0.44–1.00)
GFR calc Af Amer: >60 mL/min (ref >60)
GFR calc non Af Amer: >60 mL/min (ref >60)
GLUCOSE: 111 mg/dL — AB (ref 65–99)
POTASSIUM: 3.3 mmol/L — AB (ref 3.5–5.1)
Sodium: 139 mmol/L (ref 135–145)
Total Bilirubin: 0.3 mg/dL (ref 0.3–1.2)
Total Protein: 8.1 g/dL (ref 6.5–8.1)

## 2017-04-16 LAB — I-STAT TROPONIN, ED: Troponin i, poc: 0.02 ng/mL (ref 0.00–0.08)

## 2017-04-16 LAB — D-DIMER, QUANTITATIVE: D-Dimer, Quant: 0.78 ug/mL-FEU — ABNORMAL HIGH (ref 0.00–0.50)

## 2017-04-16 NOTE — ED Triage Notes (Signed)
Pt reports feeling like her heart is racing, denies pain, states they have been trying to regulate her blood pressure since having a baby 2 weeks ago.;

## 2017-04-16 NOTE — ED Provider Notes (Signed)
[place] DEPT Provider Note   CSN: [id number] Arrival date & time: 04/16/17  2231     History   Chief Complaint Chief Complaint  Patient presents with  . Tachycardia    HPI Tara Stark is a 36 y.o. female.  The history is provided by the patient.  She is 2 weeks postpartum pregnancy complicated by preeclampsia, currently on nifedipine for blood pressure control. She is breast-feeding. About 10 PM, she developed since her heart was racing. There was no chest pain, heaviness, tightness, pressure. There was no dyspnea, nausea, vomiting, diaphoresis. She continues to feel like her heart is racing, but not as severely as it was initially. She has not had problems like this before.  Past Medical History:  Diagnosis Date  . Acid indigestion   . Hemorrhoids     There are no active problems to display for this patient.   Past Surgical History:  Procedure Laterality Date  . NO PAST SURGERIES      OB History    Gravida Para Term Preterm AB Living   1 0 0 0 0 0   SAB TAB Ectopic Multiple Live Births   0 0 0 0         Home Medications    Prior to Admission medications   Medication Sig Start Date End Date Taking? Authorizing Provider  benzonatate (TESSALON) 100 MG capsule Take 1 capsule (100 mg total) by mouth 3 (three) times daily as needed for cough. Patient not taking: Reported on 01/20/2016 10/20/15   Clayton Bibles, PA-C  diclofenac (VOLTAREN) 50 MG EC tablet Take 1 tablet (50 mg total) by mouth 2 (two) times daily. Patient not taking: Reported on 12/10/2016 02/03/16   Fransico Meadow, PA-C  fluconazole (DIFLUCAN) 150 MG tablet Take 1 tablet (150 mg total) by mouth daily. Patient not taking: Reported on 12/10/2016 10/24/16   Lily Kocher, PA-C  guaiFENesin (ROBITUSSIN) 100 MG/5ML liquid Take 5-10 mLs (100-200 mg total) by mouth every 4 (four) hours as needed for cough. Patient not taking: Reported on 12/10/2016 10/20/15   Clayton Bibles, PA-C  ibuprofen (ADVIL,MOTRIN)  800 MG tablet Take 1 tablet (800 mg total) by mouth every 8 (eight) hours as needed for mild pain or moderate pain. Patient not taking: Reported on 01/20/2016 10/20/15   Clayton Bibles, PA-C  methocarbamol (ROBAXIN) 500 MG tablet Take 1 tablet (500 mg total) by mouth 4 (four) times daily. Patient not taking: Reported on 12/10/2016 02/03/16   Fransico Meadow, PA-C  Prenatal Vit w/Fe-Methylfol-FA (PNV PO) Take by mouth.    [provider]  progesterone 200 MG SUPP Place 200 mg vaginally at bedtime.    [provider]  promethazine (PHENERGAN) 25 MG tablet Take 1 tablet (25 mg total) by mouth every 6 (six) hours as needed. Patient not taking: Reported on 10/20/2015 10/11/14   Nat Christen, MD    Family History No family history on file.  Social History Social History  Substance Use Topics  . Smoking status: Former Smoker    Packs/day: 0.25    Types: Cigarettes    Quit date: 09/23/2016  . Smokeless tobacco: Never Used  . Alcohol use No     Allergies   Patient has no known allergies.   Review of Systems Review of Systems  All other systems reviewed and are negative.    Physical Exam Updated Vital Signs BP (!) 168/94 (BP Location: Right Arm)   Pulse (!) 143   Temp 98.2 F (36.8 C) (  Oral)   Resp 10   Ht 4\' 11"  (1.499 m)   Wt 73.5 kg (162 lb)   LMP 08/17/2016   SpO2 100%   BMI 32.72 kg/m   Physical Exam  Nursing note and vitals reviewed.  36 year old female, resting comfortably and in no acute distress. Vital signs are significant for hypertension and tachycardia. Oxygen saturation is 100%, which is normal. Head is normocephalic and atraumatic. PERRLA, EOMI. Oropharynx is clear. Neck is nontender and supple without adenopathy or JVD. Back is nontender and there is no CVA tenderness. Lungs are clear without rales, wheezes, or rhonchi. Chest is nontender. Heart is tachycardic and regular without murmur. Abdomen is soft, flat, nontender without masses or  hepatosplenomegaly and peristalsis is normoactive. Extremities have 1+ edema, full range of motion is present. Skin is warm and dry without rash. Neurologic: Mental status is normal, cranial nerves are intact, there are no motor or sensory deficits.  ED Treatments / Results  Labs (all labs ordered are listed, but only abnormal results are displayed) Labs Reviewed  COMPREHENSIVE METABOLIC PANEL - Abnormal; Notable for the following:       Result Value   Potassium 3.3 (*)    Glucose, Bld 111 (*)    All other components within normal limits  CBC WITH DIFFERENTIAL/PLATELET - Abnormal; Notable for the following:    WBC 12.0 (*)    All other components within normal limits  URINALYSIS, ROUTINE W REFLEX MICROSCOPIC - Abnormal; Notable for the following:    Color, Urine STRAW (*)    Hgb urine dipstick MODERATE (*)    Squamous Epithelial / LPF 0-5 (*)    All other components within normal limits  D-DIMER, QUANTITATIVE (NOT AT [facility]) - Abnormal; Notable for the following:    D-Dimer, Quant 0.78 (*)    All other components within normal limits  I-STAT TROPONIN, ED    EKG  EKG Interpretation  Date/Time:  Friday April 16 2017 22:43:04 EDT Ventricular Rate:  144 PR Interval:    QRS Duration: 89 QT Interval:  275 QTC Calculation: 426 R Axis:   54 Text Interpretation:  Sinus tachycardia Probable left atrial enlargement Probable LVH with secondary repol abnrm When compared with ECG of 09/26/2016, HEART RATE has increased Nonspecific ST abnormality is slightly more pronounced - probably rate-related Confirmed by Delora Fuel ([id number]) on 04/17/2017 12:20:23 AM      Radiology Ct Angio Chest Pe W And/or Wo Contrast  Result Date: 04/17/2017 CLINICAL DATA:  Tachycardia elevated D-dimer, recent delivery EXAM: CT ANGIOGRAPHY CHEST WITH CONTRAST TECHNIQUE: Multidetector CT imaging of the chest was performed using the standard protocol during bolus administration of intravenous contrast. Multiplanar  CT image reconstructions and MIPs were obtained to evaluate the vascular anatomy. CONTRAST:  100 mL Isovue 370 intravenous COMPARISON:  Radiograph 04/07/2017 FINDINGS: Cardiovascular: Satisfactory opacification of the pulmonary arteries to the segmental level. No evidence of pulmonary embolism. Borderline cardiomegaly. No pericardial effusion. Nonaneurysmal aorta. No dissection is seen. Mediastinum/Nodes: No enlarged mediastinal, hilar, or axillary lymph nodes. Thyroid gland, trachea, and esophagus demonstrate no significant findings. Lungs/Pleura: Lungs are clear. No pleural effusion or pneumothorax. Upper Abdomen: No acute abnormality. Musculoskeletal: No chest wall abnormality. No acute or significant osseous findings. Review of the MIP images confirms the above findings. IMPRESSION: Negative for acute pulmonary embolus or aortic dissection. Clear lung fields. Electronically Signed   By: Donavan Foil M.D.   On: 04/17/2017 01:27    Procedures Procedures (including critical care time)  Medications  Ordered in ED Medications  potassium chloride SA (K-DUR,KLOR-CON) CR tablet 40 mEq (40 mEq Oral Given 04/17/17 0121)  iopamidol (ISOVUE-370) 76 % injection 100 mL (100 mLs Intravenous Contrast Given 04/17/17 0100)  sodium chloride 0.9 % bolus 1,000 mL (0 mLs Intravenous Stopped 04/17/17 0308)  metoprolol tartrate (LOPRESSOR) injection 2.5 mg (2.5 mg Intravenous Given 04/17/17 0333)     Initial Impression / Assessment and Plan / ED Course  I have reviewed the triage vital signs and the nursing notes.  Pertinent labs & imaging results that were available during my care of the patient were reviewed by me and considered in my medical decision making (see chart for details).  Tachycardia of unknown cause. Old records are reviewed showing ED visit last month with preeclampsia. We'll check ECG, screening labs. Although no chest pain and dyspnea, patient is at risk for PE, so we'll screen with d-dimer.  D-dimer  is come back slightly elevated. Potassium is low and she is given a dose of oral potassium. CT angiogram is obtained which shows no evidence of pulmonary embolism. Platelet count and hepatic functions are normal which regular again significant preeclampsia. She continued to have tachycardia and was given IV fluids with no significant change in heart rate. There did not seem to be any serious pathology, so it was felt best to manage the elevated heart rate pharmacologically. Given the fact that she is breast-feeding, it is felt that metoprolol is still safe and low-dose per she is given a small dose of metoprolol following which a heart rate has come down and blood pressure has come down. She is discharged on low-dose metoprolol and with instructions to follow-up with PCP.  Final Clinical Impressions(s) / ED Diagnoses   Final diagnoses:  Sinus tachycardia    New Prescriptions New Prescriptions   METOPROLOL TARTRATE (LOPRESSOR) 25 MG TABLET    Take 0.5 tablets (12.5 mg total) by mouth 2 (two) times daily.     Delora Fuel, MD 24/26/83 [phone]

## 2017-04-17 ENCOUNTER — Emergency Department (HOSPITAL_COMMUNITY): Payer: Medicaid Other

## 2017-04-17 LAB — URINALYSIS, ROUTINE W REFLEX MICROSCOPIC
BACTERIA UA: NONE SEEN
Bilirubin Urine: NEGATIVE
GLUCOSE, UA: NEGATIVE mg/dL
KETONES UR: NEGATIVE mg/dL
Leukocytes, UA: NEGATIVE
Nitrite: NEGATIVE
PROTEIN: NEGATIVE mg/dL
Specific Gravity, Urine: 1.014 (ref 1.005–1.030)
pH: 7 (ref 5.0–8.0)

## 2017-04-17 MED ORDER — POTASSIUM CHLORIDE CRYS ER 20 MEQ PO TBCR
40.0000 meq | EXTENDED_RELEASE_TABLET | Freq: Once | ORAL | Status: AC
  Administered 2017-04-17: 40 meq via ORAL
  Filled 2017-04-17: qty 2

## 2017-04-17 MED ORDER — METOPROLOL TARTRATE 25 MG PO TABS: 12.5000 mg | ORAL_TABLET | Freq: Two times a day (BID) | ORAL | 0 refills | Status: DC

## 2017-04-17 MED ORDER — IOPAMIDOL (ISOVUE-370) INJECTION 76%
100.0000 mL | Freq: Once | INTRAVENOUS | Status: AC | PRN
  Administered 2017-04-17: 100 mL via INTRAVENOUS

## 2017-04-17 MED ORDER — METOPROLOL TARTRATE 5 MG/5ML IV SOLN
2.5000 mg | Freq: Once | INTRAVENOUS | Status: AC
  Administered 2017-04-17: 2.5 mg via INTRAVENOUS
  Filled 2017-04-17: qty 5

## 2017-04-17 MED ORDER — SODIUM CHLORIDE 0.9 % IV BOLUS (SEPSIS)
1000.0000 mL | Freq: Once | INTRAVENOUS | Status: AC
  Administered 2017-04-17: 1000 mL via INTRAVENOUS

## 2017-04-17 NOTE — ED Notes (Signed)
ED Provider at bedside. 

## 2017-04-17 NOTE — ED Notes (Signed)
Patient transported to CT 

## 2017-04-23 DIAGNOSIS — Z87891 Personal history of nicotine dependence: Secondary | ICD-10-CM | POA: Diagnosis not present

## 2017-04-23 DIAGNOSIS — I10 Essential (primary) hypertension: Secondary | ICD-10-CM | POA: Diagnosis not present

## 2017-04-23 DIAGNOSIS — G8929 Other chronic pain: Secondary | ICD-10-CM | POA: Diagnosis not present

## 2017-04-23 DIAGNOSIS — M79661 Pain in right lower leg: Secondary | ICD-10-CM | POA: Diagnosis not present

## 2017-04-24 ENCOUNTER — Encounter (HOSPITAL_COMMUNITY): Payer: Self-pay

## 2017-04-24 ENCOUNTER — Ambulatory Visit (HOSPITAL_COMMUNITY)
Admission: EM | Admit: 2017-04-24 | Discharge: 2017-04-24 | Disposition: A | Discharge status: Home / Self Care | Payer: Medicaid Other | Attending: Physician Assistant | Admitting: Physician Assistant

## 2017-04-24 DIAGNOSIS — N951 Menopausal and female climacteric states: Secondary | ICD-10-CM | POA: Diagnosis not present

## 2017-04-24 DIAGNOSIS — Z87891 Personal history of nicotine dependence: Secondary | ICD-10-CM | POA: Insufficient documentation

## 2017-04-24 DIAGNOSIS — R002 Palpitations: Secondary | ICD-10-CM

## 2017-04-24 DIAGNOSIS — R8290 Unspecified abnormal findings in urine: Secondary | ICD-10-CM

## 2017-04-24 DIAGNOSIS — R232 Flushing: Secondary | ICD-10-CM

## 2017-04-24 DIAGNOSIS — I10 Essential (primary) hypertension: Secondary | ICD-10-CM | POA: Insufficient documentation

## 2017-04-24 DIAGNOSIS — R82998 Other abnormal findings in urine: Secondary | ICD-10-CM

## 2017-04-24 LAB — POCT URINALYSIS DIP (DEVICE)
Bilirubin Urine: NEGATIVE
Glucose, UA: NEGATIVE mg/dL
Nitrite: NEGATIVE
PH: 7 (ref 5.0–8.0)
PROTEIN: 30 mg/dL — AB
Specific Gravity, Urine: 1.015 (ref 1.005–1.030)
UROBILINOGEN UA: 2 mg/dL — AB (ref 0.0–1.0)

## 2017-04-24 LAB — POCT I-STAT, CHEM 8
BUN: 4 mg/dL — AB (ref 6–20)
CREATININE: 0.4 mg/dL — AB (ref 0.44–1.00)
Calcium, Ion: 1.07 mmol/L — ABNORMAL LOW (ref 1.15–1.40)
Chloride: 101 mmol/L (ref 101–111)
Glucose, Bld: 104 mg/dL — ABNORMAL HIGH (ref 65–99)
HEMATOCRIT: 46 % (ref 36.0–46.0)
Hemoglobin: 15.6 g/dL — ABNORMAL HIGH (ref 12.0–15.0)
POTASSIUM: 3.7 mmol/L (ref 3.5–5.1)
Sodium: 140 mmol/L (ref 135–145)
TCO2: 28 mmol/L (ref 22–32)

## 2017-04-24 MED ORDER — CEPHALEXIN 500 MG PO CAPS: 500.0000 mg | ORAL_CAPSULE | Freq: Four times a day (QID) | ORAL | 0 refills | Status: DC

## 2017-04-24 NOTE — ED Triage Notes (Signed)
Patient presents to [facility] with elevated BP and hot flashes since 2:00pm today 04/24/2017, pt has taken metoprolol and Nifedipine this morning

## 2017-04-24 NOTE — Discharge Instructions (Signed)
Your labs show you have a urinary tract infection.  I am starting you on keflex and will culture your urine.  Continue you Metoprolol and Nifedipine for now.

## 2017-04-24 NOTE — ED Provider Notes (Signed)
04/24/2017 4:43 PM   DOB: [birth year] / MRN: [id number]  SUBJECTIVE:  Tara Stark is a 36 y.o. female presenting for complaint of elevated BP.  Has a neonate at home and will be seeing her GYN doctor for routine follow up on the 8th. Did have pre-eclampsia in the third trimester. This is her first and last child. Was seen in the ED about 1 week ago and started in metop for tachycardia without any other symptoms. She stopped breast feeding last weekend. Baby is healthy.   Took Nifedepine XL 60 mg total at about ten am as instructed from her Ob/GYN.  Took the metoprolol at 9 am.  Had a hot flash at around 1 pm today and this lasted may 5 minutes.  Tells me that her heart was also palpitating at that time.    She has No Known Allergies.   She  has a past medical history of Acid indigestion and Hemorrhoids.    She  reports that she quit smoking about 7 months ago. Her smoking use included Cigarettes. She smoked 0.25 packs per day. She has never used smokeless tobacco. She reports that she does not drink alcohol or use drugs. She  reports that she currently engages in sexual activity. She reports using the following method of birth control/protection: None. The patient  has a past surgical history that includes No past surgeries.  Her family history is not on file.  Review of Systems  Constitutional: Negative for chills, diaphoresis and fever.  HENT: Negative for sore throat.   Respiratory: Negative for cough, hemoptysis, sputum production, shortness of breath and wheezing.   Cardiovascular: Positive for palpitations (none at this time). Negative for chest pain, orthopnea and leg swelling.  Gastrointestinal: Negative for abdominal pain, blood in stool, constipation, diarrhea, heartburn, melena, nausea and vomiting.  Genitourinary: Negative for flank pain.  Musculoskeletal: Negative for myalgias.  Skin: Negative for rash.  Neurological: Negative for dizziness and headaches.    OBJECTIVE:  BP  137/87 (BP Location: Left Arm)   Pulse 97   Temp 98 F (36.7 C) (Oral)   Resp 17   LMP 08/17/2016   SpO2 100%   Breastfeeding? No Comment: pt had baby 21 days ago  BP Readings from Last 3 Encounters:  04/24/17 137/87  04/17/17 128/89  03/22/17 (!) 175/104   Pulse Readings from Last 3 Encounters:  04/24/17 97  04/17/17 (!) 102  03/22/17 94    Wt Readings from Last 3 Encounters:  04/16/17 162 lb (73.5 kg)  03/22/17 180 lb (81.6 kg)  03/16/17 180 lb (81.6 kg)   Lab Results  Component Value Date   WBC 12.0 (H) 04/16/2017   HGB 15.6 (H) 04/24/2017   HCT 46.0 04/24/2017   MCV 85.4 04/16/2017   PLT 315 04/16/2017   Physical Exam  Constitutional: She is active.  Non-toxic appearance.  Cardiovascular: Normal rate, regular rhythm, S1 normal, S2 normal, normal heart sounds and intact distal pulses.  Exam reveals no gallop, no friction rub and no decreased pulses.   No murmur heard. Pulmonary/Chest: Effort normal. No stridor. No tachypnea. No respiratory distress. She has no wheezes. She has no rales.  Abdominal: Soft. Bowel sounds are normal. She exhibits no distension and no mass. There is no tenderness. There is no rebound and no guarding.  Musculoskeletal: She exhibits no edema.  Neurological: She is alert.  Skin: Skin is warm and dry. She is not diaphoretic. No pallor.    Results for orders placed or  performed during the hospital encounter of 04/24/17 (from the past 72 hour(s))  POCT urinalysis dip (device)     Status: Abnormal   Collection Time: 04/24/17  4:24 PM  Result Value Ref Range   Glucose, UA NEGATIVE NEGATIVE mg/dL   Bilirubin Urine NEGATIVE NEGATIVE   Ketones, ur TRACE (A) NEGATIVE mg/dL   Specific Gravity, Urine 1.015 1.005 - 1.030   Hgb urine dipstick MODERATE (A) NEGATIVE   pH 7.0 5.0 - 8.0   Protein, ur 30 (A) NEGATIVE mg/dL   Urobilinogen, UA 2.0 (H) 0.0 - 1.0 mg/dL   Nitrite NEGATIVE NEGATIVE   Leukocytes, UA LARGE (A) NEGATIVE    Comment:  Biochemical Testing Only. Please order routine urinalysis from main lab if confirmatory testing is needed.  I-STAT, chem 8     Status: Abnormal   Collection Time: 04/24/17  4:25 PM  Result Value Ref Range   Sodium 140 135 - 145 mmol/L   Potassium 3.7 3.5 - 5.1 mmol/L   Chloride 101 101 - 111 mmol/L   BUN 4 (L) 6 - 20 mg/dL   Creatinine, Ser 0.40 (L) 0.44 - 1.00 mg/dL   Glucose, Bld 104 (H) 65 - 99 mg/dL   Calcium, Ion 1.07 (L) 1.15 - 1.40 mmol/L   TCO2 28 22 - 32 mmol/L   Hemoglobin 15.6 (H) 12.0 - 15.0 g/dL   HCT 46.0 36.0 - 46.0 %    No results found.  ASSESSMENT AND PLAN:  The primary encounter diagnosis was Hot flashes. A diagnosis of Urine leukocytes was also pertinent to this visit. UTI for keflex.  HTN well controlled and she should continue her current regimen.   Advised she keep her followup appointments.     The patient is advised to call or return to clinic if she does not see an improvement in symptoms, or to seek the care of the closest emergency department if she worsens with the above plan.   Philis Fendt, MHS, PA-C 04/24/2017 4:43 PM    Tereasa Coop, PA-C 04/24/17 1645

## 2017-04-27 LAB — URINE CULTURE

## 2017-05-03 DIAGNOSIS — I10 Essential (primary) hypertension: Secondary | ICD-10-CM | POA: Diagnosis not present

## 2017-05-03 DIAGNOSIS — E669 Obesity, unspecified: Secondary | ICD-10-CM | POA: Diagnosis not present

## 2017-07-21 DIAGNOSIS — H6121 Impacted cerumen, right ear: Secondary | ICD-10-CM | POA: Diagnosis not present

## 2017-07-21 DIAGNOSIS — H6122 Impacted cerumen, left ear: Secondary | ICD-10-CM | POA: Diagnosis not present

## 2017-07-21 DIAGNOSIS — R42 Dizziness and giddiness: Secondary | ICD-10-CM | POA: Diagnosis not present

## 2017-07-22 ENCOUNTER — Other Ambulatory Visit: Payer: Self-pay

## 2017-07-22 ENCOUNTER — Encounter (HOSPITAL_COMMUNITY): Payer: Self-pay | Admitting: Emergency Medicine

## 2017-07-22 ENCOUNTER — Emergency Department (HOSPITAL_COMMUNITY)
Admission: EM | Admit: 2017-07-22 | Discharge: 2017-07-22 | Disposition: A | Discharge status: Home / Self Care | Payer: Medicaid Other | Attending: Emergency Medicine | Admitting: Emergency Medicine

## 2017-07-22 DIAGNOSIS — Z87891 Personal history of nicotine dependence: Secondary | ICD-10-CM | POA: Insufficient documentation

## 2017-07-22 DIAGNOSIS — T50901A Poisoning by unspecified drugs, medicaments and biological substances, accidental (unintentional), initial encounter: Secondary | ICD-10-CM

## 2017-07-22 DIAGNOSIS — T461X1A Poisoning by calcium-channel blockers, accidental (unintentional), initial encounter: Secondary | ICD-10-CM | POA: Diagnosis not present

## 2017-07-22 DIAGNOSIS — Z036 Encounter for observation for suspected toxic effect from ingested substance ruled out: Secondary | ICD-10-CM | POA: Diagnosis present

## 2017-07-22 MED ORDER — METOPROLOL TARTRATE 50 MG PO TABS
50.0000 mg | ORAL_TABLET | Freq: Once | ORAL | Status: AC
  Administered 2017-07-22: 50 mg via ORAL
  Filled 2017-07-22: qty 1

## 2017-07-22 NOTE — ED Notes (Signed)
Ambulated pt hallway without difficulty and steady gait. Pt denies feeling dizzy. Pt describes herself as feeling "a little off" but states she feels this is because she just woke up from sleeping. Pt ambulated around the nursing station and down the entrance hallway.

## 2017-07-22 NOTE — Discharge Instructions (Signed)
Make sure that you are taking the appropriate medication.

## 2017-07-22 NOTE — ED Triage Notes (Signed)
Pt states the she has taken "1 too many of my procardia." Pt states she had mistaken it for another one of her medications. Pt states she feels like her heart is racing.

## 2017-07-22 NOTE — ED Provider Notes (Signed)
[facility] EMERGENCY DEPARTMENT Provider Note   CSN: [id number] Arrival date & time: 07/22/17  0119     History   Chief Complaint Chief Complaint  Patient presents with  . Ingestion    HPI Tara Stark is a 36 y.o. female.  The history is provided by the patient.  She is on medication for hypertension and thinks she took the wrong medication.  She normally takes nifedipine 30 mg every morning, and metoprolol 50 mg twice a day.  This evening, she took her nifedipine instead of metoprolol at 9:30 PM.  She also relates that she gets swimmy headed intermittently over the past several months.  She is not currently experiencing that.  She is 3-1/2 months postpartum and no longer breast-feeding.  She is not feeling dizzy or lightheaded currently.  She denies any chest pain or dyspnea.  Past Medical History:  Diagnosis Date  . Acid indigestion   . Hemorrhoids     There are no active problems to display for this patient.   Past Surgical History:  Procedure Laterality Date  . NO PAST SURGERIES      OB History    Gravida Para Term Preterm AB Living   1 0 0 0 0 0   SAB TAB Ectopic Multiple Live Births   0 0 0 0         Home Medications    Prior to Admission medications   Medication Sig Start Date End Date Taking? Authorizing Provider  cephALEXin (KEFLEX) 500 MG capsule Take 1 capsule (500 mg total) by mouth 4 (four) times daily. 04/24/17   Tereasa Coop, PA-C  metoprolol tartrate (LOPRESSOR) 25 MG tablet Take 0.5 tablets (12.5 mg total) by mouth 2 (two) times daily. 3/81/82   Delora Fuel, MD  NIFEdipine (PROCARDIA-XL/ADALAT CC) 30 MG 24 hr tablet Take 30 mg by mouth daily.    [provider]  Prenatal MV-Min-FA-Omega-3 (PRENATAL GUMMIES/DHA & FA) 0.4-32.5 MG CHEW Chew by mouth daily.    [provider]    Family History No family history on file.  Social History Social History   Tobacco Use  . Smoking status: Former Smoker    Packs/day:  0.25    Types: Cigarettes    Last attempt to quit: 09/23/2016    Years since quitting: 0.8  . Smokeless tobacco: Never Used  Substance Use Topics  . Alcohol use: No  . Drug use: No     Allergies   Patient has no known allergies.   Review of Systems Review of Systems  All other systems reviewed and are negative.    Physical Exam Updated Vital Signs BP 129/87   Pulse 85   Temp 97.8 F (36.6 C) (Oral)   Resp 19   Ht 5\' 4"  (1.626 m)   Wt 73.5 kg (162 lb)   LMP 07/20/2017 (Exact Date)   SpO2 100%   BMI 27.81 kg/m   Physical Exam  Nursing note and vitals reviewed.  36 year old female, resting comfortably and in no acute distress. Vital signs are normal. Oxygen saturation is 100%, which is normal. Head is normocephalic and atraumatic. PERRLA, EOMI. Oropharynx is clear. Neck is nontender and supple without adenopathy or JVD. Back is nontender and there is no CVA tenderness. Lungs are clear without rales, wheezes, or rhonchi. Chest is nontender. Heart has regular rate and rhythm without murmur. Abdomen is soft, flat, nontender without masses or hepatosplenomegaly and peristalsis is normoactive. Extremities have no cyanosis or edema,  full range of motion is present. Skin is warm and dry without rash. Neurologic: Mental status is normal, cranial nerves are intact, there are no motor or sensory deficits.  No symptoms elicited with passive head movement.  ED Treatments / Results   EKG  EKG Interpretation  Date/Time:  Thursday July 22 2017 01:33:40 EST Ventricular Rate:  102 PR Interval:    QRS Duration: 88 QT Interval:  354 QTC Calculation: 462 R Axis:   50 Text Interpretation:  Sinus tachycardia Right atrial enlargement When compared with ECG of 04/16/2017, HEART RATE has decreased REPOLARIZATION ABNORMALITY has resolved, was probably rate-related Confirmed by Delora Fuel ([id number]) on 07/22/2017 1:42:46 AM      Procedures Procedures (including critical care  time)  Medications Ordered in ED Medications  metoprolol tartrate (LOPRESSOR) tablet 50 mg (not administered)     Initial Impression / Assessment and Plan / ED Course  I have reviewed the triage vital signs and the nursing notes.  Accidental ingestion of nifedipine.  She is currently 5 hours following ingestion and is maintaining normal blood pressure.  She had not taken her evening metoprolol, so she is given a dose of that.  She will be observed in the ED for 1hour and discharged if she is hemodynamically stable at that point.  Old records are reviewed, and she was seen in the ED 3 months ago for tachycardia which is when metoprolol was started.   She was observed in the emergency department and was hemodynamically stable.  She was ambulated without any difficulty.  She is discharged with instructions to resume her normal medication schedule.  Final Clinical Impressions(s) / ED Diagnoses   Final diagnoses:  Accidental drug overdose, initial encounter    ED Discharge Orders    None       Delora Fuel, MD 29/51/88 [phone]

## 2017-07-25 ENCOUNTER — Encounter (HOSPITAL_COMMUNITY): Payer: Self-pay | Admitting: Emergency Medicine

## 2017-07-25 ENCOUNTER — Emergency Department (HOSPITAL_COMMUNITY)
Admission: EM | Admit: 2017-07-25 | Discharge: 2017-07-25 | Disposition: A | Discharge status: Home / Self Care | Payer: Medicaid Other | Attending: Emergency Medicine | Admitting: Emergency Medicine

## 2017-07-25 ENCOUNTER — Other Ambulatory Visit: Payer: Self-pay

## 2017-07-25 DIAGNOSIS — M542 Cervicalgia: Secondary | ICD-10-CM | POA: Diagnosis not present

## 2017-07-25 DIAGNOSIS — Z87891 Personal history of nicotine dependence: Secondary | ICD-10-CM | POA: Diagnosis not present

## 2017-07-25 DIAGNOSIS — R42 Dizziness and giddiness: Secondary | ICD-10-CM | POA: Insufficient documentation

## 2017-07-25 DIAGNOSIS — Z79899 Other long term (current) drug therapy: Secondary | ICD-10-CM | POA: Diagnosis not present

## 2017-07-25 DIAGNOSIS — R202 Paresthesia of skin: Secondary | ICD-10-CM | POA: Diagnosis not present

## 2017-07-25 LAB — BASIC METABOLIC PANEL
Anion gap: 8 (ref 5–15)
BUN: 8 mg/dL (ref 6–20)
CALCIUM: 8.9 mg/dL (ref 8.9–10.3)
CO2: 24 mmol/L (ref 22–32)
CREATININE: 0.64 mg/dL (ref 0.44–1.00)
Chloride: 104 mmol/L (ref 101–111)
Glucose, Bld: 93 mg/dL (ref 65–99)
Potassium: 3.7 mmol/L (ref 3.5–5.1)
SODIUM: 136 mmol/L (ref 135–145)

## 2017-07-25 LAB — CBC
HEMATOCRIT: 45.5 % (ref 36.0–46.0)
Hemoglobin: 15.1 g/dL — ABNORMAL HIGH (ref 12.0–15.0)
MCH: 27.8 pg (ref 26.0–34.0)
MCHC: 33.2 g/dL (ref 30.0–36.0)
MCV: 83.6 fL (ref 78.0–100.0)
PLATELETS: 234 10*3/uL (ref 150–400)
RBC: 5.44 MIL/uL — ABNORMAL HIGH (ref 3.87–5.11)
RDW: 15.6 % — AB (ref 11.5–15.5)
WBC: 9.6 10*3/uL (ref 4.0–10.5)

## 2017-07-25 MED ORDER — SODIUM CHLORIDE 0.9 % IV BOLUS (SEPSIS)
1000.0000 mL | Freq: Once | INTRAVENOUS | Status: AC
  Administered 2017-07-25: 1000 mL via INTRAVENOUS

## 2017-07-25 NOTE — Discharge Instructions (Signed)
Return to the ED with any concerns including fainting, chest pain, changes in vision or speech, weakness of arms or legs, or any other alarming symptoms  You should take 1/2 dose of lopressor and arrange for followup with your primary care doctor in the next several days.

## 2017-07-25 NOTE — ED Notes (Signed)
Talked with [place], [place],  Do not order any labs, at this time.

## 2017-07-25 NOTE — ED Provider Notes (Signed)
[facility] EMERGENCY DEPARTMENT Provider Note   CSN: [id number] Arrival date & time: 07/25/17  1412     History   Chief Complaint Chief Complaint  Patient presents with  . Neck Pain  . Shoulder Pain  . Dizziness    HPI Tara Stark is a 36 y.o. female.  HPI  Patient with recent diagnosis of preeclampsia during her last pregnancy who takes 2 blood pressure medications presenting with generalized weakness and some lightheadedness with standing.  She states that occasionally she feels dizzy.  She has not fainted.  She has felt more tired than usual.  She was started on Lopressor and nifedipine during the end of her most recent pregnancy and she delivered in September 2018.  She has had rechecks with her primary doctor who decided to keep her on the 2 blood pressure medications.  She has not had any chest pain.  She also complains of feeling a sharp pain in the right side of her neck at times and tingling in her left upper back associated with some neck pain.  She has no weakness of her arms or legs.  No significant headache.  There are no other associated systemic symptoms, there are no other alleviating or modifying factors.   Past Medical History:  Diagnosis Date  . Acid indigestion   . Hemorrhoids     There are no active problems to display for this patient.   Past Surgical History:  Procedure Laterality Date  . NO PAST SURGERIES      OB History    Gravida Para Term Preterm AB Living   1 0 0 0 0 0   SAB TAB Ectopic Multiple Live Births   0 0 0 0         Home Medications    Prior to Admission medications   Medication Sig Start Date End Date Taking? Authorizing Provider  metoprolol tartrate (LOPRESSOR) 25 MG tablet Take 0.5 tablets (12.5 mg total) by mouth 2 (two) times daily. Patient taking differently: Take 25 mg by mouth 2 (two) times daily.  4/96/75  Yes Delora Fuel, MD  NIFEdipine (PROCARDIA-XL/ADALAT CC) 30 MG 24 hr tablet Take 60 mg  by mouth daily.    Yes [provider]    Family History No family history on file.  Social History Social History   Tobacco Use  . Smoking status: Former Smoker    Packs/day: 0.25    Types: Cigarettes    Last attempt to quit: 09/23/2016    Years since quitting: 0.8  . Smokeless tobacco: Never Used  Substance Use Topics  . Alcohol use: No  . Drug use: No     Allergies   Patient has no known allergies.   Review of Systems Review of Systems  ROS reviewed and all otherwise negative except for mentioned in HPI   Physical Exam Updated Vital Signs BP 128/83 (BP Location: Right Arm)   Pulse 73   Temp 99.3 F (37.4 C)   Resp 20   Ht 4\' 11"  (1.499 m)   Wt 72.6 kg (160 lb)   LMP 07/20/2017 (Exact Date)   SpO2 100%   BMI 32.32 kg/m  Vitals reviewed Physical Exam  Physical Examination: General appearance - alert, well appearing, and in no distress Mental status - alert, oriented to person, place, and time Eyes - no conjunctival injection, no scleral icterus Mouth - mucous membranes moist, pharynx normal without lesions Neck - supple, no significant adenopathy Chest - clear  to auscultation, no wheezes, rales or rhonchi, symmetric air entry Heart - normal rate, regular rhythm, normal S1, S2, no murmurs, rubs, clicks or gallops Abdomen - soft, nontender, nondistended, no masses or organomegaly Neurological - alert, oriented, normal speech Extremities - peripheral pulses normal, no pedal edema, no clubbing or cyanosis Skin - normal coloration and turgor, no rashes   ED Treatments / Results  Labs (all labs ordered are listed, but only abnormal results are displayed) Labs Reviewed  CBC - Abnormal; Notable for the following components:      Result Value   RBC 5.44 (*)    Hemoglobin 15.1 (*)    RDW 15.6 (*)    All other components within normal limits  BASIC METABOLIC PANEL    EKG  EKG Interpretation  Date/Time:  Sunday July 25 2017 17:58:02  EST Ventricular Rate:  67 PR Interval:    QRS Duration: 71 QT Interval:  399 QTC Calculation: 422 R Axis:   51 Text Interpretation:  Sinus rhythm Since previous tracing rate slower Confirmed by Alfonzo Beers [phone]) on 07/25/2017 6:13:18 PM       Radiology No results found.  Procedures Procedures (including critical care time)  Medications Ordered in ED Medications  sodium chloride 0.9 % bolus 1,000 mL (0 mLs Intravenous Stopped 07/25/17 1857)     Initial Impression / Assessment and Plan / ED Course  I have reviewed the triage vital signs and the nursing notes.  Pertinent labs & imaging results that were available during my care of the patient were reviewed by me and considered in my medical decision making (see chart for details).     Patient presents with complaint of feeling lightheaded at times.  She was started on 2 blood pressure medications for preeclampsia.  She delivered her child several months ago and has remained on the medications.  She has not fainted.  She has a normal neurologic exam.  She has no signs of infection.  She is not anemic.  Her EKG is reassuring.  Patient advised to cut her Lopressor dose in half and to continue her other medications she was also advised to follow-up in the next several days with her primary care doctor to devise a plan if she needs to wean these blood pressure medications.  Final Clinical Impressions(s) / ED Diagnoses   Final diagnoses:  Lightheadedness    ED Discharge Orders    None       Talaysha Freeberg, Forbes Cellar, MD 07/25/17 4627

## 2017-07-25 NOTE — ED Triage Notes (Signed)
Pt. Stated, I was here 3 days ago for the same symptoms, nothing found and went home.

## 2017-07-25 NOTE — ED Triage Notes (Signed)
Pt. Stated, I started having rt. Neck and shoulder pain, no injury and sometimes I feel lightheaded.  Sometimes my heart feels like its racing. I take BP medicine since my son was born, and they think maybe I need to lower the dosage.

## 2017-09-23 ENCOUNTER — Emergency Department (HOSPITAL_COMMUNITY)
Admission: EM | Admit: 2017-09-23 | Discharge: 2017-09-23 | Disposition: A | Discharge status: Home / Self Care | Payer: Self-pay | Attending: Emergency Medicine | Admitting: Emergency Medicine

## 2017-09-23 ENCOUNTER — Encounter (HOSPITAL_COMMUNITY): Payer: Self-pay

## 2017-09-23 ENCOUNTER — Emergency Department (HOSPITAL_COMMUNITY): Payer: Self-pay

## 2017-09-23 DIAGNOSIS — Z79899 Other long term (current) drug therapy: Secondary | ICD-10-CM | POA: Insufficient documentation

## 2017-09-23 DIAGNOSIS — R42 Dizziness and giddiness: Secondary | ICD-10-CM | POA: Insufficient documentation

## 2017-09-23 DIAGNOSIS — F17210 Nicotine dependence, cigarettes, uncomplicated: Secondary | ICD-10-CM | POA: Insufficient documentation

## 2017-09-23 LAB — COMPREHENSIVE METABOLIC PANEL
ALK PHOS: 41 U/L (ref 38–126)
ALT: 19 U/L (ref 14–54)
ANION GAP: 8 (ref 5–15)
AST: 18 U/L (ref 15–41)
Albumin: 3.8 g/dL (ref 3.5–5.0)
BILIRUBIN TOTAL: 0.4 mg/dL (ref 0.3–1.2)
BUN: 9 mg/dL (ref 6–20)
CALCIUM: 8.6 mg/dL — AB (ref 8.9–10.3)
CO2: 24 mmol/L (ref 22–32)
CREATININE: 0.55 mg/dL (ref 0.44–1.00)
Chloride: 102 mmol/L (ref 101–111)
GFR calc non Af Amer: >60 mL/min (ref >60)
Glucose, Bld: 94 mg/dL (ref 65–99)
Potassium: 3.9 mmol/L (ref 3.5–5.1)
Sodium: 134 mmol/L — ABNORMAL LOW (ref 135–145)
TOTAL PROTEIN: 7 g/dL (ref 6.5–8.1)

## 2017-09-23 LAB — CBC WITH DIFFERENTIAL/PLATELET
Basophils Absolute: 0 10*3/uL (ref 0.0–0.1)
Basophils Relative: 0 %
Eosinophils Absolute: 0.1 10*3/uL (ref 0.0–0.7)
Eosinophils Relative: 1 %
HEMATOCRIT: 41.7 % (ref 36.0–46.0)
HEMOGLOBIN: 13.2 g/dL (ref 12.0–15.0)
LYMPHS ABS: 2.3 10*3/uL (ref 0.7–4.0)
LYMPHS PCT: 32 %
MCH: 27 pg (ref 26.0–34.0)
MCHC: 31.7 g/dL (ref 30.0–36.0)
MCV: 85.3 fL (ref 78.0–100.0)
MONOS PCT: 11 %
Monocytes Absolute: 0.8 10*3/uL (ref 0.1–1.0)
NEUTROS ABS: 4.1 10*3/uL (ref 1.7–7.7)
NEUTROS PCT: 56 %
Platelets: 203 10*3/uL (ref 150–400)
RBC: 4.89 MIL/uL (ref 3.87–5.11)
RDW: 15.1 % (ref 11.5–15.5)
WBC: 7.3 10*3/uL (ref 4.0–10.5)

## 2017-09-23 NOTE — Discharge Instructions (Signed)
Today's workup without any acute findings.  Keep your appointment with neurology scheduled for Monday.  Return for any new or worse symptoms.

## 2017-09-23 NOTE — ED Notes (Signed)
Patient to radiology.

## 2017-09-23 NOTE — ED Provider Notes (Signed)
Results for orders placed or performed during the hospital encounter of 09/23/17  CBC with Differential/Platelet  Result Value Ref Range   WBC 7.3 4.0 - 10.5 K/uL   RBC 4.89 3.87 - 5.11 MIL/uL   Hemoglobin 13.2 12.0 - 15.0 g/dL   HCT 41.7 36.0 - 46.0 %   MCV 85.3 78.0 - 100.0 fL   MCH 27.0 26.0 - 34.0 pg   MCHC 31.7 30.0 - 36.0 g/dL   RDW 15.1 11.5 - 15.5 %   Platelets 203 150 - 400 K/uL   Neutrophils Relative % 56 %   Neutro Abs 4.1 1.7 - 7.7 K/uL   Lymphocytes Relative 32 %   Lymphs Abs 2.3 0.7 - 4.0 K/uL   Monocytes Relative 11 %   Monocytes Absolute 0.8 0.1 - 1.0 K/uL   Eosinophils Relative 1 %   Eosinophils Absolute 0.1 0.0 - 0.7 K/uL   Basophils Relative 0 %   Basophils Absolute 0.0 0.0 - 0.1 K/uL  Comprehensive metabolic panel  Result Value Ref Range   Sodium 134 (L) 135 - 145 mmol/L   Potassium 3.9 3.5 - 5.1 mmol/L   Chloride 102 101 - 111 mmol/L   CO2 24 22 - 32 mmol/L   Glucose, Bld 94 65 - 99 mg/dL   BUN 9 6 - 20 mg/dL   Creatinine, Ser 0.55 0.44 - 1.00 mg/dL   Calcium 8.6 (L) 8.9 - 10.3 mg/dL   Total Protein 7.0 6.5 - 8.1 g/dL   Albumin 3.8 3.5 - 5.0 g/dL   AST 18 15 - 41 U/L   ALT 19 14 - 54 U/L   Alkaline Phosphatase 41 38 - 126 U/L   Total Bilirubin 0.4 0.3 - 1.2 mg/dL   GFR calc non Af Amer >60 >60 mL/min   GFR calc Af Amer >60 >60 mL/min   Anion gap 8 5 - 15   Dg Chest 2 View  Result Date: 09/23/2017 CLINICAL DATA:  Chest pain for several months. EXAM: CHEST  2 VIEW COMPARISON:  04/07/2017 chest radiograph FINDINGS: Heart size is UPPER limits of normal. There is no evidence of focal airspace disease, pulmonary edema, suspicious pulmonary nodule/mass, pleural effusion, or pneumothorax. No acute bony abnormalities are identified. IMPRESSION: UPPER limits normal heart size without evidence of active cardiopulmonary disease. Electronically Signed   By: Margarette Canada M.D.   On: 09/23/2017 15:43   Dg Cervical Spine Complete  Result Date: 09/23/2017 CLINICAL  DATA:  Neck pain for 2 months. EXAM: CERVICAL SPINE - COMPLETE 4+ VIEW COMPARISON:  None. FINDINGS: No acute fracture, subluxation or prevertebral soft tissue swelling. Very mild degenerative disc disease noted at C4-5 and C5-6. There is no evidence of bony foraminal narrowing. No focal bony lesions are present. IMPRESSION: 1. No evidence of acute abnormality 2. Very mild degenerative disc disease at C4-5 and C5-6. Electronically Signed   By: Margarette Canada M.D.   On: 09/23/2017 15:44   Ct Head Wo Contrast  Result Date: 09/23/2017 CLINICAL DATA:  Lightheadedness for the past 3 months. EXAM: CT HEAD WITHOUT CONTRAST TECHNIQUE: Contiguous axial images were obtained from the base of the skull through the vertex without intravenous contrast. COMPARISON:  None. FINDINGS: Brain: No evidence of acute infarction, hemorrhage, hydrocephalus, extra-axial collection or mass lesion/mass effect. Vascular: No hyperdense vessel or unexpected calcification. Skull: Normal. Negative for fracture or focal lesion. Sinuses/Orbits: No acute finding.  Underdeveloped mastoid air cells. Other: None. IMPRESSION: Normal noncontrast head CT. Electronically Signed   By: Gwyndolyn Saxon  Marzella Schlein M.D.   On: 09/23/2017 15:32   Patient informed of CT x-ray and lab results.  Patient has an appointment with neurology scheduled for Monday.  Patient stable for discharge home.   Fredia Sorrow, MD 09/23/17 [phone]

## 2017-09-23 NOTE — ED Triage Notes (Signed)
Pt has been feeling light headed x 3 months, has an appointment with Neurology on Monday. States normally the feeling goes away but today it isn't going away.

## 2017-09-23 NOTE — ED Notes (Signed)
Patient transported to X-ray 

## 2017-09-23 NOTE — ED Notes (Signed)
Pt c/o intermittent dizziness and feeling "weird"  x two months.  Pt states she has tightness and tingling in neck and intermittent sharp pain in head.  Patient also c/o tingling in mid back 03/2017.  No other complaints at this time.

## 2017-09-23 NOTE — ED Notes (Signed)
Patient states that upon sitting and standing she has just "a brief moment of dizziness".

## 2017-09-23 NOTE — ED Provider Notes (Signed)
[facility] EMERGENCY DEPARTMENT Provider Note   CSN: [id number] Arrival date & time: 09/23/17  1355     History   Chief Complaint Chief Complaint  Patient presents with  . Dizziness    HPI Tara Stark is a 37 y.o. female.  Patient complains of dizziness for a month and also some numbness to the upper part of her left back and neck.  Her family doctor has arranged for her to follow-up with neurology next week   The history is provided by the patient. No language interpreter was used.  Dizziness  Quality:  Lightheadedness Severity:  Moderate Onset quality:  Gradual Timing:  Constant Progression:  Worsening Chronicity:  New Context: not with head movement   Relieved by:  Nothing Worsened by:  Nothing Ineffective treatments:  None tried Associated symptoms: no chest pain, no diarrhea and no headaches     Past Medical History:  Diagnosis Date  . Acid indigestion   . Hemorrhoids     There are no active problems to display for this patient.   Past Surgical History:  Procedure Laterality Date  . NO PAST SURGERIES      OB History    Gravida Para Term Preterm AB Living   1 0 0 0 0 0   SAB TAB Ectopic Multiple Live Births   0 0 0 0         Home Medications    Prior to Admission medications   Medication Sig Start Date End Date Taking? Authorizing Provider  levothyroxine (SYNTHROID, LEVOTHROID) 50 MCG tablet Take 50 mcg by mouth daily before breakfast.   Yes [provider]  Multiple Vitamin (MULTIVITAMIN WITH MINERALS) TABS tablet Take 1 tablet by mouth daily.   Yes [provider]  propranolol (INDERAL) 40 MG tablet Take 40 mg by mouth 2 (two) times daily.   Yes [provider]    Family History No family history on file.  Social History Social History   Tobacco Use  . Smoking status: Current Every Day Smoker    Types: Cigarettes    Last attempt to quit: 09/23/2016    Years since quitting: 1.0  . Smokeless tobacco:  Never Used  . Tobacco comment: 2-3 cigarettes per day  Substance Use Topics  . Alcohol use: No  . Drug use: No     Allergies   Patient has no known allergies.   Review of Systems Review of Systems  Constitutional: Negative for appetite change and fatigue.  HENT: Negative for congestion, ear discharge and sinus pressure.   Eyes: Negative for discharge.  Respiratory: Negative for cough.   Cardiovascular: Negative for chest pain.  Gastrointestinal: Negative for abdominal pain and diarrhea.  Genitourinary: Negative for frequency and hematuria.  Musculoskeletal: Negative for back pain.  Skin: Negative for rash.  Neurological: Positive for dizziness. Negative for seizures and headaches.  Psychiatric/Behavioral: Negative for hallucinations.     Physical Exam Updated Vital Signs BP (!) 159/98 (BP Location: Left Arm)   Pulse (!) 57   Temp 98.7 F (37.1 C) (Oral)   Resp 15   Ht 4' 11" (1.499 m)   Wt 72.6 kg (160 lb)   SpO2 100%   BMI 32.32 kg/m   Physical Exam  Constitutional: She is oriented to person, place, and time. She appears well-developed.  HENT:  Head: Normocephalic.  Eyes: Conjunctivae and EOM are normal. No scleral icterus.  Neck: Neck supple. No thyromegaly present.  Cardiovascular: Normal rate and regular rhythm. Exam reveals   no gallop and no friction rub.  No murmur heard. Pulmonary/Chest: No stridor. She has no wheezes. She has no rales. She exhibits no tenderness.  Abdominal: She exhibits no distension. There is no tenderness. There is no rebound.  Musculoskeletal: Normal range of motion. She exhibits no edema.  Lymphadenopathy:    She has no cervical adenopathy.  Neurological: She is oriented to person, place, and time. She exhibits normal muscle tone. Coordination normal.  Skin: No rash noted. No erythema.  Psychiatric: She has a normal mood and affect. Her behavior is normal.     ED Treatments / Results  Labs (all labs ordered are listed, but  only abnormal results are displayed) Labs Reviewed  CBC WITH DIFFERENTIAL/PLATELET  COMPREHENSIVE METABOLIC PANEL    EKG  EKG Interpretation  Date/Time:  Thursday September 23 2017 14:57:29 EST Ventricular Rate:  59 PR Interval:    QRS Duration: 70 QT Interval:  422 QTC Calculation: 418 R Axis:   54 Text Interpretation:  Sinus rhythm Confirmed by Milton Ferguson [phone]) on 09/23/2017 3:24:31 PM       Radiology No results found.  Procedures Procedures (including critical care time)  Medications Ordered in ED Medications - No data to display   Initial Impression / Assessment and Plan / ED Course  I have reviewed the triage vital signs and the nursing notes.  Pertinent labs & imaging results that were available during my care of the patient were reviewed by me and considered in my medical decision making (see chart for details).     Patient with symptoms of dizziness for months along with pain in her left upper back and neck.  CBC C met CT of head chest x-ray all unremarkable.  Patient has some degenerative changes in her neck.  She has an appointment with neurology in a few days and will follow up with them.  Patient may have some pain related to her arthritic changes in her neck  Final Clinical Impressions(s) / ED Diagnoses   Final diagnoses:  None    ED Discharge Orders    None       Milton Ferguson, MD 09/24/17 [id number]

## 2017-10-26 ENCOUNTER — Other Ambulatory Visit: Payer: Self-pay | Admitting: Adult Health

## 2017-10-26 ENCOUNTER — Encounter: Payer: Self-pay | Admitting: *Deleted

## 2018-01-06 ENCOUNTER — Other Ambulatory Visit: Payer: Self-pay

## 2018-01-06 ENCOUNTER — Emergency Department (HOSPITAL_COMMUNITY)
Admission: EM | Admit: 2018-01-06 | Discharge: 2018-01-06 | Disposition: A | Discharge status: Home / Self Care | Payer: Medicaid Other | Attending: Emergency Medicine | Admitting: Emergency Medicine

## 2018-01-06 DIAGNOSIS — Z79899 Other long term (current) drug therapy: Secondary | ICD-10-CM | POA: Insufficient documentation

## 2018-01-06 DIAGNOSIS — R42 Dizziness and giddiness: Secondary | ICD-10-CM | POA: Insufficient documentation

## 2018-01-06 LAB — URINALYSIS, ROUTINE W REFLEX MICROSCOPIC
BILIRUBIN URINE: NEGATIVE
Glucose, UA: NEGATIVE mg/dL
Hgb urine dipstick: NEGATIVE
Ketones, ur: NEGATIVE mg/dL
LEUKOCYTES UA: NEGATIVE
Nitrite: NEGATIVE
Protein, ur: NEGATIVE mg/dL
SPECIFIC GRAVITY, URINE: 1.013 (ref 1.005–1.030)
pH: 7 (ref 5.0–8.0)

## 2018-01-06 LAB — CBC
HCT: 45.7 % (ref 36.0–46.0)
HEMOGLOBIN: 14.9 g/dL (ref 12.0–15.0)
MCH: 27.7 pg (ref 26.0–34.0)
MCHC: 32.6 g/dL (ref 30.0–36.0)
MCV: 85.1 fL (ref 78.0–100.0)
Platelets: 200 10*3/uL (ref 150–400)
RBC: 5.37 MIL/uL — AB (ref 3.87–5.11)
RDW: 15.1 % (ref 11.5–15.5)
WBC: 9.3 10*3/uL (ref 4.0–10.5)

## 2018-01-06 LAB — CBG MONITORING, ED
Glucose-Capillary: 67 mg/dL (ref 65–99)
Glucose-Capillary: 81 mg/dL (ref 65–99)
Glucose-Capillary: 88 mg/dL (ref 65–99)

## 2018-01-06 LAB — BASIC METABOLIC PANEL
ANION GAP: 10 (ref 5–15)
BUN: 8 mg/dL (ref 6–20)
CALCIUM: 9 mg/dL (ref 8.9–10.3)
CO2: 26 mmol/L (ref 22–32)
Chloride: 102 mmol/L (ref 101–111)
Creatinine, Ser: 0.47 mg/dL (ref 0.44–1.00)
GLUCOSE: 109 mg/dL — AB (ref 65–99)
Potassium: 3.7 mmol/L (ref 3.5–5.1)
SODIUM: 138 mmol/L (ref 135–145)

## 2018-01-06 LAB — PREGNANCY, URINE: Preg Test, Ur: NEGATIVE

## 2018-01-06 MED ORDER — PROPRANOLOL HCL 40 MG PO TABS: 40.0000 mg | ORAL_TABLET | Freq: Two times a day (BID) | ORAL | 0 refills | Status: DC

## 2018-01-06 NOTE — ED Triage Notes (Signed)
Patient states she was at work today when begin to feel faint.  Denies LOC.  Patient states she has been unable to fill her blood pressure medications for 3 weeks.

## 2018-01-06 NOTE — ED Provider Notes (Signed)
[facility] EMERGENCY DEPARTMENT Provider Note   CSN: [id number] Arrival date & time: 01/06/18  1808     History   Chief Complaint Chief Complaint  Patient presents with  . Near Syncope    HPI Tara Stark is a 37 y.o. female.  Tara Stark is a 37 y.o. Female with a history of hypertension, hemorrhoids and GERD, who presents to the emergency department for evaluation of near syncope.  Patient reports for the past several months she has had intermittent symptoms where she feels faint and lightheaded, patient denies any episodes of syncope or loss of consciousness.  Patient reports the symptoms often occur with position change.  She reports she tries to drink several bottles of water a day.  She reports the symptoms have primarily been present since her son was born and wonders if this could have anything to do with it, no abnormal menstrual cycles.  Patient is unsure if she is currently pregnant.  Patient does report that she probably does not get enough sleep and does not eat as well as she should.  She denies any associated chest pain or shortness of breath.  Reports she occasionally gets some mild headache which always goes away with ibuprofen denies any vision changes, numbness, weakness tingling, no dizziness.  She denies any fevers or chills, nausea, vomiting, diarrhea or abdominal pain.  Patient reports she was seen in the for the symptoms several months ago and had a very reassuring work-up including normal head CT.     Past Medical History:  Diagnosis Date  . Acid indigestion   . Hemorrhoids     There are no active problems to display for this patient.   Past Surgical History:  Procedure Laterality Date  . NO PAST SURGERIES       OB History    Gravida  1   Para  0   Term  0   Preterm  0   AB  0   Living  0     SAB  0   TAB  0   Ectopic  0   Multiple  0   Live Births               Home Medications    Prior to Admission medications    Medication Sig Start Date End Date Taking? Authorizing Provider  levothyroxine (SYNTHROID, LEVOTHROID) 50 MCG tablet Take 50 mcg by mouth daily before breakfast.    [provider]  Multiple Vitamin (MULTIVITAMIN WITH MINERALS) TABS tablet Take 1 tablet by mouth daily.    [provider]  propranolol (INDERAL) 40 MG tablet Take 40 mg by mouth 2 (two) times daily.    [provider]    Family History No family history on file.  Social History Social History   Tobacco Use  . Smoking status: Current Every Day Smoker    Types: Cigarettes    Last attempt to quit: 09/23/2016    Years since quitting: 1.2  . Smokeless tobacco: Never Used  . Tobacco comment: 2-3 cigarettes per day  Substance Use Topics  . Alcohol use: No  . Drug use: No     Allergies   Patient has no known allergies.   Review of Systems Review of Systems  Constitutional: Negative for chills and fever.  HENT: Negative for congestion, rhinorrhea and sore throat.   Eyes: Negative for photophobia, pain and visual disturbance.  Respiratory: Negative for cough and shortness of breath.   Cardiovascular: Negative  for chest pain and palpitations.  Gastrointestinal: Negative for abdominal pain, nausea and vomiting.  Genitourinary: Negative for dysuria and frequency.  Musculoskeletal: Negative for arthralgias and myalgias.  Skin: Negative for color change and rash.  Neurological: Positive for light-headedness and headaches. Negative for dizziness, tremors, syncope, facial asymmetry, speech difficulty, weakness and numbness.     Physical Exam Updated Vital Signs BP (!) 151/90 (BP Location: Right Arm)   Pulse (!) 103   Temp 98 F (36.7 C) (Oral)   Resp 12   Ht 4\' 11"  (1.499 m)   LMP 11/24/2017   SpO2 100%   BMI 32.32 kg/m   Physical Exam  Constitutional: She is oriented to person, place, and time. She appears well-developed and well-nourished. No distress.  HENT:  Head: Normocephalic  and atraumatic.  Mouth/Throat: Oropharynx is clear and moist.  Eyes: Pupils are equal, round, and reactive to light. EOM are normal. Right eye exhibits no discharge. Left eye exhibits no discharge.  No nystagmus  Neck: Normal range of motion. Neck supple.  Cardiovascular: Normal rate, regular rhythm, normal heart sounds and intact distal pulses.  Pulmonary/Chest: Effort normal and breath sounds normal. No stridor. No respiratory distress. She has no wheezes. She has no rales.  Respirations equal and unlabored, patient able to speak in full sentences, lungs clear to auscultation bilaterally  Abdominal: Soft. Bowel sounds are normal. She exhibits no distension and no mass. There is no tenderness. There is no guarding.  Abdomen soft, nondistended, nontender to palpation in all quadrants without guarding or peritoneal signs  Musculoskeletal: She exhibits no edema or deformity.  Neurological: She is alert and oriented to person, place, and time. Coordination normal.  Speech is clear, able to follow commands CN III-XII intact Normal strength in upper and lower extremities bilaterally including dorsiflexion and plantar flexion, strong and equal grip strength Sensation normal to light and sharp touch Moves extremities without ataxia, coordination intact  Skin: Skin is warm and dry. Capillary refill takes less than 2 seconds. She is not diaphoretic.  Psychiatric: She has a normal mood and affect. Her behavior is normal.  Nursing note and vitals reviewed.    ED Treatments / Results  Labs (all labs ordered are listed, but only abnormal results are displayed) Labs Reviewed  BASIC METABOLIC PANEL - Abnormal; Notable for the following components:      Result Value   Glucose, Bld 109 (*)    All other components within normal limits  CBC - Abnormal; Notable for the following components:   RBC 5.37 (*)    All other components within normal limits  URINALYSIS, ROUTINE W REFLEX MICROSCOPIC    PREGNANCY, URINE  CBG MONITORING, ED  CBG MONITORING, ED  CBG MONITORING, ED    EKG EKG Interpretation  Date/Time:  Thursday January 06 2018 18:28:17 EDT Ventricular Rate:  100 PR Interval:  154 QRS Duration: 82 QT Interval:  358 QTC Calculation: 461 R Axis:   48 Text Interpretation:  Normal sinus rhythm Right atrial enlargement Borderline ECG since 09/23/17, no change Confirmed by Daleen Bo [phone]) on 01/06/2018 10:41:25 PM   Radiology No results found.  Procedures Procedures (including critical care time)  Medications Ordered in ED Medications - No data to display   Initial Impression / Assessment and Plan / ED Course  I have reviewed the triage vital signs and the nursing notes.  Pertinent labs & imaging results that were available during my care of the patient were reviewed by me and considered in my  medical decision making (see chart for details).  Patient presents to the emergency department for evaluation of several months of intermittent sensation of lightheadedness, this is primarily with position change.  On arrival patient is initially mildly tachycardic at 103 with mild hypertension, this resolved without intervention and patient's vitals have remained stable since then.  Patient denies any syncopal episodes, no associated chest pain or shortness of breath she reports some mild headaches but no associated dizziness vision changes or neurologic deficit.  No abdominal pain.  Exam without focal findings.  Patient is not orthostatic, she appears well-hydrated on exam.  Lab evaluation is reassuring.  No leukocytosis, normal hemoglobin, no acute electrolyte derangements, normal renal function, UA without signs of infection, negative pregnancy.  Story not suggestive of any cardiac etiology.  Discussed reassuring work-up with patient, given that she has not had any syncopal episodes and has now had 2 very reassuring work-up for the symptoms stressed the importance of  following up outpatient with primary care provider.  Patient requests refill of her propranolol which she has been out of for the past 3 weeks, will provide 1 month refill of this but discussed need to follow-up with her regular doctor for continued supply of this medication.  Did discuss that patient needs to ensure she is getting enough sleep and adequate nutrition as this could be contributing to her symptoms.  Return precautions discussed.  Patient expresses understanding and is in agreement with plan.   Final Clinical Impressions(s) / ED Diagnoses   Final diagnoses:  Lightheadedness    ED Discharge Orders        Ordered    propranolol (INDERAL) 40 MG tablet  2 times daily     01/06/18 2340       Benedetto Goad [place], [place] 01/07/18 0148    Daleen Bo, MD 01/07/18 [phone]

## 2018-01-06 NOTE — Discharge Instructions (Signed)
Your evaluation today is very reassuring, please follow-up with your primary care doctor for continued evaluation since the symptoms have been ongoing intermittently for the past several months.  Provided a refill of your blood pressure medication, please monitor this daily and keep a log.  Return for worsening symptoms, syncope, chest pain, shortness of breath, severe headaches or any other new or concerning symptoms.

## 2018-03-02 IMAGING — CT CT HEAD W/O CM
3 series · 16 of 47 positions shown, 19 images · non-contrast
Comparison: None.

CLINICAL DATA: Lightheadedness for the past 3 months.

EXAM:
CT HEAD WITHOUT CONTRAST
TECHNIQUE: Contiguous axial images were obtained from the base of the skull
through the vertex without intravenous contrast.

[Series 2: head wo · axial · 0.42mm/px · z∈[+48,+173]mm · 10 of 31 slices shown, 13 images]
[im 3/31  brain]
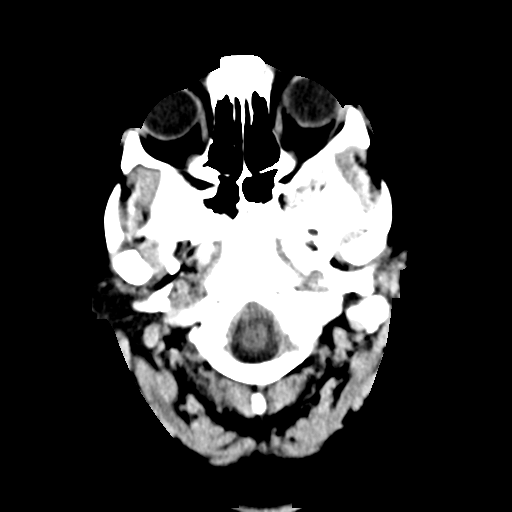
[im 3/31  bone]
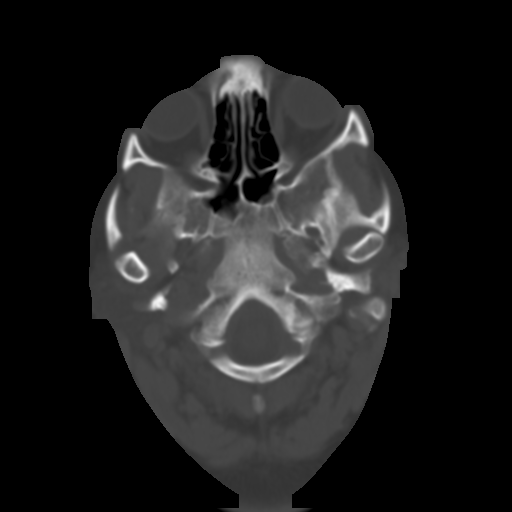
[im 6/31  brain]
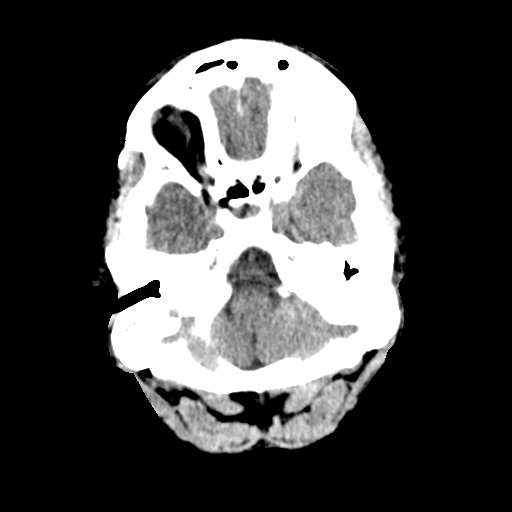
[im 9/31  brain]
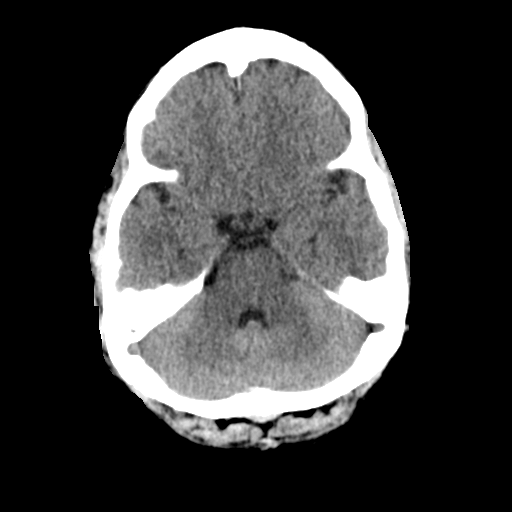
[im 11/31  brain]
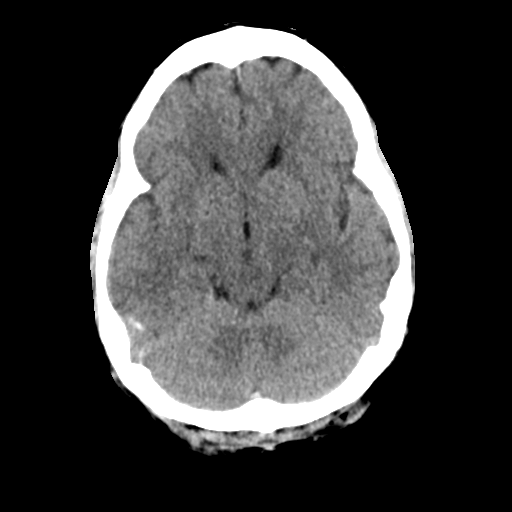
[im 14/31  brain]
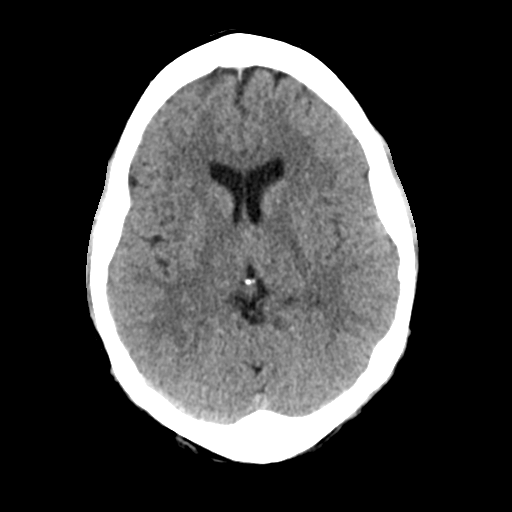
[im 14/31  bone]
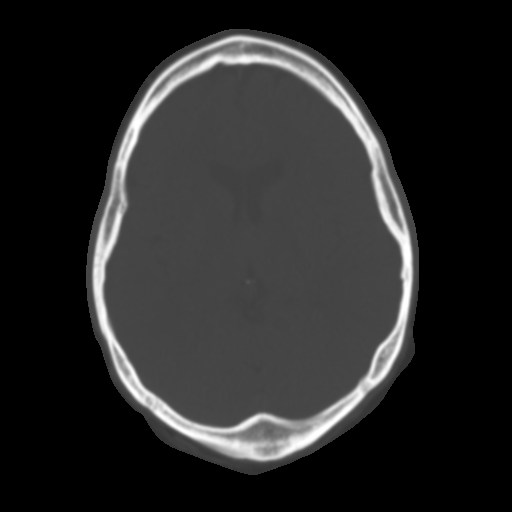
[im 17/31  brain]
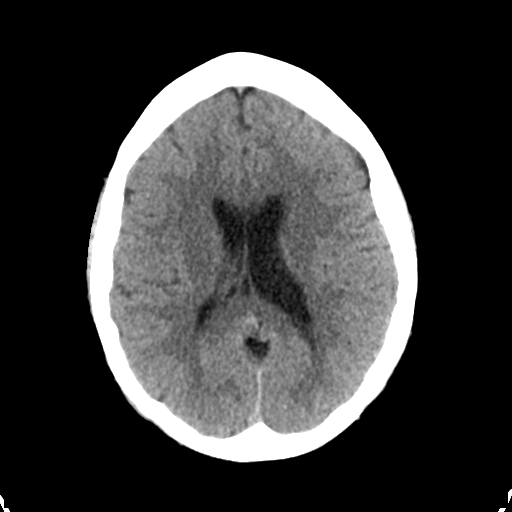
[im 20/31  brain]
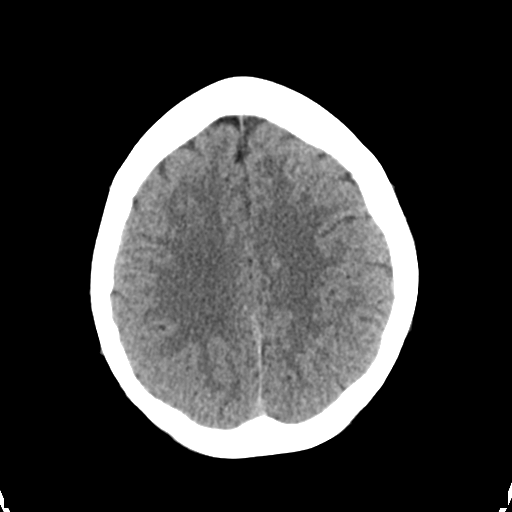
[im 23/31  brain]
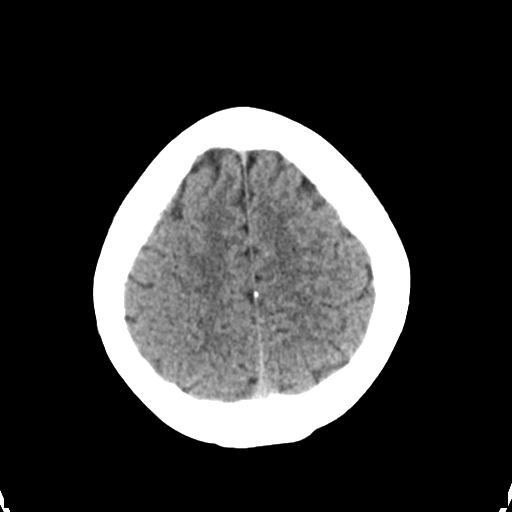
[im 25/31  brain]
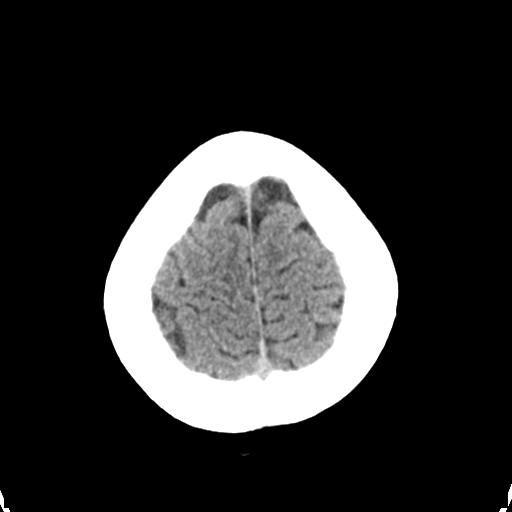
[im 25/31  bone]
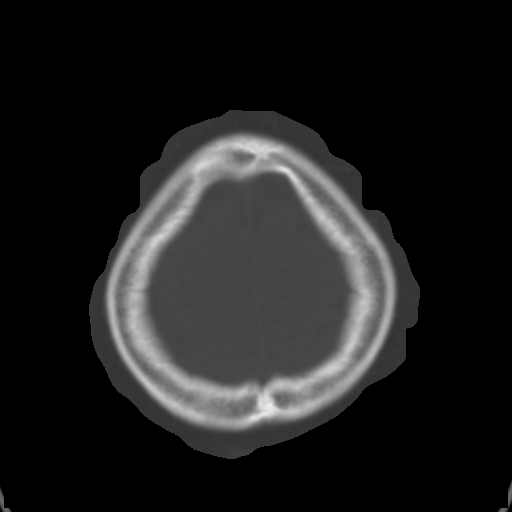
[im 28/31  brain]
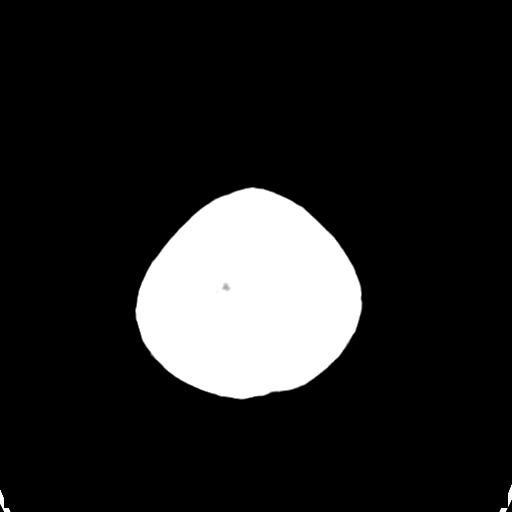

[Series 4: coronal soft tissue · coronal · 0.35mm/px · 3 of 67 slices shown]
[im 23/67  brain]
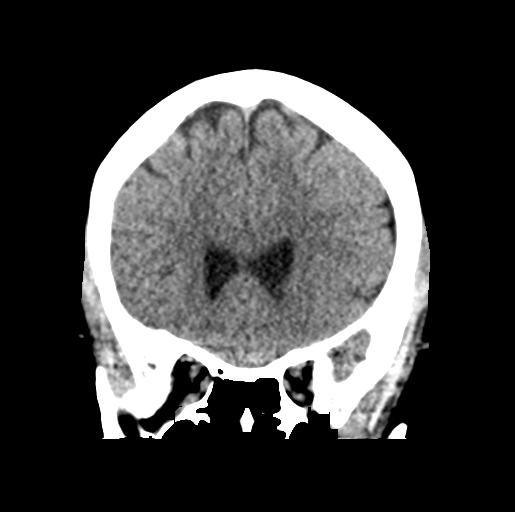
[im 30/67  brain]
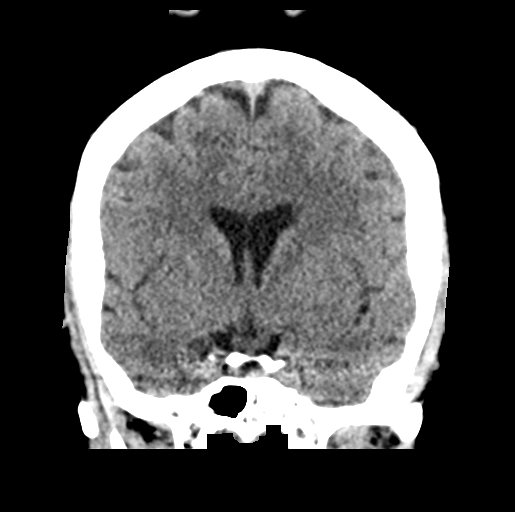
[im 37/67  brain]
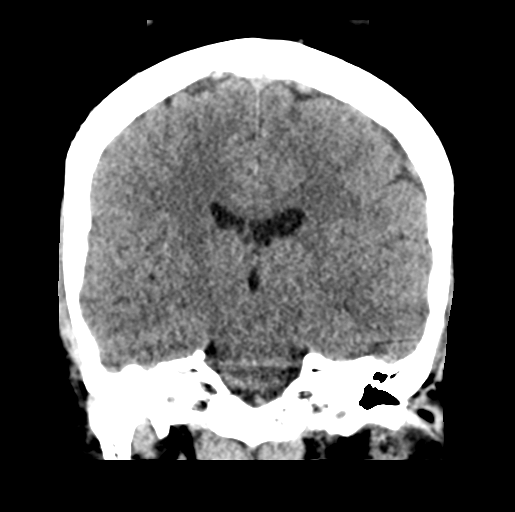

[Series 5: sagittal soft tissue · sagittal · 0.33mm/px · 3 of 67 slices shown]
[im 23/67  brain]
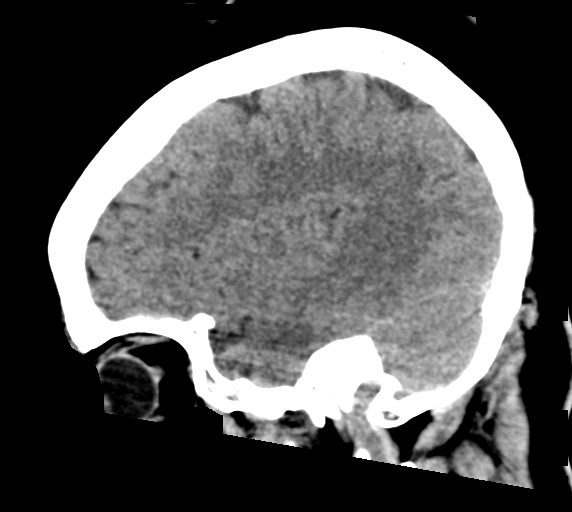
[im 34/67  brain]
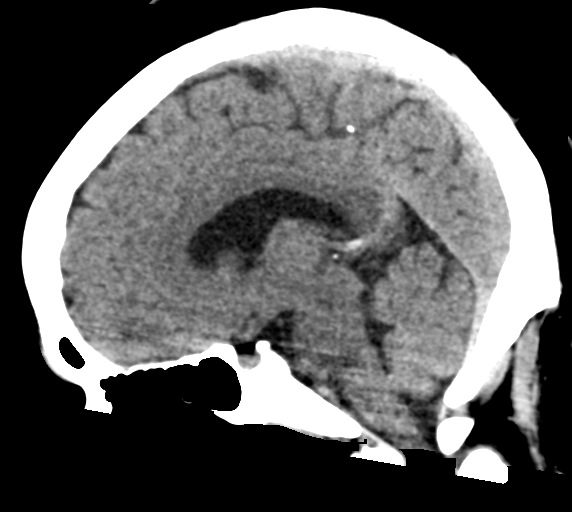
[im 45/67  brain]
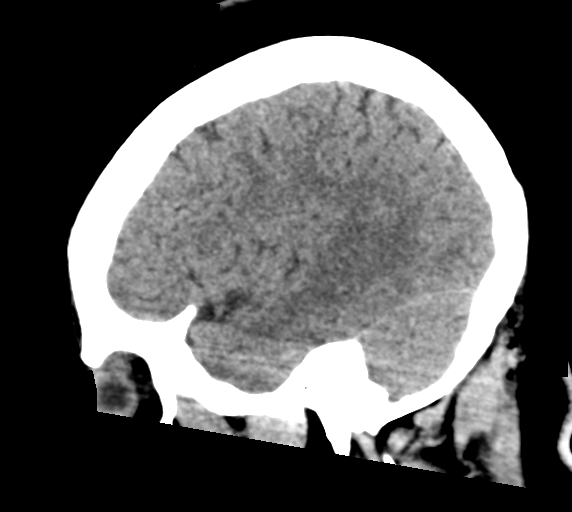

[16 of 47 positions shown; findings below may reference images not displayed]

FINDINGS: Brain: No evidence of acute infarction, hemorrhage, hydrocephalus,
extra-axial collection or mass lesion/mass effect.

Vascular: No hyperdense vessel or unexpected calcification.

Skull: Normal. Negative for fracture or focal lesion.

Sinuses/Orbits: No acute finding.  Underdeveloped mastoid air cells.

Other: None.
IMPRESSION: Normal noncontrast head CT.

## 2018-03-09 ENCOUNTER — Encounter: Payer: Self-pay | Admitting: Adult Health

## 2018-03-09 ENCOUNTER — Ambulatory Visit (INDEPENDENT_AMBULATORY_CARE_PROVIDER_SITE_OTHER): Payer: Medicaid Other | Admitting: Adult Health

## 2018-03-09 ENCOUNTER — Encounter: Payer: Medicaid Other | Admitting: Adult Health

## 2018-03-09 ENCOUNTER — Encounter

## 2018-03-09 VITALS — BP 143/88 | HR 93 | Ht 59.0 in | Wt 184.0 lb

## 2018-03-09 DIAGNOSIS — N926 Irregular menstruation, unspecified: Secondary | ICD-10-CM | POA: Diagnosis not present

## 2018-03-09 DIAGNOSIS — Z3201 Encounter for pregnancy test, result positive: Secondary | ICD-10-CM

## 2018-03-09 DIAGNOSIS — O3680X0 Pregnancy with inconclusive fetal viability, not applicable or unspecified: Secondary | ICD-10-CM | POA: Insufficient documentation

## 2018-03-09 DIAGNOSIS — O0932 Supervision of pregnancy with insufficient antenatal care, second trimester: Secondary | ICD-10-CM

## 2018-03-09 DIAGNOSIS — O09292 Supervision of pregnancy with other poor reproductive or obstetric history, second trimester: Secondary | ICD-10-CM

## 2018-03-09 DIAGNOSIS — F17200 Nicotine dependence, unspecified, uncomplicated: Secondary | ICD-10-CM | POA: Diagnosis not present

## 2018-03-09 DIAGNOSIS — Z3A14 14 weeks gestation of pregnancy: Secondary | ICD-10-CM | POA: Insufficient documentation

## 2018-03-09 DIAGNOSIS — O10919 Unspecified pre-existing hypertension complicating pregnancy, unspecified trimester: Secondary | ICD-10-CM | POA: Insufficient documentation

## 2018-03-09 DIAGNOSIS — E039 Hypothyroidism, unspecified: Secondary | ICD-10-CM

## 2018-03-09 DIAGNOSIS — O09522 Supervision of elderly multigravida, second trimester: Secondary | ICD-10-CM | POA: Insufficient documentation

## 2018-03-09 DIAGNOSIS — Z9889 Other specified postprocedural states: Secondary | ICD-10-CM | POA: Insufficient documentation

## 2018-03-09 DIAGNOSIS — O10912 Unspecified pre-existing hypertension complicating pregnancy, second trimester: Secondary | ICD-10-CM

## 2018-03-09 LAB — POCT URINE PREGNANCY: Preg Test, Ur: POSITIVE — AB

## 2018-03-09 MED ORDER — PRENATAL PLUS 27-1 MG PO TABS: 1.0000 | ORAL_TABLET | Freq: Every day | ORAL | 12 refills | Status: DC

## 2018-03-09 MED ORDER — AMLODIPINE BESYLATE 10 MG PO TABS: 10.0000 mg | ORAL_TABLET | Freq: Every day | ORAL | 12 refills | Status: DC

## 2018-03-09 NOTE — Progress Notes (Signed)
  Subjective:     Patient ID: Tara Stark, female   DOB: Sep 04, 1980, 37 y.o.   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 She has hypertension and has not had meds in about a month, and it has been several months since taking thyroid meds.   Review of Systems +missed periods with 2+HPTs Feels light headed at times   Reviewed past medical,surgical, social and family history. Reviewed medications and allergies.     Objective:   Physical Exam BP (!) 143/88 (BP Location: Left Arm, Patient Position: Sitting, Cuff Size: Normal)   Pulse 93   Ht 4\' 11"  (1.499 m)   Wt 184 lb (83.5 kg)   LMP 11/28/2017   BMI 37.16 kg/m UPT+, about 14+3 weeks by LMP with EDD 09/04/18. Skin warm and dry. Neck: mid line trachea, normal thyroid, good ROM, no lymphadenopathy noted. Lungs: clear to ausculation bilaterally. Cardiovascular: regular rate and rhythm.Abdomen is soft and non tender, could not hear FHT by doppler, will get back in tomorrow for dating [place] and measure cervix. Discussed with Dr Glo Herring , will give good rx card, and rx norvasc 10 mg 1 daily and PNV, can do gummies if wants.  Will get in ASAP for new OB and labs.     Assessment:       ICD-10-CM   1. Pregnancy examination or test, positive result Z32.01 POCT urine pregnancy  2. [redacted] weeks gestation of pregnancy Z3A.14   3. Encounter to determine fetal viability of pregnancy, single or unspecified fetus O36.80X0 US OB Comp + 14 Wk  4. Multigravida of advanced maternal age in second trimester O09.522   5. Smoker F17.200   6. Late prenatal care affecting pregnancy in second trimester O09.32   7. Maternal chronic hypertension in second trimester O10.912   8. History of cerclage, currently pregnant, second trimester O09.292 US OB Comp + 14 Wk  9.  Hypothyroidism, unspecified type E03.9       Plan:     Meds ordered this encounter  Medications  . amLODipine (NORVASC) 10 MG tablet    Sig: Take 1 tablet (10 mg total) by mouth daily.    Dispense:  30 tablet    Refill:  12    Order Specific Question:   Supervising Provider    Answer:   Elonda Husky, LUTHER H [[id number]]  . prenatal vitamin w/FE, FA (PRENATAL 1 + 1) 27-1 MG TABS tablet    Sig: Take 1 tablet by mouth daily at 12 noon.    Dispense:  30 each    Refill:  12    Order Specific Question:   Supervising Provider    Answer:   Florian Buff [[id number]]  Return in 1 day for dating [place] and cervix measurements Return in 1 week for new OB Review handouts on second trimester and by [facility]  Decreasing cigarettes

## 2018-03-09 NOTE — Patient Instructions (Signed)
Second Trimester of Pregnancy The second trimester is from week 13 through week 28, month 4 through 6. This is often the time in pregnancy that you feel your best. Often times, morning sickness has lessened or quit. You may have more energy, and you may get hungry more often. Your unborn baby (fetus) is growing rapidly. At the end of the sixth month, he or she is about 9 inches long and weighs about 1 pounds. You will likely feel the baby move (quickening) between 18 and 20 weeks of pregnancy. Follow these instructions at home:  Avoid all smoking, herbs, and alcohol. Avoid drugs not approved by your doctor.  Do not use any tobacco products, including cigarettes, chewing tobacco, and electronic cigarettes. If you need help quitting, ask your doctor. You may get counseling or other support to help you quit.  Only take medicine as told by your doctor. Some medicines are safe and some are not during pregnancy.  Exercise only as told by your doctor. Stop exercising if you start having cramps.  Eat regular, healthy meals.  Wear a good support bra if your breasts are tender.  Do not use hot tubs, steam rooms, or saunas.  Wear your seat belt when driving.  Avoid raw meat, uncooked cheese, and liter boxes and soil used by cats.  Take your prenatal vitamins.  Take 1500-2000 milligrams of calcium daily starting at the 20th week of pregnancy until you deliver your baby.  Try taking medicine that helps you poop (stool softener) as needed, and if your doctor approves. Eat more fiber by eating fresh fruit, vegetables, and whole grains. Drink enough fluids to keep your pee (urine) clear or pale yellow.  Take warm water baths (sitz baths) to soothe pain or discomfort caused by hemorrhoids. Use hemorrhoid cream if your doctor approves.  If you have puffy, bulging veins (varicose veins), wear support hose. Raise (elevate) your feet for 15 minutes, 3-4 times a day. Limit salt in your diet.  Avoid heavy  lifting, wear low heals, and sit up straight.  Rest with your legs raised if you have leg cramps or low back pain.  Visit your dentist if you have not gone during your pregnancy. Use a soft toothbrush to brush your teeth. Be gentle when you floss.  You can have sex (intercourse) unless your doctor tells you not to.  Go to your doctor visits. Get help if:  You feel dizzy.  You have mild cramps or pressure in your lower belly (abdomen).  You have a nagging pain in your belly area.  You continue to feel sick to your stomach (nauseous), throw up (vomit), or have watery poop (diarrhea).  You have bad smelling fluid coming from your vagina.  You have pain with peeing (urination). Get help right away if:  You have a fever.  You are leaking fluid from your vagina.  You have spotting or bleeding from your vagina.  You have severe belly cramping or pain.  You lose or gain weight rapidly.  You have trouble catching your breath and have chest pain.  You notice sudden or extreme puffiness (swelling) of your face, hands, ankles, feet, or legs.  You have not felt the baby move in over an hour.  You have severe headaches that do not go away with medicine.  You have vision changes. This information is not intended to replace advice given to you by your health care provider. Make sure you discuss any questions you have with your health care   provider. Document Released: 10/07/2009 Document Revised: 12/19/2015 Document Reviewed: 09/13/2012 Elsevier Interactive Patient Education  2017 [employer]  

## 2018-03-10 ENCOUNTER — Ambulatory Visit (INDEPENDENT_AMBULATORY_CARE_PROVIDER_SITE_OTHER): Payer: Medicaid Other

## 2018-03-10 DIAGNOSIS — O3680X0 Pregnancy with inconclusive fetal viability, not applicable or unspecified: Secondary | ICD-10-CM

## 2018-03-10 DIAGNOSIS — O09292 Supervision of pregnancy with other poor reproductive or obstetric history, second trimester: Secondary | ICD-10-CM

## 2018-03-10 NOTE — Progress Notes (Signed)
[place] 7+2 wks single IUP w/ys,positive fht 146 bpm,normal ovaries bilat,fundal fibroid 4.1 x 3.1 x 4.9 cm,crl 11.69 mm

## 2018-03-17 ENCOUNTER — Ambulatory Visit (INDEPENDENT_AMBULATORY_CARE_PROVIDER_SITE_OTHER): Payer: Medicaid Other | Admitting: Women's Health

## 2018-03-17 ENCOUNTER — Other Ambulatory Visit: Payer: Self-pay | Admitting: Adult Health

## 2018-03-17 ENCOUNTER — Encounter: Payer: Self-pay | Admitting: Women's Health

## 2018-03-17 ENCOUNTER — Encounter: Payer: Medicaid Other | Admitting: Adult Health

## 2018-03-17 ENCOUNTER — Ambulatory Visit: Payer: Medicaid Other | Admitting: *Deleted

## 2018-03-17 VITALS — BP 138/87 | HR 91 | Wt 185.0 lb

## 2018-03-17 DIAGNOSIS — Z3682 Encounter for antenatal screening for nuchal translucency: Secondary | ICD-10-CM

## 2018-03-17 DIAGNOSIS — Z3A08 8 weeks gestation of pregnancy: Secondary | ICD-10-CM | POA: Diagnosis not present

## 2018-03-17 DIAGNOSIS — Z1389 Encounter for screening for other disorder: Secondary | ICD-10-CM

## 2018-03-17 DIAGNOSIS — O10911 Unspecified pre-existing hypertension complicating pregnancy, first trimester: Secondary | ICD-10-CM

## 2018-03-17 DIAGNOSIS — B9689 Other specified bacterial agents as the cause of diseases classified elsewhere: Secondary | ICD-10-CM

## 2018-03-17 DIAGNOSIS — Z9889 Other specified postprocedural states: Secondary | ICD-10-CM

## 2018-03-17 DIAGNOSIS — O0990 Supervision of high risk pregnancy, unspecified, unspecified trimester: Secondary | ICD-10-CM

## 2018-03-17 DIAGNOSIS — O09292 Supervision of pregnancy with other poor reproductive or obstetric history, second trimester: Secondary | ICD-10-CM

## 2018-03-17 DIAGNOSIS — E039 Hypothyroidism, unspecified: Secondary | ICD-10-CM | POA: Diagnosis not present

## 2018-03-17 DIAGNOSIS — O10919 Unspecified pre-existing hypertension complicating pregnancy, unspecified trimester: Secondary | ICD-10-CM

## 2018-03-17 DIAGNOSIS — O3680X0 Pregnancy with inconclusive fetal viability, not applicable or unspecified: Secondary | ICD-10-CM

## 2018-03-17 DIAGNOSIS — N760 Acute vaginitis: Secondary | ICD-10-CM

## 2018-03-17 DIAGNOSIS — O0993 Supervision of high risk pregnancy, unspecified, third trimester: Secondary | ICD-10-CM

## 2018-03-17 DIAGNOSIS — O99281 Endocrine, nutritional and metabolic diseases complicating pregnancy, first trimester: Secondary | ICD-10-CM

## 2018-03-17 DIAGNOSIS — Z331 Pregnant state, incidental: Secondary | ICD-10-CM

## 2018-03-17 DIAGNOSIS — O23591 Infection of other part of genital tract in pregnancy, first trimester: Secondary | ICD-10-CM

## 2018-03-17 DIAGNOSIS — Z8759 Personal history of other complications of pregnancy, childbirth and the puerperium: Secondary | ICD-10-CM

## 2018-03-17 LAB — POCT URINALYSIS DIPSTICK OB
Blood, UA: NEGATIVE
Glucose, UA: NEGATIVE — AB
KETONES UA: NEGATIVE
Leukocytes, UA: NEGATIVE
Nitrite, UA: NEGATIVE
POC,PROTEIN,UA: NEGATIVE

## 2018-03-17 MED ORDER — METRONIDAZOLE 500 MG PO TABS: 500.0000 mg | ORAL_TABLET | Freq: Two times a day (BID) | ORAL | 0 refills | Status: DC

## 2018-03-17 MED ORDER — PROGESTERONE MICRONIZED 200 MG PO CAPS: 200.0000 mg | ORAL_CAPSULE | Freq: Every day | ORAL | 5 refills | Status: DC

## 2018-03-17 NOTE — Progress Notes (Signed)
INITIAL OBSTETRICAL VISIT Patient name: Tara Stark MRN [id number]  Date of birth: [birth year] Chief Complaint:   Initial Prenatal Visit (feelings of lightheadedness)  History of Present Illness:   Tara Stark is a 37 y.o. G42P1001 African American female at [redacted]w[redacted]d by 7wk u/s, with an Estimated Date of Delivery: 10/25/18 being seen today for her initial obstetrical visit.   Her obstetrical history is significant for 37wk SVB, 4lb baby, pre-e, had cerclage placed @ 14wks d/t cervical insufficiency.   Today she reports lightheadedness, vaginal itching.  Patient's last menstrual period was 11/28/2017. Last pap 2017 in [place]. Results were: neg per pt Review of Systems:   Pertinent items are noted in HPI Denies cramping/contractions, leakage of fluid, vaginal bleeding, abnormal vaginal discharge w/ itching/odor/irritation, headaches, visual changes, shortness of breath, chest pain, abdominal pain, severe nausea/vomiting, or problems with urination or bowel movements unless otherwise stated above.  Pertinent History Reviewed:  Reviewed past medical,surgical, social, obstetrical and family history.  Reviewed problem list, medications and allergies. OB History  Gravida Para Term Preterm AB Living  2 1 1  0 0 1  SAB TAB Ectopic Multiple Live Births  0 0 0 0 1    # Outcome Date GA Lbr Len/2nd Weight Sex Delivery Anes PTL Lv  2 Current           1 Term 04/03/17 [redacted]w[redacted]d  4 lb (1.814 kg) M Vag-Spont EPI N LIV     Complications: Pre-eclampsia   Physical Assessment:   Vitals:   03/17/18 1432  BP: 138/87  Pulse: 91  Weight: 185 lb (83.9 kg)  Body mass index is 37.37 kg/m.       Physical Examination:  General appearance - well appearing, and in no distress  Mental status - alert, oriented to person, place, and time  Psych:  She has a normal mood and affect  Skin - warm and dry, normal color, no suspicious lesions noted  Chest - effort normal, all lung fields clear to auscultation  bilaterally  Heart - normal rate and regular rhythm  Abdomen - soft, nontender  Extremities:  No swelling or varicosities noted  Pelvic - VULVA: normal appearing vulva with no masses, tenderness or lesions  VAGINA: normal appearing vagina with normal color and thin malodorous discharge, no lesions  CERVIX: appears long and closed, normal appearing cervix without discharge or lesions, no CMT- blue strings coming from os- pt denies having IUD, appears to be cerclage suture, co-exam w/ [facility] who removed w/o difficulty  Thin prep pap is not done  Fetal Heart Rate (bpm): 164 u/s via informal transabdominal u/s  Results for orders placed or performed in visit on 03/17/18 (from the past 24 hour(s))  POC Urinalysis Dipstick OB   Collection Time: 03/17/18  2:56 PM  Result Value Ref Range   Color, UA     Clarity, UA     Glucose, UA Negative (A) (none)   Bilirubin, UA     Ketones, UA neg    Spec Grav, UA     Blood, UA neg    pH, UA     POC Protein UA Negative Negative, Trace   Urobilinogen, UA     Nitrite, UA neg    Leukocytes, UA Negative Negative   Appearance     Odor      Assessment & Plan:  1) High-Risk Pregnancy G2P1001 at [redacted]w[redacted]d with an Estimated Date of Delivery: 10/25/18   2) Initial OB visit  3) CHTN- on norvasc  10mg  (just rx'd by JAG at last visit), will get baseline labs, 24hr urine, start 162mg  asa @ 12wks  4) H/O pre-e- asa 162mg  @ 12wks, baseline labs today  5) H/O cervical insufficiency w/ cerclage @ 14wks> remnant removed today, per LHE prometrium qhs @ 12wks, serial CL u/s  6) H/O ? IUGR> not reported by pt, but baby 4lbs at 37wks, will request all records  7) BV> Rx metronidazole 500mg  BID x 7d for BV, no sex while taking   8) Hypothyroidism> no current meds, check TSH today  Meds:  Meds ordered this encounter  Medications  . progesterone (PROMETRIUM) 200 MG capsule    Sig: Place 1 capsule (200 mg total) vaginally at bedtime. Start @ 12wks of pregnancy (on 04/12/18)      Dispense:  30 capsule    Refill:  5    Order Specific Question:   Supervising Provider    Answer:   Elonda Husky, LUTHER H [[id number]]  . metroNIDAZOLE (FLAGYL) 500 MG tablet    Sig: Take 1 tablet (500 mg total) by mouth 2 (two) times daily.    Dispense:  14 tablet    Refill:  0    Order Specific Question:   Supervising Provider    Answer:   Florian Buff [[id number]]    Initial labs obtained Continue prenatal vitamins Reviewed n/v relief measures and warning s/s to report Reviewed recommended weight gain based on pre-gravid BMI Encouraged well-balanced diet Genetic Screening discussed Integrated Screen: requested Cystic fibrosis screening discussed requested Ultrasound discussed; fetal survey: requested CCNC completed>spoke w/ Vicente Males  Follow-up: Return in about 1 month (around 04/19/2018) for HROB w/ MD, , US:NT+1stIT.   Orders Placed This Encounter  Procedures  . GC/Chlamydia Probe Amp  . Urine Culture  . US Fetal Nuchal Translucency Measurement  . Urinalysis, Routine w reflex microscopic  . Sickle cell screen  . Obstetric Panel, Including HIV  . Cystic Fibrosis Mutation 97  . Pain Management Screening Profile (10S)  . Comprehensive metabolic panel  . TSH  . Protein, urine, 24 hour  . POC Urinalysis Dipstick OB    Roma Schanz CNM, [facility] 03/17/2018 5:02 PM

## 2018-03-17 NOTE — Patient Instructions (Signed)
Tara Stark, I greatly value your feedback.  If you receive a survey following your visit with [place] today, we appreciate you taking the time to fill it out.  Thanks, [name], CNM, WHNP-BC  Begin taking 162mg  (two 81mg  tablets) baby aspirin daily at 12 weeks of pregnancy (9/17) to decrease risk of preeclampsia during pregnancy    Nausea & Vomiting  Have saltine crackers or pretzels by your bed and eat a few bites before you raise your head out of bed in the morning  Eat small frequent meals throughout the day instead of large meals  Drink plenty of fluids throughout the day to stay hydrated, just don't drink a lot of fluids with your meals.  This can make your stomach fill up faster making you feel sick  Do not brush your teeth right after you eat  Products with real ginger are good for nausea, like ginger ale and ginger hard candy Make sure it says made with real ginger!  Sucking on sour candy like lemon heads is also good for nausea  If your prenatal vitamins make you nauseated, take them at night so you will sleep through the nausea  Sea Bands  If you feel like you need medicine for the nausea & vomiting please let us know  If you are unable to keep any fluids or food down please let us know   Constipation  Drink plenty of fluid, preferably water, throughout the day  Eat foods high in fiber such as fruits, vegetables, and grains  Exercise, such as walking, is a good way to keep your bowels regular  Drink warm fluids, especially warm prune juice, or decaf coffee  Eat a 1/2 cup of real oatmeal (not instant), 1/2 cup applesauce, and 1/2-1 cup warm prune juice every day  If needed, you may take Colace (docusate sodium) stool softener once or twice a day to help keep the stool soft. If you are pregnant, wait until you are out of your first trimester (12-14 weeks of pregnancy)  If you still are having problems with constipation, you may take Miralax once daily as needed to  help keep your bowels regular.  If you are pregnant, wait until you are out of your first trimester (12-14 weeks of pregnancy)   First Trimester of Pregnancy The first trimester of pregnancy is from week 1 until the end of week 12 (months 1 through 3). A week after a sperm fertilizes an egg, the egg will implant on the wall of the uterus. This embryo will begin to develop into a baby. Genes from you and your partner are forming the baby. The female genes determine whether the baby is a boy or a girl. At 6-8 weeks, the eyes and face are formed, and the heartbeat can be seen on ultrasound. At the end of 12 weeks, all the baby's organs are formed.  Now that you are pregnant, you will want to do everything you can to have a healthy baby. Two of the most important things are to get good prenatal care and to follow your health care provider's instructions. Prenatal care is all the medical care you receive before the baby's birth. This care will help prevent, find, and treat any problems during the pregnancy and childbirth. BODY CHANGES Your body goes through many changes during pregnancy. The changes vary from woman to woman.   You may gain or lose a couple of pounds at first.  You may feel sick to your stomach (nauseous)  and throw up (vomit). If the vomiting is uncontrollable, call your health care provider.  You may tire easily.  You may develop headaches that can be relieved by medicines approved by your health care provider.  You may urinate more often. Painful urination may mean you have a bladder infection.  You may develop heartburn as a result of your pregnancy.  You may develop constipation because certain hormones are causing the muscles that push waste through your intestines to slow down.  You may develop hemorrhoids or swollen, bulging veins (varicose veins).  Your breasts may begin to grow larger and become tender. Your nipples may stick out more, and the tissue that surrounds them  (areola) may become darker.  Your gums may bleed and may be sensitive to brushing and flossing.  Dark spots or blotches (chloasma, mask of pregnancy) may develop on your face. This will likely fade after the baby is born.  Your menstrual periods will stop.  You may have a loss of appetite.  You may develop cravings for certain kinds of food.  You may have changes in your emotions from day to day, such as being excited to be pregnant or being concerned that something may go wrong with the pregnancy and baby.  You may have more vivid and strange dreams.  You may have changes in your hair. These can include thickening of your hair, rapid growth, and changes in texture. Some women also have hair loss during or after pregnancy, or hair that feels dry or thin. Your hair will most likely return to normal after your baby is born. WHAT TO EXPECT AT YOUR PRENATAL VISITS During a routine prenatal visit:  You will be weighed to make sure you and the baby are growing normally.  Your blood pressure will be taken.  Your abdomen will be measured to track your baby's growth.  The fetal heartbeat will be listened to starting around week 10 or 12 of your pregnancy.  Test results from any previous visits will be discussed. Your health care provider may ask you:  How you are feeling.  If you are feeling the baby move.  If you have had any abnormal symptoms, such as leaking fluid, bleeding, severe headaches, or abdominal cramping.  If you have any questions. Other tests that may be performed during your first trimester include:  Blood tests to find your blood type and to check for the presence of any previous infections. They will also be used to check for low iron levels (anemia) and Rh antibodies. Later in the pregnancy, blood tests for diabetes will be done along with other tests if problems develop.  Urine tests to check for infections, diabetes, or protein in the urine.  An ultrasound to  confirm the proper growth and development of the baby.  An amniocentesis to check for possible genetic problems.  Fetal screens for spina bifida and Down syndrome.  You may need other tests to make sure you and the baby are doing well. HOME CARE INSTRUCTIONS  Medicines  Follow your health care provider's instructions regarding medicine use. Specific medicines may be either safe or unsafe to take during pregnancy.  Take your prenatal vitamins as directed.  If you develop constipation, try taking a stool softener if your health care provider approves. Diet  Eat regular, well-balanced meals. Choose a variety of foods, such as meat or vegetable-based protein, fish, milk and low-fat dairy products, vegetables, fruits, and whole grain breads and cereals. Your health care provider will help  you determine the amount of weight gain that is right for you.  Avoid raw meat and uncooked cheese. These carry germs that can cause birth defects in the baby.  Eating four or five small meals rather than three large meals a day may help relieve nausea and vomiting. If you start to feel nauseous, eating a few soda crackers can be helpful. Drinking liquids between meals instead of during meals also seems to help nausea and vomiting.  If you develop constipation, eat more high-fiber foods, such as fresh vegetables or fruit and whole grains. Drink enough fluids to keep your urine clear or pale yellow. Activity and Exercise  Exercise only as directed by your health care provider. Exercising will help you:  Control your weight.  Stay in shape.  Be prepared for labor and delivery.  Experiencing pain or cramping in the lower abdomen or low back is a good sign that you should stop exercising. Check with your health care provider before continuing normal exercises.  Try to avoid standing for long periods of time. Move your legs often if you must stand in one place for a long time.  Avoid heavy  lifting.  Wear low-heeled shoes, and practice good posture.  You may continue to have sex unless your health care provider directs you otherwise. Relief of Pain or Discomfort  Wear a good support bra for breast tenderness.   Take warm sitz baths to soothe any pain or discomfort caused by hemorrhoids. Use hemorrhoid cream if your health care provider approves.   Rest with your legs elevated if you have leg cramps or low back pain.  If you develop varicose veins in your legs, wear support hose. Elevate your feet for 15 minutes, 3-4 times a day. Limit salt in your diet. Prenatal Care  Schedule your prenatal visits by the twelfth week of pregnancy. They are usually scheduled monthly at first, then more often in the last 2 months before delivery.  Write down your questions. Take them to your prenatal visits.  Keep all your prenatal visits as directed by your health care provider. Safety  Wear your seat belt at all times when driving.  Make a list of emergency phone numbers, including numbers for family, friends, the hospital, and police and fire departments. General Tips  Ask your health care provider for a referral to a local prenatal education class. Begin classes no later than at the beginning of month 6 of your pregnancy.  Ask for help if you have counseling or nutritional needs during pregnancy. Your health care provider can offer advice or refer you to specialists for help with various needs.  Do not use hot tubs, steam rooms, or saunas.  Do not douche or use tampons or scented sanitary pads.  Do not cross your legs for long periods of time.  Avoid cat litter boxes and soil used by cats. These carry germs that can cause birth defects in the baby and possibly loss of the fetus by miscarriage or stillbirth.  Avoid all smoking, herbs, alcohol, and medicines not prescribed by your health care provider. Chemicals in these affect the formation and growth of the baby.  Schedule  a dentist appointment. At home, brush your teeth with a soft toothbrush and be gentle when you floss. SEEK MEDICAL CARE IF:   You have dizziness.  You have mild pelvic cramps, pelvic pressure, or nagging pain in the abdominal area.  You have persistent nausea, vomiting, or diarrhea.  You have a bad smelling vaginal  discharge.  You have pain with urination.  You notice increased swelling in your face, hands, legs, or ankles. SEEK IMMEDIATE MEDICAL CARE IF:   You have a fever.  You are leaking fluid from your vagina.  You have spotting or bleeding from your vagina.  You have severe abdominal cramping or pain.  You have rapid weight gain or loss.  You vomit blood or material that looks like coffee grounds.  You are exposed to [place] measles and have never had them.  You are exposed to fifth disease or chickenpox.  You develop a severe headache.  You have shortness of breath.  You have any kind of trauma, such as from a fall or a car accident. Document Released: 07/07/2001 Document Revised: 11/27/2013 Document Reviewed: 05/23/2013 [facility] Patient Information 2015 [place], [place]. This information is not intended to replace advice given to you by your health care provider. Make sure you discuss any questions you have with your health care provider.

## 2018-03-18 ENCOUNTER — Encounter: Payer: Medicaid Other | Admitting: Adult Health

## 2018-03-18 LAB — COMPREHENSIVE METABOLIC PANEL
A/G RATIO: 1.4 (ref 1.2–2.2)
ALK PHOS: 48 IU/L (ref 39–117)
ALT: 20 IU/L (ref 0–32)
AST: 22 IU/L (ref 0–40)
Albumin: 4.3 g/dL (ref 3.5–5.5)
BUN/Creatinine Ratio: 9 (ref 9–23)
BUN: 4 mg/dL — ABNORMAL LOW (ref 6–20)
Bilirubin Total: <0.2 mg/dL (ref 0.0–1.2)
CO2: 21 mmol/L (ref 20–29)
Calcium: 9.1 mg/dL (ref 8.7–10.2)
Chloride: 102 mmol/L (ref 96–106)
Creatinine, Ser: 0.46 mg/dL — ABNORMAL LOW (ref 0.57–1.00)
GFR calc Af Amer: 148 mL/min/{1.73_m2} (ref >59)
GFR calc non Af Amer: 128 mL/min/{1.73_m2} (ref >59)
Globulin, Total: 3.1 g/dL (ref 1.5–4.5)
Glucose: 96 mg/dL (ref 65–99)
POTASSIUM: 3.9 mmol/L (ref 3.5–5.2)
SODIUM: 138 mmol/L (ref 134–144)
Total Protein: 7.4 g/dL (ref 6.0–8.5)

## 2018-03-18 LAB — TSH: TSH: 0.607 u[IU]/mL (ref 0.450–4.500)

## 2018-03-18 LAB — PMP SCREEN PROFILE (10S), URINE
AMPHETAMINE SCREEN URINE: NEGATIVE ng/mL
BARBITURATE SCREEN URINE: NEGATIVE ng/mL
BENZODIAZEPINE SCREEN, URINE: NEGATIVE ng/mL
CANNABINOIDS UR QL SCN: NEGATIVE ng/mL
COCAINE(METAB.)SCREEN, URINE: NEGATIVE ng/mL
Creatinine(Crt), U: 23.1 mg/dL (ref 20.0–300.0)
Methadone Screen, Urine: NEGATIVE ng/mL
OPIATE SCREEN URINE: NEGATIVE ng/mL
OXYCODONE+OXYMORPHONE UR QL SCN: NEGATIVE ng/mL
PHENCYCLIDINE QUANTITATIVE URINE: NEGATIVE ng/mL
Ph of Urine: 6.8 (ref 4.5–8.9)
Propoxyphene Scrn, Ur: NEGATIVE ng/mL

## 2018-03-19 LAB — URINE CULTURE

## 2018-03-19 LAB — GC/CHLAMYDIA PROBE AMP
Chlamydia trachomatis, NAA: NEGATIVE
NEISSERIA GONORRHOEAE BY PCR: NEGATIVE

## 2018-03-22 ENCOUNTER — Other Ambulatory Visit: Payer: Self-pay | Admitting: Women's Health

## 2018-03-22 DIAGNOSIS — O0990 Supervision of high risk pregnancy, unspecified, unspecified trimester: Secondary | ICD-10-CM | POA: Diagnosis not present

## 2018-03-22 DIAGNOSIS — O10919 Unspecified pre-existing hypertension complicating pregnancy, unspecified trimester: Secondary | ICD-10-CM | POA: Diagnosis not present

## 2018-03-22 DIAGNOSIS — E039 Hypothyroidism, unspecified: Secondary | ICD-10-CM | POA: Diagnosis not present

## 2018-03-23 ENCOUNTER — Telehealth: Payer: Self-pay | Admitting: Obstetrics & Gynecology

## 2018-03-23 LAB — PROTEIN, URINE, 24 HOUR
PROTEIN 24H UR: 223 mg/(24.h) — AB (ref 30–150)
PROTEIN UR: 18.6 mg/dL

## 2018-03-23 NOTE — Telephone Encounter (Signed)
Patient called stating that she is [redacted] weeks pregnant and she is having back pain. Please contact pt.

## 2018-03-23 NOTE — Telephone Encounter (Signed)
Left message @ 12:52 pm. JSY

## 2018-03-23 NOTE — Telephone Encounter (Signed)
Spoke with pt. Pt is [redacted] weeks pregnant. Having pain in low back. Pain started today. Not having any urinary symptoms. I advised can try Tylenol to help with discomfort and can use a heating pad for 15-20 minutes. Pt voiced understanding. [place]

## 2018-03-24 ENCOUNTER — Encounter: Payer: Self-pay | Admitting: Women's Health

## 2018-03-29 LAB — CYSTIC FIBROSIS MUTATION 97: GENE DIS ANAL CARRIER INTERP BLD/T-IMP: NOT DETECTED

## 2018-03-29 LAB — URINALYSIS, ROUTINE W REFLEX MICROSCOPIC
BILIRUBIN UA: NEGATIVE
Glucose, UA: NEGATIVE
KETONES UA: NEGATIVE
LEUKOCYTES UA: NEGATIVE
NITRITE UA: NEGATIVE
Protein, UA: NEGATIVE
RBC UA: NEGATIVE
SPEC GRAV UA: 1.007 (ref 1.005–1.030)
UUROB: 0.2 mg/dL (ref 0.2–1.0)
pH, UA: 7 (ref 5.0–7.5)

## 2018-03-29 LAB — OBSTETRIC PANEL, INCLUDING HIV
Antibody Screen: NEGATIVE
Basophils Absolute: 0 10*3/uL (ref 0.0–0.2)
Basos: 0 %
EOS (ABSOLUTE): 0.1 10*3/uL (ref 0.0–0.4)
EOS: 1 %
HIV Screen 4th Generation wRfx: NONREACTIVE
Hematocrit: 44.3 % (ref 34.0–46.6)
Hemoglobin: 14.5 g/dL (ref 11.1–15.9)
Hepatitis B Surface Ag: NEGATIVE
IMMATURE GRANS (ABS): 0 10*3/uL (ref 0.0–0.1)
IMMATURE GRANULOCYTES: 0 %
LYMPHS: 22 %
Lymphocytes Absolute: 2.8 10*3/uL (ref 0.7–3.1)
MCH: 27.5 pg (ref 26.6–33.0)
MCHC: 32.7 g/dL (ref 31.5–35.7)
MCV: 84 fL (ref 79–97)
MONOS ABS: 0.9 10*3/uL (ref 0.1–0.9)
Monocytes: 7 %
NEUTROS PCT: 70 %
Neutrophils Absolute: 9.1 10*3/uL — ABNORMAL HIGH (ref 1.4–7.0)
Platelets: 257 10*3/uL (ref 150–450)
RBC: 5.28 x10E6/uL (ref 3.77–5.28)
RDW: 15.3 % (ref 12.3–15.4)
RH TYPE: POSITIVE
RPR Ser Ql: NONREACTIVE
Rubella Antibodies, IGG: 4.93 index (ref >0.99)
WBC: 12.9 10*3/uL — ABNORMAL HIGH (ref 3.4–10.8)

## 2018-03-29 LAB — SICKLE CELL SCREEN: SICKLE CELL SCREEN: NEGATIVE

## 2018-03-31 ENCOUNTER — Ambulatory Visit (INDEPENDENT_AMBULATORY_CARE_PROVIDER_SITE_OTHER): Payer: Medicaid Other | Admitting: *Deleted

## 2018-03-31 ENCOUNTER — Encounter: Payer: Self-pay | Admitting: *Deleted

## 2018-03-31 ENCOUNTER — Telehealth: Payer: Self-pay | Admitting: *Deleted

## 2018-03-31 VITALS — BP 126/83 | HR 102 | Ht 59.0 in | Wt 183.5 lb

## 2018-03-31 DIAGNOSIS — Z0130 Encounter for examination of blood pressure without abnormal findings: Secondary | ICD-10-CM

## 2018-03-31 DIAGNOSIS — Z1389 Encounter for screening for other disorder: Secondary | ICD-10-CM

## 2018-03-31 DIAGNOSIS — Z331 Pregnant state, incidental: Secondary | ICD-10-CM

## 2018-03-31 LAB — POCT URINALYSIS DIPSTICK OB
Blood, UA: NEGATIVE
Glucose, UA: NEGATIVE
LEUKOCYTES UA: NEGATIVE
Nitrite, UA: NEGATIVE
POC,PROTEIN,UA: NEGATIVE

## 2018-03-31 NOTE — Progress Notes (Signed)
Pt here for BP visit. BP today was 126/83. Pt reports terrible headache the last 2 days; has no history of headaches. Worse with bending over. I spoke with Maudie Mercury, CNM and she printed out sheet on things to try to help with headache. Advised if no relief, go to hospital. Pt voiced understanding. [place]

## 2018-03-31 NOTE — Telephone Encounter (Signed)
Patient states she has been having headaches unresolved by Tylenol for the past few days. Also states she is having "heart flutters" daily as well. Started noticing symptoms around the time she started taking the Norvasc.  Please advise.

## 2018-03-31 NOTE — Telephone Encounter (Signed)
Spoke to patient and requested she come in today for BP check.  Pt at work but will come in at 2:45 today.

## 2018-04-15 ENCOUNTER — Encounter: Payer: Self-pay | Admitting: Women's Health

## 2018-04-15 DIAGNOSIS — Z8759 Personal history of other complications of pregnancy, childbirth and the puerperium: Secondary | ICD-10-CM | POA: Insufficient documentation

## 2018-04-19 ENCOUNTER — Ambulatory Visit (INDEPENDENT_AMBULATORY_CARE_PROVIDER_SITE_OTHER): Payer: Medicaid Other

## 2018-04-19 ENCOUNTER — Encounter: Payer: Medicaid Other | Admitting: Obstetrics & Gynecology

## 2018-04-19 ENCOUNTER — Ambulatory Visit (INDEPENDENT_AMBULATORY_CARE_PROVIDER_SITE_OTHER): Payer: Medicaid Other | Admitting: Obstetrics & Gynecology

## 2018-04-19 ENCOUNTER — Encounter: Payer: Self-pay | Admitting: Obstetrics & Gynecology

## 2018-04-19 ENCOUNTER — Other Ambulatory Visit: Payer: Self-pay

## 2018-04-19 ENCOUNTER — Other Ambulatory Visit: Payer: Medicaid Other

## 2018-04-19 VITALS — BP 134/87 | HR 95 | Wt 183.0 lb

## 2018-04-19 DIAGNOSIS — Z3A13 13 weeks gestation of pregnancy: Secondary | ICD-10-CM

## 2018-04-19 DIAGNOSIS — Z1389 Encounter for screening for other disorder: Secondary | ICD-10-CM

## 2018-04-19 DIAGNOSIS — O0990 Supervision of high risk pregnancy, unspecified, unspecified trimester: Secondary | ICD-10-CM

## 2018-04-19 DIAGNOSIS — Z331 Pregnant state, incidental: Secondary | ICD-10-CM

## 2018-04-19 DIAGNOSIS — O10919 Unspecified pre-existing hypertension complicating pregnancy, unspecified trimester: Secondary | ICD-10-CM

## 2018-04-19 DIAGNOSIS — Z3682 Encounter for antenatal screening for nuchal translucency: Secondary | ICD-10-CM | POA: Diagnosis not present

## 2018-04-19 DIAGNOSIS — Z23 Encounter for immunization: Secondary | ICD-10-CM

## 2018-04-19 DIAGNOSIS — O10911 Unspecified pre-existing hypertension complicating pregnancy, first trimester: Secondary | ICD-10-CM

## 2018-04-19 DIAGNOSIS — O0991 Supervision of high risk pregnancy, unspecified, first trimester: Secondary | ICD-10-CM

## 2018-04-19 LAB — POCT URINALYSIS DIPSTICK OB
Blood, UA: NEGATIVE
Glucose, UA: NEGATIVE
Ketones, UA: NEGATIVE
LEUKOCYTES UA: NEGATIVE
NITRITE UA: NEGATIVE
PROTEIN: NEGATIVE

## 2018-04-19 NOTE — Progress Notes (Signed)
   HIGH-RISK PREGNANCY VISIT Patient name: Tara Stark MRN [id number]  Date of birth: 01-30-1981 Chief Complaint:   High Risk Gestation (NT today)  History of Present Illness:   Tara Stark is a 37 y.o. G72P1001 female at [redacted]w[redacted]d with an Estimated Date of Delivery: 10/25/18 being seen today for ongoing management of a high-risk pregnancy complicated by chronic HTN.  Today she reports no complaints.  . Vag. Bleeding: None.   . denies leaking of fluid.  Review of Systems:   Pertinent items are noted in HPI Denies abnormal vaginal discharge w/ itching/odor/irritation, headaches, visual changes, shortness of breath, chest pain, abdominal pain, severe nausea/vomiting, or problems with urination or bowel movements unless otherwise stated above. Pertinent History Reviewed:  Reviewed past medical,surgical, social, obstetrical and family history.  Reviewed problem list, medications and allergies. Physical Assessment:   Vitals:   04/19/18 1333  BP: 134/87  Pulse: 95  Weight: 183 lb (83 kg)  Body mass index is 36.96 kg/m.           Physical Examination:   General appearance: alert, well appearing, and in no distress  Mental status: alert, oriented to person, place, and time  Skin: warm & dry   Extremities: Edema: None    Cardiovascular: normal heart rate noted  Respiratory: normal respiratory effort, no distress  Abdomen: gravid, soft, non-tender  Pelvic: Cervical exam deferred         Fetal Status:          Fetal Surveillance Testing today: sonogram normal   Results for orders placed or performed in visit on 04/19/18 (from the past 24 hour(s))  POC Urinalysis Dipstick OB   Collection Time: 04/19/18  1:40 PM  Result Value Ref Range   Color, UA     Clarity, UA     Glucose, UA Negative Negative   Bilirubin, UA     Ketones, UA neg    Spec Grav, UA     Blood, UA neg    pH, UA     POC Protein UA Negative Negative, Trace   Urobilinogen, UA     Nitrite, UA neg    Leukocytes, UA  Negative Negative   Appearance     Odor      Assessment & Plan:  1) High-risk pregnancy G2P1001 at [redacted]w[redacted]d with an Estimated Date of Delivery: 10/25/18   2) CHTN, stable, on Norvasc 10 mg, begin ASA 81 mg  3) Hx of cervical incompetence, stable, begin promerium 200 mg nightly  Meds: No orders of the defined types were placed in this encounter.   Labs/procedures today: sonogram NT normal  Treatment Plan:  1st IT with Nt oday, 2nd IT 4 weeks  Reviewed: Preterm labor symptoms and general obstetric precautions including but not limited to vaginal bleeding, contractions, leaking of fluid and fetal movement were reviewed in detail with the patient.  All questions were answered.  Follow-up: Return in about 4 weeks (around 05/17/2018) for 2nd IT, [place].  Orders Placed This Encounter  Procedures  . Flu Vaccine QUAD 36+ mos IM  . Integrated 1  . POC Urinalysis Dipstick OB   Florian Buff  04/19/2018 2:11 PM

## 2018-04-19 NOTE — Progress Notes (Signed)
[place] 13 wks,measurements c/w dates,crl 74.92 mm,fhr 164 bpm,normal ovaries bilat,anterior placenta,anterior right retroplacental fibroid 6.6 x 4.3 x 5 cm,NB present,NT 1.7 mm

## 2018-04-21 ENCOUNTER — Encounter: Payer: Self-pay | Admitting: Women's Health

## 2018-04-21 DIAGNOSIS — D259 Leiomyoma of uterus, unspecified: Secondary | ICD-10-CM | POA: Insufficient documentation

## 2018-04-21 LAB — INTEGRATED 1
Crown Rump Length: 74.9 mm
Gest. Age on Collection Date: 13.3 weeks
Maternal Age at EDD: 37.6 yr
NUMBER OF FETUSES: 1
Nuchal Translucency (NT): 1.7 mm
PAPP-A VALUE: 2050.9 ng/mL
Weight: 183 [lb_av]

## 2018-05-12 ENCOUNTER — Telehealth: Payer: Self-pay | Admitting: *Deleted

## 2018-05-12 ENCOUNTER — Ambulatory Visit (INDEPENDENT_AMBULATORY_CARE_PROVIDER_SITE_OTHER): Payer: Medicaid Other | Admitting: *Deleted

## 2018-05-12 ENCOUNTER — Encounter: Payer: Self-pay | Admitting: *Deleted

## 2018-05-12 VITALS — BP 134/78 | HR 113 | Wt 187.0 lb

## 2018-05-12 DIAGNOSIS — Z1389 Encounter for screening for other disorder: Secondary | ICD-10-CM

## 2018-05-12 DIAGNOSIS — Z331 Pregnant state, incidental: Secondary | ICD-10-CM

## 2018-05-12 DIAGNOSIS — Z0130 Encounter for examination of blood pressure without abnormal findings: Secondary | ICD-10-CM

## 2018-05-12 LAB — POCT URINALYSIS DIPSTICK OB
Glucose, UA: NEGATIVE
Ketones, UA: NEGATIVE
Nitrite, UA: NEGATIVE
POC,PROTEIN,UA: NEGATIVE
RBC UA: NEGATIVE

## 2018-05-12 NOTE — Progress Notes (Signed)
Pt c/o pulsating feeling on left side of her head. Denies H/A, blurred vision. BP check done. Advised pt to increase her fluid intake. Advised to call back if s/s worsen or fail to improve. Pt verbalized understanding.

## 2018-05-12 NOTE — Telephone Encounter (Signed)
Patient states she started having throbbing head aches and pain near her ears a few days ago.  States she "feels her heart beat" in her head and nervous.  She is taking her BP meds nightly but states her symptoms seem to be worse at night.  She does not check her BP at home but plans on purchasing a cuff from [employer] this weekend.  Will w/i for BP check with nurse.

## 2018-05-17 ENCOUNTER — Ambulatory Visit (INDEPENDENT_AMBULATORY_CARE_PROVIDER_SITE_OTHER): Payer: Medicaid Other | Admitting: Obstetrics & Gynecology

## 2018-05-17 VITALS — BP 126/80 | HR 109 | Wt 187.0 lb

## 2018-05-17 DIAGNOSIS — N72 Inflammatory disease of cervix uteri: Secondary | ICD-10-CM | POA: Diagnosis not present

## 2018-05-17 DIAGNOSIS — O9989 Other specified diseases and conditions complicating pregnancy, childbirth and the puerperium: Secondary | ICD-10-CM

## 2018-05-17 DIAGNOSIS — Z331 Pregnant state, incidental: Secondary | ICD-10-CM

## 2018-05-17 DIAGNOSIS — O10912 Unspecified pre-existing hypertension complicating pregnancy, second trimester: Secondary | ICD-10-CM

## 2018-05-17 DIAGNOSIS — Z1379 Encounter for other screening for genetic and chromosomal anomalies: Secondary | ICD-10-CM | POA: Diagnosis not present

## 2018-05-17 DIAGNOSIS — O0992 Supervision of high risk pregnancy, unspecified, second trimester: Secondary | ICD-10-CM

## 2018-05-17 DIAGNOSIS — O0991 Supervision of high risk pregnancy, unspecified, first trimester: Secondary | ICD-10-CM

## 2018-05-17 DIAGNOSIS — Z1389 Encounter for screening for other disorder: Secondary | ICD-10-CM

## 2018-05-17 DIAGNOSIS — Z3A17 17 weeks gestation of pregnancy: Secondary | ICD-10-CM

## 2018-05-17 MED ORDER — METRONIDAZOLE 500 MG PO TABS: 500.0000 mg | ORAL_TABLET | Freq: Two times a day (BID) | ORAL | 0 refills | Status: DC

## 2018-05-17 NOTE — Progress Notes (Signed)
HIGH-RISK PREGNANCY VISIT Patient name: Tara Stark MRN [id number]  Date of birth: 1980-11-22 Chief Complaint:   Routine Prenatal Visit (2nd IT)  History of Present Illness:   Tara Stark is a 37 y.o. G63P1001 female at [redacted]w[redacted]d with an Estimated Date of Delivery: 10/25/18 being seen today for ongoing management of a high-risk pregnancy complicated by chronic HTN.  Today she reports itchy vaginal discharge, not much discahrge, no odor.  Treated recently for BV 1 motnh ago. Contractions: Not present. Vag. Bleeding: None.  Movement: Absent. denies leaking of fluid.  Review of Systems:   Pertinent items are noted in HPI Denies abnormal vaginal discharge w/ itching/odor/irritation, headaches, visual changes, shortness of breath, chest pain, abdominal pain, severe nausea/vomiting, or problems with urination or bowel movements unless otherwise stated above. Pertinent History Reviewed:  Reviewed past medical,surgical, social, obstetrical and family history.  Reviewed problem list, medications and allergies. Physical Assessment:   Vitals:   05/17/18 0846  BP: 126/80  Pulse: (!) 109  Weight: 187 lb (84.8 kg)  Body mass index is 37.77 kg/m.           Physical Examination:   General appearance: alert, well appearing, and in no distress  Mental status: alert, oriented to person, place, and time  Skin: warm & dry   Extremities: Edema: None    Cardiovascular: normal heart rate noted  Respiratory: normal respiratory effort, no distress  Abdomen: gravid, soft, non-tender  Pelvic: Cervical exam deferred         Fetal Status:     Movement: Absent    Fetal Surveillance Testing today: FHR 155   No results found for this or any previous visit (from the past 24 hour(s)).  Assessment & Plan:  1) High-risk pregnancy G2P1001 at [redacted]w[redacted]d with an Estimated Date of Delivery: 10/25/18   2) Cervicitis, treat with oral metronidazole, as is on vaginal prometrium  3) CHTN, stable, on Norvasc 10  daily  Meds:  Meds ordered this encounter  Medications  . metroNIDAZOLE (FLAGYL) 500 MG tablet    Sig: Take 1 tablet (500 mg total) by mouth 2 (two) times daily.    Dispense:  14 tablet    Refill:  0    Labs/procedures today:  Wet Prep:   A sample of vaginal discharge was obtained from the posterior fornix using a cotton swab. 2 drops of saline were placed on a slide and the cotton swab was immersed in the saline. Microscopic evaluation was performed and results were as follows:  Negative  for yeast  Negative for clue cells , consistent with Bacterial vaginosis Negative for trichomonas  HIGH WBC population   Whiff test: Negative   Treatment Plan:  Metronidazole orally, really is a cervicitis vaginitis, not BV or yeast  Reviewed: Preterm labor symptoms and general obstetric precautions including but not limited to vaginal bleeding, contractions, leaking of fluid and fetal movement were reviewed in detail with the patient.  All questions were answered.  Meds ordered this encounter  Medications  . metroNIDAZOLE (FLAGYL) 500 MG tablet    Sig: Take 1 tablet (500 mg total) by mouth 2 (two) times daily.    Dispense:  14 tablet    Refill:  0   Orders Placed This Encounter  Procedures  . US OB Comp + 14 Wk  . INTEGRATED 2  . POC Urinalysis Dipstick OB   Follow-up: Return in about 2 weeks (around 05/31/2018) for 20 week sono, HROB.  Orders Placed This Encounter  Procedures  . US OB Comp + 14 Wk  . INTEGRATED 2  . POC Urinalysis Dipstick OB   Florian Buff  05/17/2018 9:24 AM

## 2018-05-19 LAB — INTEGRATED 2
AFP MARKER: 42 ng/mL
AFP MOM: 1.19
Crown Rump Length: 74.9 mm
DIA MOM: 1.31
DIA Value: 189.6 pg/mL
Estriol, Unconjugated: 1.05 ng/mL
GEST. AGE ON COLLECTION DATE: 13.3 wk
GESTATIONAL AGE: 17.3 wk
HCG VALUE: 34.8 [IU]/mL
Maternal Age at EDD: 37.6 yr
NUCHAL TRANSLUCENCY (NT): 1.7 mm
NUCHAL TRANSLUCENCY MOM: 0.98
NUMBER OF FETUSES: 1
PAPP-A MoM: 2.16
PAPP-A Value: 2050.9 ng/mL
TEST RESULTS: NEGATIVE
WEIGHT: 183 [lb_av]
Weight: 183 [lb_av]
hCG MoM: 1.42
uE3 MoM: 0.97

## 2018-05-26 ENCOUNTER — Telehealth: Payer: Self-pay | Admitting: Obstetrics & Gynecology

## 2018-05-26 NOTE — Telephone Encounter (Signed)
Please call pt in ref to her burning and itching not getting better/ has appt next week

## 2018-05-26 NOTE — Telephone Encounter (Signed)
Spoke with pt giving Dr. Brynda Greathouse recommendations. Pt voiced understanding. [place]

## 2018-05-26 NOTE — Telephone Encounter (Signed)
Spoke with pt. Pt was prescribed Flagyl for cervicitis. Pt is not any better. Pt finished med yesterday. Pt is using Progesterone vaginally. Please advise. Thanks!! [place]

## 2018-05-26 NOTE — Telephone Encounter (Signed)
Honestly I think it is the progesterone  Reassure her to continue to take the progesterone Her wet preps have been essentially normal It is something she will have to put up with

## 2018-05-30 ENCOUNTER — Other Ambulatory Visit: Payer: Self-pay | Admitting: Obstetrics & Gynecology

## 2018-05-30 DIAGNOSIS — Z363 Encounter for antenatal screening for malformations: Secondary | ICD-10-CM

## 2018-05-31 ENCOUNTER — Ambulatory Visit (INDEPENDENT_AMBULATORY_CARE_PROVIDER_SITE_OTHER): Payer: Medicaid Other

## 2018-05-31 ENCOUNTER — Encounter: Payer: Self-pay | Admitting: Advanced Practice Midwife

## 2018-05-31 ENCOUNTER — Ambulatory Visit (INDEPENDENT_AMBULATORY_CARE_PROVIDER_SITE_OTHER): Payer: Medicaid Other | Admitting: Advanced Practice Midwife

## 2018-05-31 ENCOUNTER — Other Ambulatory Visit: Payer: Self-pay

## 2018-05-31 VITALS — BP 129/82 | HR 103 | Wt 185.0 lb

## 2018-05-31 DIAGNOSIS — O10912 Unspecified pre-existing hypertension complicating pregnancy, second trimester: Secondary | ICD-10-CM

## 2018-05-31 DIAGNOSIS — Z331 Pregnant state, incidental: Secondary | ICD-10-CM

## 2018-05-31 DIAGNOSIS — O99282 Endocrine, nutritional and metabolic diseases complicating pregnancy, second trimester: Secondary | ICD-10-CM

## 2018-05-31 DIAGNOSIS — Z9889 Other specified postprocedural states: Secondary | ICD-10-CM

## 2018-05-31 DIAGNOSIS — Z3A19 19 weeks gestation of pregnancy: Secondary | ICD-10-CM

## 2018-05-31 DIAGNOSIS — Z8759 Personal history of other complications of pregnancy, childbirth and the puerperium: Secondary | ICD-10-CM

## 2018-05-31 DIAGNOSIS — O0990 Supervision of high risk pregnancy, unspecified, unspecified trimester: Secondary | ICD-10-CM

## 2018-05-31 DIAGNOSIS — Z363 Encounter for antenatal screening for malformations: Secondary | ICD-10-CM | POA: Diagnosis not present

## 2018-05-31 DIAGNOSIS — Z1389 Encounter for screening for other disorder: Secondary | ICD-10-CM

## 2018-05-31 DIAGNOSIS — O10919 Unspecified pre-existing hypertension complicating pregnancy, unspecified trimester: Secondary | ICD-10-CM

## 2018-05-31 DIAGNOSIS — E039 Hypothyroidism, unspecified: Secondary | ICD-10-CM

## 2018-05-31 LAB — POCT URINALYSIS DIPSTICK OB
Blood, UA: NEGATIVE
GLUCOSE, UA: NEGATIVE
Leukocytes, UA: NEGATIVE
Nitrite, UA: NEGATIVE
POC,PROTEIN,UA: NEGATIVE

## 2018-05-31 MED ORDER — TERCONAZOLE 0.4 % VA CREA: 1.0000 | TOPICAL_CREAM | Freq: Every day | VAGINAL | 0 refills | Status: DC

## 2018-05-31 NOTE — Progress Notes (Signed)
HIGH-RISK PREGNANCY VISIT Patient name: Tara Stark MRN [id number]  Date of birth: 1981-04-16 Chief Complaint:   Routine Prenatal Visit (u/s today)  History of Present Illness:   Tara Stark is a 38 y.o. G10P1001 female at [redacted]w[redacted]d with an Estimated Date of Delivery: 10/25/18 being seen today for ongoing management of a high-risk pregnancy complicated by chronic HTN, hx short cx and IUGR, on prometrium.  Today she reports persistent vulvovaginal itch/irritaion, dc much worse. Contractions: Not present. Vag. Bleeding: None.  Movement: Absent. denies leaking of fluid.  Review of Systems:   Pertinent items are noted in HPI Denies abnormal vaginal discharge w/ itching/odor/irritation, headaches, visual changes, shortness of breath, chest pain, abdominal pain, severe nausea/vomiting, or problems with urination or bowel movements unless otherwise stated above.    Pertinent History Reviewed:  Medical & Surgical Hx:   Past Medical History:  Diagnosis Date  . Acid indigestion   . Hemorrhoids   . Hypertension   . Thyroid disease    Past Surgical History:  Procedure Laterality Date  . NO PAST SURGERIES     Family History  Problem Relation Age of Onset  . Other Paternal Grandfather        MVA  . Breast cancer Paternal Grandmother   . Alzheimer's disease Maternal Grandmother   . Cancer Maternal Grandfather        throat  . Diabetes Father   . Other Father        has a pacemaker  . Hypertension Mother   . Hypertension Brother   . Hypertension Sister   . Hypertension Sister   . Bronchiolitis Son   . Hypertension Paternal Aunt     Current Outpatient Medications:  .  acetaminophen (TYLENOL) 500 MG tablet, Take 1,000 mg by mouth as needed., Disp: , Rfl:  .  amLODipine (NORVASC) 10 MG tablet, Take 1 tablet (10 mg total) by mouth daily., Disp: 30 tablet, Rfl: 12 .  aspirin EC 81 MG tablet, Take 162 mg by mouth daily., Disp: , Rfl:  .  prenatal vitamin w/FE, FA (PRENATAL 1 + 1) 27-1 MG  TABS tablet, Take 1 tablet by mouth daily at 12 noon., Disp: 30 each, Rfl: 12 .  progesterone (PROMETRIUM) 200 MG capsule, Place 1 capsule (200 mg total) vaginally at bedtime. Start @ 12wks of pregnancy (on 04/12/18), Disp: 30 capsule, Rfl: 5 .  terconazole (TERAZOL 7) 0.4 % vaginal cream, Place 1 applicator vaginally at bedtime., Disp: 45 g, Rfl: 0 Social History: Reviewed -  reports that she has quit smoking. Her smoking use included cigarettes. She quit after 4.00 years of use. She has never used smokeless tobacco.   Physical Assessment:   Vitals:   05/31/18 1150  BP: 129/82  Pulse: (!) 103  Weight: 185 lb (83.9 kg)  Body mass index is 37.37 kg/m.           Physical Examination:   General appearance: alert, well appearing, and in no distress  Mental status: alert, oriented to person, place, and time  Skin: warm & dry   Extremities: Edema: None    Cardiovascular: normal heart rate noted  Respiratory: normal respiratory effort, no distress  Abdomen: gravid, soft, non-tender  Pelvic: heavy discharge c/w yeast. Wet prep + yeast, no clue or WBC or trich.  Vulva irritated         Fetal Status: Fetal Heart Rate (bpm): 144us   Movement: Absent    Fetal Surveillance Testing today: [place] 19 wks,cephalic,cx 3.9 cm,anterior  placenta gr 0,anterior retroplacental fibroid 6 x 4.9 x 3.8 cm,N/C,normal ovaries bilat,svp of fluid 4 cm,fhr 144 bpm,efw 310 g 85%,anatomy complete,no obvious abnormalities   Results for orders placed or performed in visit on 05/31/18 (from the past 24 hour(s))  POC Urinalysis Dipstick OB   Collection Time: 05/31/18 11:50 AM  Result Value Ref Range   Color, UA     Clarity, UA     Glucose, UA Negative Negative   Bilirubin, UA     Ketones, UA mod    Spec Grav, UA     Blood, UA neg    pH, UA     POC Protein UA Negative Negative, Trace   Urobilinogen, UA     Nitrite, UA neg    Leukocytes, UA Negative Negative   Appearance     Odor      Assessment & Plan:  1)  High-risk pregnancy G2P1001 at [redacted]w[redacted]d with an Estimated Date of Delivery: 10/25/18   2) CHTN, stable; continue Norvasc/ASA; Growth u/s @ 28, 34, 38wks     2x/wk testing nst/sono @ 32wks or weekly BPP   Deliver @ 40wks (no meds), 39wks (meds)  3) hx short cx, stable; continue prometrium, use barrier cream (desitin, etc) to vulva to help w/irritation;  cx length q 2 weeks until 28 weeks;  4) Yeast vaginitis: terezol qam x7  5) retroplacental fibroids, stable  Labs/procedures today: [place]   Follow-up: Return in about 2 weeks (around 06/14/2018) for HROB, cervical length.  Orders Placed This Encounter  Procedures  . POC Urinalysis Dipstick OB   Christin Fudge CNM 05/31/2018 12:25 PM

## 2018-05-31 NOTE — Progress Notes (Signed)
[place] 19 wks,cephalic,cx 3.9 cm,anterior placenta gr 0,anterior retroplacental fibroid 6 x 4.9 x 3.8 cm,N/C,normal ovaries bilat,svp of fluid 4 cm,fhr 144 bpm,efw 310 g 85%,anatomy complete,no obvious abnormalities

## 2018-06-02 ENCOUNTER — Telehealth: Payer: Self-pay | Admitting: *Deleted

## 2018-06-02 ENCOUNTER — Telehealth: Payer: Self-pay | Admitting: Advanced Practice Midwife

## 2018-06-02 NOTE — Telephone Encounter (Signed)
LMOM returning pts call

## 2018-06-02 NOTE — Telephone Encounter (Signed)
Has some questions about what she can take for cold and does not know how to take some kind of cream

## 2018-06-02 NOTE — Telephone Encounter (Signed)
Pt called wanting to verify how long she should use the terazol vaginal cream. Advised pt, according to [place] note, to take for 7 days. She also c/o nasal congestion and cough and asks what she can take for that besides sudafed. Advised to try saline nasal spray, guaifenesin, mucinex. Advised to call back if s/s didn't improve or if any other s/s develop. Pt verbalized understanding.

## 2018-06-13 ENCOUNTER — Other Ambulatory Visit: Payer: Self-pay | Admitting: Obstetrics & Gynecology

## 2018-06-13 DIAGNOSIS — Z0375 Encounter for suspected cervical shortening ruled out: Secondary | ICD-10-CM

## 2018-06-14 ENCOUNTER — Encounter: Payer: Self-pay | Admitting: Obstetrics & Gynecology

## 2018-06-14 ENCOUNTER — Ambulatory Visit (INDEPENDENT_AMBULATORY_CARE_PROVIDER_SITE_OTHER): Payer: Medicaid Other

## 2018-06-14 ENCOUNTER — Ambulatory Visit (INDEPENDENT_AMBULATORY_CARE_PROVIDER_SITE_OTHER): Payer: Medicaid Other | Admitting: Obstetrics & Gynecology

## 2018-06-14 ENCOUNTER — Other Ambulatory Visit: Payer: Self-pay | Admitting: Obstetrics & Gynecology

## 2018-06-14 VITALS — BP 120/80 | HR 97 | Wt 186.5 lb

## 2018-06-14 DIAGNOSIS — Z0375 Encounter for suspected cervical shortening ruled out: Secondary | ICD-10-CM | POA: Diagnosis not present

## 2018-06-14 DIAGNOSIS — D259 Leiomyoma of uterus, unspecified: Secondary | ICD-10-CM

## 2018-06-14 DIAGNOSIS — O10912 Unspecified pre-existing hypertension complicating pregnancy, second trimester: Secondary | ICD-10-CM

## 2018-06-14 DIAGNOSIS — O10919 Unspecified pre-existing hypertension complicating pregnancy, unspecified trimester: Secondary | ICD-10-CM

## 2018-06-14 DIAGNOSIS — O3410 Maternal care for benign tumor of corpus uteri, unspecified trimester: Secondary | ICD-10-CM

## 2018-06-14 DIAGNOSIS — Z9889 Other specified postprocedural states: Secondary | ICD-10-CM

## 2018-06-14 DIAGNOSIS — Z1389 Encounter for screening for other disorder: Secondary | ICD-10-CM

## 2018-06-14 DIAGNOSIS — Z3A21 21 weeks gestation of pregnancy: Secondary | ICD-10-CM

## 2018-06-14 DIAGNOSIS — Z331 Pregnant state, incidental: Secondary | ICD-10-CM

## 2018-06-14 DIAGNOSIS — O0992 Supervision of high risk pregnancy, unspecified, second trimester: Secondary | ICD-10-CM

## 2018-06-14 DIAGNOSIS — O0990 Supervision of high risk pregnancy, unspecified, unspecified trimester: Secondary | ICD-10-CM

## 2018-06-14 DIAGNOSIS — D251 Intramural leiomyoma of uterus: Secondary | ICD-10-CM

## 2018-06-14 DIAGNOSIS — O09292 Supervision of pregnancy with other poor reproductive or obstetric history, second trimester: Secondary | ICD-10-CM

## 2018-06-14 LAB — POCT URINALYSIS DIPSTICK OB
Ketones, UA: NEGATIVE
Leukocytes, UA: NEGATIVE
Nitrite, UA: NEGATIVE
POC,PROTEIN,UA: NEGATIVE
RBC UA: NEGATIVE

## 2018-06-14 NOTE — Progress Notes (Signed)
[place]/BR:49 wks,cephalic,anterior retroplacental fibroid 7.4 x 5.7 x 4.3 cm (increased in size),anterior placenta gr 0,normal ovaries bilat,svp of fluid 6.8 cm,cx fhr 152 bpm,cx length 5.3 cm w/and w/o pressure

## 2018-06-14 NOTE — Progress Notes (Signed)
   HIGH-RISK PREGNANCY VISIT Patient name: Tara Stark MRN [id number]  Date of birth: Oct 16, 1980 Chief Complaint:   High Risk Gestation ([place] today)  History of Present Illness:   Tara Stark is a 37 y.o. G44P1001 female at [redacted]w[redacted]d with an Estimated Date of Delivery: 10/25/18 being seen today for ongoing management of a high-risk pregnancy complicated by chronic HTN, hx of cervical incompetency.  Today she reports no complaints. Contractions: Not present. Vag. Bleeding: None.  Movement: Present. denies leaking of fluid.  Review of Systems:   Pertinent items are noted in HPI Denies abnormal vaginal discharge w/ itching/odor/irritation, headaches, visual changes, shortness of breath, chest pain, abdominal pain, severe nausea/vomiting, or problems with urination or bowel movements unless otherwise stated above. Pertinent History Reviewed:  Reviewed past medical,surgical, social, obstetrical and family history.  Reviewed problem list, medications and allergies. Physical Assessment:   Vitals:   06/14/18 0952  BP: 120/80  Pulse: 97  Weight: 186 lb 8 oz (84.6 kg)  Body mass index is 37.67 kg/m.           Physical Examination:   General appearance: alert, well appearing, and in no distress  Mental status: alert, oriented to person, place, and time  Skin: warm & dry   Extremities: Edema: None    Cardiovascular: normal heart rate noted  Respiratory: normal respiratory effort, no distress  Abdomen: gravid, soft, non-tender  Pelvic: Cervical exam deferred         Fetal Status:     Movement: Present    Fetal Surveillance Testing today: sonogram   Results for orders placed or performed in visit on 06/14/18 (from the past 24 hour(s))  POC Urinalysis Dipstick OB   Collection Time: 06/14/18  9:54 AM  Result Value Ref Range   Color, UA     Clarity, UA     Glucose, UA Small (1+) (A) Negative   Bilirubin, UA     Ketones, UA neg    Spec Grav, UA     Blood, UA neg    pH, UA     POC,PROTEIN,UA Negative Negative, Trace, Small (1+), Moderate (2+), Large (3+), 4+   Urobilinogen, UA     Nitrite, UA neg    Leukocytes, UA Negative Negative   Appearance     Odor      Assessment & Plan:  1) High-risk pregnancy G2P1001 at [redacted]w[redacted]d with an Estimated Date of Delivery: 10/25/18   2) Hx of cervical incompetence, stable, on prometrium 200 mg at bedtime  3) CHTN, stable, norvasc 10 mg + baby ASA  Meds: No orders of the defined types were placed in this encounter.   Labs/procedures today: sonogram   Treatment Plan:  Recheck cervical length at 24 weeks, EFW 28 weeks  Reviewed: Preterm labor symptoms and general obstetric precautions including but not limited to vaginal bleeding, contractions, leaking of fluid and fetal movement were reviewed in detail with the patient.  All questions were answered.  Follow-up: Return in about 3 weeks (around 07/05/2018) for sonogram to measure cervix, , HROB.  Orders Placed This Encounter  Procedures  . US OB Limited  . POC Urinalysis Dipstick OB   Florian Buff  06/14/2018 10:17 AM

## 2018-07-05 ENCOUNTER — Ambulatory Visit (INDEPENDENT_AMBULATORY_CARE_PROVIDER_SITE_OTHER): Payer: Medicaid Other

## 2018-07-05 ENCOUNTER — Encounter: Payer: Self-pay | Admitting: Obstetrics & Gynecology

## 2018-07-05 ENCOUNTER — Other Ambulatory Visit: Payer: Self-pay | Admitting: Obstetrics & Gynecology

## 2018-07-05 ENCOUNTER — Ambulatory Visit (INDEPENDENT_AMBULATORY_CARE_PROVIDER_SITE_OTHER): Payer: Medicaid Other | Admitting: Obstetrics & Gynecology

## 2018-07-05 VITALS — BP 117/81 | HR 96 | Wt 187.0 lb

## 2018-07-05 DIAGNOSIS — Z9889 Other specified postprocedural states: Secondary | ICD-10-CM

## 2018-07-05 DIAGNOSIS — Z3A24 24 weeks gestation of pregnancy: Secondary | ICD-10-CM

## 2018-07-05 DIAGNOSIS — O26872 Cervical shortening, second trimester: Secondary | ICD-10-CM

## 2018-07-05 DIAGNOSIS — D259 Leiomyoma of uterus, unspecified: Secondary | ICD-10-CM

## 2018-07-05 DIAGNOSIS — O3410 Maternal care for benign tumor of corpus uteri, unspecified trimester: Secondary | ICD-10-CM

## 2018-07-05 DIAGNOSIS — O10919 Unspecified pre-existing hypertension complicating pregnancy, unspecified trimester: Secondary | ICD-10-CM

## 2018-07-05 DIAGNOSIS — O0992 Supervision of high risk pregnancy, unspecified, second trimester: Secondary | ICD-10-CM

## 2018-07-05 DIAGNOSIS — Z331 Pregnant state, incidental: Secondary | ICD-10-CM

## 2018-07-05 DIAGNOSIS — O0990 Supervision of high risk pregnancy, unspecified, unspecified trimester: Secondary | ICD-10-CM

## 2018-07-05 DIAGNOSIS — O10912 Unspecified pre-existing hypertension complicating pregnancy, second trimester: Secondary | ICD-10-CM

## 2018-07-05 DIAGNOSIS — Z1389 Encounter for screening for other disorder: Secondary | ICD-10-CM

## 2018-07-05 LAB — POCT URINALYSIS DIPSTICK OB
Blood, UA: NEGATIVE
Glucose, UA: NEGATIVE
Leukocytes, UA: NEGATIVE
NITRITE UA: NEGATIVE

## 2018-07-05 NOTE — Progress Notes (Signed)
[place] 24 wks,breech,anterior placenta gr 0,anterior retroplacental fibroid  7.1 x 5.9 x 4.5 cm,svp of fluid 5.2 cm,fhr 138 bpm,normal ovaries bilat,cervical shortening w/funneling,cervical length 2.1-1.6 cm w/pressure,fhr 138 bpm

## 2018-07-05 NOTE — Progress Notes (Signed)
   [place] PREGNANCY VISIT Patient name: Tara Stark MRN [id number]  Date of birth: 1980/11/14 Chief Complaint:   Routine Prenatal Visit ([place] today)  History of Present Illness:   Tara Stark is a 37 y.o. G5P1001 female at [redacted]w[redacted]d with an Estimated Date of Delivery: 10/25/18 being seen today for ongoing management of a high-risk pregnancy complicated by hx of cervical incomepetence and CHTN.  Today she reports head cold sx. Contractions: Not present. Vag. Bleeding: None.  Movement: Present. denies leaking of fluid.  Review of Systems:   Pertinent items are noted in HPI Denies abnormal vaginal discharge w/ itching/odor/irritation, headaches, visual changes, shortness of breath, chest pain, abdominal pain, severe nausea/vomiting, or problems with urination or bowel movements unless otherwise stated above. Pertinent History Reviewed:  Reviewed past medical,surgical, social, obstetrical and family history.  Reviewed problem list, medications and allergies. Physical Assessment:   Vitals:   07/05/18 1154  BP: 117/81  Pulse: 96  Weight: 187 lb (84.8 kg)  Body mass index is 37.77 kg/m.           Physical Examination:   General appearance: alert, well appearing, and in no distress  Mental status: alert, oriented to person, place, and time  Skin: warm & dry   Extremities: Edema: None    Cardiovascular: normal heart rate noted  Respiratory: normal respiratory effort, no distress  Abdomen: gravid, soft, non-tender  Pelvic: Cervical exam deferred         Fetal Status:     Movement: Present    Fetal Surveillance Testing today: sonogram   Results for orders placed or performed in visit on 07/05/18 (from the past 24 hour(s))  POC Urinalysis Dipstick OB   Collection Time: 07/05/18 11:41 AM  Result Value Ref Range   Color, UA     Clarity, UA     Glucose, UA Negative Negative   Bilirubin, UA     Ketones, UA small    Spec Grav, UA     Blood, UA neg    pH, UA     POC,PROTEIN,UA  Trace Negative, Trace, Small (1+), Moderate (2+), Large (3+), 4+   Urobilinogen, UA     Nitrite, UA neg    Leukocytes, UA Negative Negative   Appearance     Odor      Assessment & Plan:  1) High-risk pregnancy G2P1001 at [redacted]w[redacted]d with an Estimated Date of Delivery: 10/25/18   2) Cervical shortening, worse compared to previous but still >1.5 cm, 2 cm generally with some lower segment funelling and tubidity  3) CHTN, stable on norvasc 10 mg + baby ASA  Meds: No orders of the defined types were placed in this encounter.   Labs/procedures today: sonogram see report  Treatment Plan:  Repeat CL 1 week, continue norvasc 10  Reviewed: Preterm labor symptoms and general obstetric precautions including but not limited to vaginal bleeding, contractions, leaking of fluid and fetal movement were reviewed in detail with the patient.  All questions were answered.  Follow-up: Return in about 1 week (around 07/12/2018) for cervical length sonogram, , HROB, with Dr Elonda Husky.  Orders Placed This Encounter  Procedures  . POC Urinalysis Dipstick OB   [name] Verbena Boeding  07/05/2018 12:14 PM

## 2018-07-11 ENCOUNTER — Other Ambulatory Visit: Payer: Self-pay | Admitting: Obstetrics & Gynecology

## 2018-07-11 DIAGNOSIS — O26879 Cervical shortening, unspecified trimester: Secondary | ICD-10-CM

## 2018-07-12 ENCOUNTER — Other Ambulatory Visit: Payer: Self-pay | Admitting: Obstetrics & Gynecology

## 2018-07-12 ENCOUNTER — Ambulatory Visit (INDEPENDENT_AMBULATORY_CARE_PROVIDER_SITE_OTHER): Payer: Medicaid Other | Admitting: Advanced Practice Midwife

## 2018-07-12 ENCOUNTER — Other Ambulatory Visit: Payer: Self-pay

## 2018-07-12 ENCOUNTER — Encounter (INDEPENDENT_AMBULATORY_CARE_PROVIDER_SITE_OTHER): Payer: Self-pay

## 2018-07-12 ENCOUNTER — Ambulatory Visit (INDEPENDENT_AMBULATORY_CARE_PROVIDER_SITE_OTHER): Payer: Medicaid Other

## 2018-07-12 ENCOUNTER — Encounter: Payer: Self-pay | Admitting: Advanced Practice Midwife

## 2018-07-12 VITALS — BP 120/77 | HR 97 | Wt 187.0 lb

## 2018-07-12 DIAGNOSIS — Z331 Pregnant state, incidental: Secondary | ICD-10-CM

## 2018-07-12 DIAGNOSIS — O26879 Cervical shortening, unspecified trimester: Secondary | ICD-10-CM | POA: Diagnosis not present

## 2018-07-12 DIAGNOSIS — Z1389 Encounter for screening for other disorder: Secondary | ICD-10-CM

## 2018-07-12 DIAGNOSIS — Z3A25 25 weeks gestation of pregnancy: Secondary | ICD-10-CM

## 2018-07-12 DIAGNOSIS — O3410 Maternal care for benign tumor of corpus uteri, unspecified trimester: Secondary | ICD-10-CM | POA: Diagnosis not present

## 2018-07-12 DIAGNOSIS — D259 Leiomyoma of uterus, unspecified: Secondary | ICD-10-CM | POA: Diagnosis not present

## 2018-07-12 DIAGNOSIS — Z9889 Other specified postprocedural states: Secondary | ICD-10-CM

## 2018-07-12 DIAGNOSIS — O0990 Supervision of high risk pregnancy, unspecified, unspecified trimester: Secondary | ICD-10-CM

## 2018-07-12 DIAGNOSIS — O10912 Unspecified pre-existing hypertension complicating pregnancy, second trimester: Secondary | ICD-10-CM

## 2018-07-12 DIAGNOSIS — Z8759 Personal history of other complications of pregnancy, childbirth and the puerperium: Secondary | ICD-10-CM

## 2018-07-12 DIAGNOSIS — D251 Intramural leiomyoma of uterus: Secondary | ICD-10-CM

## 2018-07-12 DIAGNOSIS — O0992 Supervision of high risk pregnancy, unspecified, second trimester: Secondary | ICD-10-CM

## 2018-07-12 DIAGNOSIS — O10919 Unspecified pre-existing hypertension complicating pregnancy, unspecified trimester: Secondary | ICD-10-CM

## 2018-07-12 LAB — POCT URINALYSIS DIPSTICK OB
Blood, UA: NEGATIVE
Glucose, UA: NEGATIVE
Ketones, UA: NEGATIVE
NITRITE UA: NEGATIVE
PROTEIN: NEGATIVE

## 2018-07-12 MED ORDER — TERCONAZOLE 0.4 % VA CREA: 1.0000 | TOPICAL_CREAM | Freq: Every day | VAGINAL | 0 refills | Status: DC

## 2018-07-12 NOTE — Progress Notes (Signed)
[place] discharge with no odor.

## 2018-07-12 NOTE — Progress Notes (Signed)
[place] 25 wks,breech,anterior placenta gr 2,short cx w/funneling 2.4-1.57 cm w/and w/o pressure,svp of fluid 6.2 cm, retroplacental anterior fibroid n/c 7.6 x 5.8 x 4.4 cm,fhr 152 bpm

## 2018-07-12 NOTE — Progress Notes (Addendum)
HIGH-RISK PREGNANCY VISIT Patient name: Tara Stark MRN [id number]  Date of birth: [birth year] Chief Complaint:   High Risk Gestation (u/s today)  History of Present Illness:   Tara Stark is a 37 y.o. G95P1001 female at [redacted]w[redacted]d with an Estimated Date of Delivery: 10/25/18 being seen today for ongoing management of a high-risk pregnancy complicated by chronic HTN, SHORT CX.  Today she reports cottage cheese dishcarge and vaginal itching again. . Contractions: Not present. Vag. Bleeding: None.  Movement: Present. denies leaking of fluid.  Review of Systems:   Pertinent items are noted in HPI Denies abnormal vaginal discharge w/ itching/odor/irritation, headaches, visual changes, shortness of breath, chest pain, abdominal pain, severe nausea/vomiting, or problems with urination or bowel movements unless otherwise stated above.    Pertinent History Reviewed:  Medical & Surgical Hx:   Past Medical History:  Diagnosis Date  . Acid indigestion   . Hemorrhoids   . Hypertension   . Thyroid disease    Past Surgical History:  Procedure Laterality Date  . NO PAST SURGERIES     Family History  Problem Relation Age of Onset  . Other Paternal Grandfather        MVA  . Breast cancer Paternal Grandmother   . Alzheimer's disease Maternal Grandmother   . Cancer Maternal Grandfather        throat  . Diabetes Father   . Other Father        has a pacemaker  . Hypertension Mother   . Hypertension Brother   . Hypertension Sister   . Hypertension Sister   . Bronchiolitis Son   . Hypertension Paternal Aunt     Current Outpatient Medications:  .  acetaminophen (TYLENOL) 500 MG tablet, Take 1,000 mg by mouth as needed., Disp: , Rfl:  .  amLODipine (NORVASC) 10 MG tablet, Take 1 tablet (10 mg total) by mouth daily., Disp: 30 tablet, Rfl: 12 .  aspirin EC 81 MG tablet, Take 162 mg by mouth daily., Disp: , Rfl:  .  prenatal vitamin w/FE, FA (PRENATAL 1 + 1) 27-1 MG TABS tablet, Take 1 tablet by  mouth daily at 12 noon., Disp: 30 each, Rfl: 12 .  progesterone (PROMETRIUM) 200 MG capsule, Place 1 capsule (200 mg total) vaginally at bedtime. Start @ 12wks of pregnancy (on 04/12/18), Disp: 30 capsule, Rfl: 5 .  terconazole (TERAZOL 7) 0.4 % vaginal cream, Place 1 applicator vaginally at bedtime. IN MORNING, Disp: 45 g, Rfl: 0 Social History: Reviewed -  reports that she has quit smoking. Her smoking use included cigarettes. She quit after 4.00 years of use. She has never used smokeless tobacco.   Physical Assessment:   Vitals:   07/12/18 1230  BP: 120/77  Pulse: 97  Weight: 187 lb (84.8 kg)  Body mass index is 37.77 kg/m.           Physical Examination:   General appearance: alert, well appearing, and in no distress  Mental status: alert, oriented to person, place, and time  Skin: warm & dry   Extremities: Edema: None    Cardiovascular: normal heart rate noted  Respiratory: normal respiratory effort, no distress  Abdomen: gravid, soft, non-tender  Pelvic: Cervical exam deferred thick white dc, red vagina        Fetal Status:     Movement: Present    Fetal Surveillance Testing today: [place] 25 wks,breech,anterior placenta gr 2,short cx w/funneling 2.4-1.57 cm w/and w/o pressure,svp of fluid 6.2 cm, retroplacental anterior fibroid  n/c 7.6 x 5.8 x 4.4 cm,fhr 152 bpm  Results for orders placed or performed in visit on 07/12/18 (from the past 24 hour(s))  POC Urinalysis Dipstick OB   Collection Time: 07/12/18 12:08 PM  Result Value Ref Range   Color, UA     Clarity, UA     Glucose, UA Negative Negative   Bilirubin, UA     Ketones, UA neg    Spec Grav, UA     Blood, UA neg    pH, UA     POC,PROTEIN,UA Negative Negative, Trace, Small (1+), Moderate (2+), Large (3+), 4+   Urobilinogen, UA     Nitrite, UA neg    Leukocytes, UA Large (3+) (A) Negative   Appearance     Odor      Assessment & Plan:  1) High-risk pregnancy G2P1001 at [redacted]w[redacted]d with an Estimated Date of Delivery:  10/25/18   2) CHTN, stable.  Treatment Plan:  Continue Norvasc 10mg , ASA', growth [place] 28, 33, 36wks     2x/wk testing nst/sono @ 36wks or weekly BPP   Deliver @ 39  3) cervical shortening, stable.  Treatment Plan:  Continue prometrium 200mg  pv  4)  Hx IUGR:  EFW next visit  5_)   Yeast : rx terconazole qam   Follow-up: Return in about 20 days (around 08/01/2018) for HROB, US:EFW, cx lenght.  Future Appointments  Date Time Provider [place]  08/01/2018  9:00 AM [facility]    Orders Placed This Encounter  Procedures  . US OB Follow Up  . US OB Transvaginal  . POC Urinalysis Dipstick OB   Christin Fudge [facility] 07/12/2018 12:53 PM

## 2018-07-12 NOTE — Patient Instructions (Addendum)

## 2018-07-27 NOTE — L&D Delivery Note (Addendum)
Patient: Tara Stark MRN: [id number]  GBS status: Positive, IAP given   Patient is a 38 y.o. now G2P2 s/p NSVD at [redacted]w[redacted]d, who was admitted for IOL for cHTN/SIPE. AROM 8h 44m prior to delivery with clear fluid.   Delivery Note At 7:02 PM a viable and healthy female was delivered via Vaginal, Spontaneous (Presentation: Vertex; ROA).  APGAR: 2, pending; weight  pending.   Placenta status: spontaneous, intact.  3 vessel cord. One nuchal cord. Cord pH: 7.14  Anesthesia:  Epidural Episiotomy: None Lacerations: 1st degree;Perineal Suture Repair: None Est. Blood Loss (mL):  0  Head delivered ROA. One nuchal cord present and reduced at the perineum. Should dystocia identified. Attending physician called by delivery nurse.  McRoberts and wood screw maneuvers executed with successful delivery of shoulder and body. The cord was immediately clamped and cut and the baby was taken to the warmer. Cord gas collected for Baby B: pH 7.14.  Placenta delivered spontaneously with gentle cord traction. Fundus firm with massage and Pitocin. Perineum inspected and found to have no laceration.  Mom to postpartum.  Baby to Couplet care / Skin to Skin.  [place] 10/04/2018, 7:30 PM  OB FELLOW DELIVERY ATTESTATION  I was gloved and present for the delivery in its entirety, and I agree with the above resident's note.    Aura Camps, MD  OB Fellow  10/04/2018, 8:36 PM

## 2018-08-01 ENCOUNTER — Ambulatory Visit (INDEPENDENT_AMBULATORY_CARE_PROVIDER_SITE_OTHER): Payer: Medicaid Other | Admitting: Obstetrics and Gynecology

## 2018-08-01 ENCOUNTER — Encounter: Payer: Self-pay | Admitting: Obstetrics and Gynecology

## 2018-08-01 ENCOUNTER — Other Ambulatory Visit: Payer: Medicaid Other

## 2018-08-01 ENCOUNTER — Ambulatory Visit (INDEPENDENT_AMBULATORY_CARE_PROVIDER_SITE_OTHER): Payer: Medicaid Other

## 2018-08-01 VITALS — BP 131/82 | HR 96 | Wt 186.6 lb

## 2018-08-01 DIAGNOSIS — O0992 Supervision of high risk pregnancy, unspecified, second trimester: Secondary | ICD-10-CM

## 2018-08-01 DIAGNOSIS — O0990 Supervision of high risk pregnancy, unspecified, unspecified trimester: Secondary | ICD-10-CM

## 2018-08-01 DIAGNOSIS — O26892 Other specified pregnancy related conditions, second trimester: Secondary | ICD-10-CM | POA: Diagnosis not present

## 2018-08-01 DIAGNOSIS — Z3A27 27 weeks gestation of pregnancy: Secondary | ICD-10-CM

## 2018-08-01 DIAGNOSIS — N898 Other specified noninflammatory disorders of vagina: Secondary | ICD-10-CM

## 2018-08-01 DIAGNOSIS — Z8759 Personal history of other complications of pregnancy, childbirth and the puerperium: Secondary | ICD-10-CM

## 2018-08-01 DIAGNOSIS — O09212 Supervision of pregnancy with history of pre-term labor, second trimester: Secondary | ICD-10-CM | POA: Diagnosis not present

## 2018-08-01 DIAGNOSIS — Z9889 Other specified postprocedural states: Secondary | ICD-10-CM

## 2018-08-01 DIAGNOSIS — O10912 Unspecified pre-existing hypertension complicating pregnancy, second trimester: Secondary | ICD-10-CM

## 2018-08-01 DIAGNOSIS — Z331 Pregnant state, incidental: Secondary | ICD-10-CM

## 2018-08-01 DIAGNOSIS — Z1389 Encounter for screening for other disorder: Secondary | ICD-10-CM

## 2018-08-01 LAB — POCT URINALYSIS DIPSTICK OB
Blood, UA: NEGATIVE
Nitrite, UA: NEGATIVE
POC,PROTEIN,UA: NEGATIVE

## 2018-08-01 LAB — POCT WET PREP WITH KOH
KOH Prep POC: NEGATIVE
Trichomonas, UA: NEGATIVE
YEAST WET PREP PER HPF POC: NEGATIVE

## 2018-08-01 MED ORDER — BETAMETHASONE SOD PHOS & ACET 6 (3-3) MG/ML IJ SUSP
12.0000 mg | Freq: Once | INTRAMUSCULAR | Status: AC
  Administered 2018-08-01: 12 mg via INTRAMUSCULAR

## 2018-08-01 NOTE — Progress Notes (Signed)
[place] 27+6 wks,breech,U shaped funneling with bulging amniotic membranes,cervical opening 4.4 mm,cord visualized w/in the cervical canal w/sludge,anterior placenta gr 0,anterior retroplacental fibroid 6 x 4.3 x 7.1 cm,bilat adnexa's wnl,afi 21 cm,fhr 140 bpm,efw 1145 g 39%

## 2018-08-01 NOTE — Progress Notes (Addendum)
Patient ID: Tara Stark, female   DOB: [birth year], 38 y.o.   MRN: [id number]    [facility] PREGNANCY VISIT Patient name: Tara Stark MRN [id number]  Date of birth: Dec 11, 1980 Chief Complaint:   High Risk Gestation (PN2 and [place] today; Betamethasone 12mg  )  History of Present Illness:   Tara Stark is a 38 y.o. G37P1001 female at [redacted]w[redacted]d with an Estimated Date of Delivery: 10/25/18 being seen today for ongoing management of a high-risk pregnancy complicated by chronic HTN, and hx of cervical cerclage.  Today she reports vaginal irritation and with itching. Is taking terazol for yeast infection. Still having itching on outside of vagina. She uses it both by mouth and cream. Contractions: Not present. Vag. Bleeding: None.  Movement: Present. denies leaking of fluid.  Review of Systems:   Pertinent items are noted in HPI Denies abnormal vaginal discharge w/ itching/odor/irritation, headaches, visual changes, shortness of breath, chest pain, abdominal pain, severe nausea/vomiting, or problems with urination or bowel movements unless otherwise stated above. Pertinent History Reviewed:  Reviewed past medical,surgical, social, obstetrical and family history.  Reviewed problem list, medications and allergies. Physical Assessment:   Vitals:   08/01/18 1133  BP: 131/82  Pulse: 96  Weight: 186 lb 9.6 oz (84.6 kg)  Body mass index is 37.69 kg/m.           Physical Examination:   General appearance: alert, well appearing, and in no distress and oriented to person, place, and time  Mental status: alert, oriented to person, place, and time, normal mood, behavior, speech, dress, motor activity, and thought processes, affect appropriate to mood  Skin: warm & dry   Extremities: Edema: None    Cardiovascular: normal heart rate noted  Respiratory: normal respiratory effort, no distress  Abdomen: gravid, soft, non-tender  Pelvic: Cervical exam performed , visibly still closed 3-5 mm, baby breech,  clumpy white discharge consistent with terazol  KOH: neg yeast, clumps consistent with medication  GBS collected        Fetal Status:     Movement: Present    Fetal Surveillance Testing today: [place] 27+6 wks,breech,U shaped funneling with bulging amniotic membranes,cervical opening 4.4 mm,cord visualized w/in the cervical canal w/sludge,anterior placenta gr 0,anterior retroplacental fibroid 6 x 4.3 x 7.1 cm,bilat adnexa's wnl,afi 21 cm,fhr 140 bpm,efw 1145 g 39%   Results for orders placed or performed in visit on 08/01/18 (from the past 24 hour(s))  POC Urinalysis Dipstick OB   Collection Time: 08/01/18 11:35 AM  Result Value Ref Range   Color, UA     Clarity, UA     Glucose, UA Small (1+) (A) Negative   Bilirubin, UA     Ketones, UA 2+    Spec Grav, UA     Blood, UA neg    pH, UA     POC,PROTEIN,UA Negative Negative, Trace, Small (1+), Moderate (2+), Large (3+), 4+   Urobilinogen, UA     Nitrite, UA neg    Leukocytes, UA Trace (A) Negative   Appearance     Odor      Assessment & Plan:  1) High-risk pregnancy G2P1001 at [redacted]w[redacted]d with an Estimated Date of Delivery: 10/25/18   2) hx of cervical cerclage, unstable, US showed cervical funneling with external os open to 0.4 cm on TV u/s will take out of work, treat with Betamethasone 12 mg IM q 24 x 2. 1st shot given today  3) CHTN,  stable, 10 mg + baby asparin  4 s/p recent yeast infection, still itching though - KOH eval.  5) GBS collected  Meds:  Meds ordered this encounter  Medications  . betamethasone acetate-betamethasone sodium phosphate (CELESTONE) injection 12 mg    Labs/procedures today: U/S  Treatment Plan:   1. Betamethasone injections, final injection 08/02/2018 2. Continue Rx Terazol 3. Pessary discussion Discussed with Dr Tama High, no pessary advised. 4. F/u 08/02/18 5. Medication management, Rx Diflucan refill   Reviewed: Preterm labor symptoms and general obstetric precautions including but not limited  to vaginal bleeding, contractions, leaking of fluid and fetal movement were reviewed in detail with the patient.  All questions were answered.  Follow-up: No follow-ups on file.  Orders Placed This Encounter  Procedures  . POC Urinalysis Dipstick OB   By signing my name below, I, Samul Dada, attest that this documentation has been prepared under the direction and in the presence of Jonnie Kind, MD. Electronically Signed: [place]. 08/01/18. 11:58 AM.  I personally performed the services described in this documentation, which was SCRIBED in my presence. The recorded information has been reviewed and considered accurate. It has been edited as necessary during review. Jonnie Kind, MD

## 2018-08-02 ENCOUNTER — Ambulatory Visit (INDEPENDENT_AMBULATORY_CARE_PROVIDER_SITE_OTHER): Payer: Medicaid Other | Admitting: Obstetrics & Gynecology

## 2018-08-02 ENCOUNTER — Encounter: Payer: Self-pay | Admitting: Obstetrics & Gynecology

## 2018-08-02 VITALS — BP 115/69 | HR 121 | Wt 187.5 lb

## 2018-08-02 DIAGNOSIS — Z3A28 28 weeks gestation of pregnancy: Secondary | ICD-10-CM

## 2018-08-02 DIAGNOSIS — O0990 Supervision of high risk pregnancy, unspecified, unspecified trimester: Secondary | ICD-10-CM

## 2018-08-02 DIAGNOSIS — O09213 Supervision of pregnancy with history of pre-term labor, third trimester: Secondary | ICD-10-CM | POA: Diagnosis not present

## 2018-08-02 DIAGNOSIS — N883 Incompetence of cervix uteri: Secondary | ICD-10-CM

## 2018-08-02 DIAGNOSIS — O0993 Supervision of high risk pregnancy, unspecified, third trimester: Secondary | ICD-10-CM

## 2018-08-02 DIAGNOSIS — Z1389 Encounter for screening for other disorder: Secondary | ICD-10-CM

## 2018-08-02 DIAGNOSIS — Z331 Pregnant state, incidental: Secondary | ICD-10-CM

## 2018-08-02 LAB — CBC
Hematocrit: 35.9 % (ref 34.0–46.6)
Hemoglobin: 12.5 g/dL (ref 11.1–15.9)
MCH: 29.2 pg (ref 26.6–33.0)
MCHC: 34.8 g/dL (ref 31.5–35.7)
MCV: 84 fL (ref 79–97)
Platelets: 187 10*3/uL (ref 150–450)
RBC: 4.28 x10E6/uL (ref 3.77–5.28)
RDW: 13.8 % (ref 11.7–15.4)
WBC: 9.8 10*3/uL (ref 3.4–10.8)

## 2018-08-02 LAB — POCT URINALYSIS DIPSTICK OB
Blood, UA: NEGATIVE
Ketones, UA: NEGATIVE
Nitrite, UA: NEGATIVE

## 2018-08-02 LAB — ANTIBODY SCREEN: Antibody Screen: NEGATIVE

## 2018-08-02 LAB — GLUCOSE TOLERANCE, 2 HOURS W/ 1HR
Glucose, 1 hour: 162 mg/dL (ref 65–179)
Glucose, 2 hour: 128 mg/dL (ref 65–152)
Glucose, Fasting: 78 mg/dL (ref 65–91)

## 2018-08-02 LAB — RPR: RPR Ser Ql: NONREACTIVE

## 2018-08-02 LAB — HIV ANTIBODY (ROUTINE TESTING W REFLEX): HIV Screen 4th Generation wRfx: NONREACTIVE

## 2018-08-02 MED ORDER — BETAMETHASONE SOD PHOS & ACET 6 (3-3) MG/ML IJ SUSP
12.0000 mg | Freq: Once | INTRAMUSCULAR | Status: AC
  Administered 2018-08-02: 12 mg via INTRAMUSCULAR

## 2018-08-02 NOTE — Progress Notes (Signed)
Patient ID: [name], female   DOB: 10-28-1980, 38 y.o.   MRN: [id number]   [facility] PREGNANCY VISIT Patient name: Tara Stark MRN [id number]  Date of birth: 24-Nov-1980 Chief Complaint:   High Risk Gestation (Betamethasone 12mg )  History of Present Illness:   Tara Stark is a 38 y.o. G30P1001 female at [redacted]w[redacted]d with an Estimated Date of Delivery: 10/25/18 being seen today for ongoing management of a high-risk pregnancy complicated by chronic HTN, short cervix.  Today she reports no complaints. Contractions: Not present. Vag. Bleeding: None.  Movement: Present. denies leaking of fluid.  Review of Systems:   Pertinent items are noted in HPI Denies abnormal vaginal discharge w/ itching/odor/irritation, headaches, visual changes, shortness of breath, chest pain, abdominal pain, severe nausea/vomiting, or problems with urination or bowel movements unless otherwise stated above. Pertinent History Reviewed:  Reviewed past medical,surgical, social, obstetrical and family history.  Reviewed problem list, medications and allergies. Physical Assessment:   Vitals:   08/02/18 0857  BP: 115/69  Pulse: (!) 121  Weight: 187 lb 8 oz (85 kg)  Body mass index is 37.87 kg/m.           Physical Examination:   General appearance: alert, well appearing, and in no distress  Mental status: alert, oriented to person, place, and time  Skin: warm & dry   Extremities: Edema: None    Cardiovascular: normal heart rate noted  Respiratory: normal respiratory effort, no distress  Abdomen: gravid, soft, non-tender  Pelvic: Cervical exam deferred         Fetal Status: Fetal Heart Rate (bpm): 170 Fundal Height: 30 cm Movement: Present    Fetal Surveillance Testing today: FHR 170   Results for orders placed or performed in visit on 08/02/18 (from the past 24 hour(s))  POC Urinalysis Dipstick OB   Collection Time: 08/02/18  8:58 AM  Result Value Ref Range   Color, UA     Clarity, UA     Glucose, UA  4+ (A) Negative   Bilirubin, UA     Ketones, UA neg    Spec Grav, UA     Blood, UA neg    pH, UA     POC,PROTEIN,UA Trace Negative, Trace, Small (1+), Moderate (2+), Large (3+), 4+   Urobilinogen, UA     Nitrite, UA neg    Leukocytes, UA Trace (A) Negative   Appearance     Odor    Results for orders placed or performed in visit on 08/01/18 (from the past 24 hour(s))  POC Urinalysis Dipstick OB   Collection Time: 08/01/18 11:35 AM  Result Value Ref Range   Color, UA     Clarity, UA     Glucose, UA Small (1+) (A) Negative   Bilirubin, UA     Ketones, UA 2+    Spec Grav, UA     Blood, UA neg    pH, UA     POC,PROTEIN,UA Negative Negative, Trace, Small (1+), Moderate (2+), Large (3+), 4+   Urobilinogen, UA     Nitrite, UA neg    Leukocytes, UA Trace (A) Negative   Appearance     Odor    POCT Wet Prep with KOH   Collection Time: 08/01/18 11:59 AM  Result Value Ref Range   Trichomonas, UA Negative    Clue Cells Wet Prep HPF POC     Epithelial Wet Prep HPF POC     Yeast Wet Prep HPF POC neg    Bacteria  Wet Prep HPF POC     RBC Wet Prep HPF POC     WBC Wet Prep HPF POC     KOH Prep POC Negative Negative  Results for orders placed or performed in visit on 08/01/18 (from the past 24 hour(s))  CBC   Collection Time: 08/01/18  9:22 AM  Result Value Ref Range   WBC 9.8 3.4 - 10.8 x10E3/uL   RBC 4.28 3.77 - 5.28 x10E6/uL   Hemoglobin 12.5 11.1 - 15.9 g/dL   Hematocrit 35.9 34.0 - 46.6 %   MCV 84 79 - 97 fL   MCH 29.2 26.6 - 33.0 pg   MCHC 34.8 31.5 - 35.7 g/dL   RDW 13.8 11.7 - 15.4 %   Platelets 187 150 - 450 x10E3/uL  Glucose Tolerance, 2 Hours w/1 Hour   Collection Time: 08/01/18  9:22 AM  Result Value Ref Range   Glucose, Fasting 78 65 - 91 mg/dL   Glucose, 1 hour 162 65 - 179 mg/dL   Glucose, 2 hour 128 65 - 152 mg/dL  HIV Antibody (routine testing w rflx)   Collection Time: 08/01/18  9:22 AM  Result Value Ref Range   HIV Screen 4th Generation wRfx Non Reactive  Non Reactive  RPR   Collection Time: 08/01/18  9:22 AM  Result Value Ref Range   RPR Ser Ql Non Reactive Non Reactive  Antibody screen   Collection Time: 08/01/18  9:22 AM  Result Value Ref Range   Antibody Screen Negative Negative    Assessment & Plan:  1) High-risk pregnancy G2P1001 at [redacted]w[redacted]d with an Estimated Date of Delivery: 10/25/18   2) Short cervix, ,on prometrium nightly, s/p BMZ x 2  3) CHTN, stable, on norvasc  Meds:  Meds ordered this encounter  Medications  . betamethasone acetate-betamethasone sodium phosphate (CELESTONE) injection 12 mg    Labs/procedures today:   Treatment Plan:  Continue norvasc and prometrium, no data to follow CL from her  Reviewed: pre Term labor symptoms and general obstetric precautions including but not limited to vaginal bleeding, contractions, leaking of fluid and fetal movement were reviewed in detail with the patient.  All questions were answered.  Follow-up: Return in about 2 weeks (around 08/16/2018) for cancel next appointment, , HROB.  Orders Placed This Encounter  Procedures  . POC Urinalysis Dipstick OB   Florian Buff  08/02/2018 9:21 AM

## 2018-08-02 NOTE — Progress Notes (Signed)
Pt received Betamethasone 12 mg in left deltoid. Pt tolerated shot well. [place]

## 2018-08-03 LAB — STREP GP B NAA: Strep Gp B NAA: POSITIVE — AB

## 2018-08-04 ENCOUNTER — Other Ambulatory Visit: Payer: Self-pay | Admitting: Advanced Practice Midwife

## 2018-08-09 ENCOUNTER — Encounter: Payer: Medicaid Other | Admitting: Obstetrics & Gynecology

## 2018-08-16 ENCOUNTER — Encounter: Payer: Self-pay | Admitting: Women's Health

## 2018-08-16 ENCOUNTER — Ambulatory Visit (INDEPENDENT_AMBULATORY_CARE_PROVIDER_SITE_OTHER): Payer: Medicaid Other | Admitting: Women's Health

## 2018-08-16 VITALS — BP 131/87 | HR 116 | Wt 187.0 lb

## 2018-08-16 DIAGNOSIS — Z331 Pregnant state, incidental: Secondary | ICD-10-CM

## 2018-08-16 DIAGNOSIS — O0993 Supervision of high risk pregnancy, unspecified, third trimester: Secondary | ICD-10-CM

## 2018-08-16 DIAGNOSIS — E039 Hypothyroidism, unspecified: Secondary | ICD-10-CM | POA: Diagnosis not present

## 2018-08-16 DIAGNOSIS — O3433 Maternal care for cervical incompetence, third trimester: Secondary | ICD-10-CM

## 2018-08-16 DIAGNOSIS — N883 Incompetence of cervix uteri: Secondary | ICD-10-CM

## 2018-08-16 DIAGNOSIS — Z8639 Personal history of other endocrine, nutritional and metabolic disease: Secondary | ICD-10-CM

## 2018-08-16 DIAGNOSIS — Z23 Encounter for immunization: Secondary | ICD-10-CM

## 2018-08-16 DIAGNOSIS — Z3A30 30 weeks gestation of pregnancy: Secondary | ICD-10-CM

## 2018-08-16 DIAGNOSIS — Z1389 Encounter for screening for other disorder: Secondary | ICD-10-CM

## 2018-08-16 DIAGNOSIS — O10919 Unspecified pre-existing hypertension complicating pregnancy, unspecified trimester: Secondary | ICD-10-CM

## 2018-08-16 DIAGNOSIS — O1213 Gestational proteinuria, third trimester: Secondary | ICD-10-CM | POA: Diagnosis not present

## 2018-08-16 DIAGNOSIS — O10913 Unspecified pre-existing hypertension complicating pregnancy, third trimester: Secondary | ICD-10-CM

## 2018-08-16 DIAGNOSIS — O0990 Supervision of high risk pregnancy, unspecified, unspecified trimester: Secondary | ICD-10-CM

## 2018-08-16 LAB — POCT URINALYSIS DIPSTICK OB: Nitrite, UA: NEGATIVE

## 2018-08-16 MED ORDER — MICONAZOLE NITRATE 2 % VA CREA: TOPICAL_CREAM | VAGINAL | 0 refills | Status: DC

## 2018-08-16 NOTE — Patient Instructions (Addendum)
Tara Stark, I greatly value your feedback.  If you receive a survey following your visit with [place] today, we appreciate you taking the time to fill it out.  Thanks, [name], CNM, WHNP-BC   Call the office [phone]) or go to [facility] if:  You begin to have strong, frequent contractions  Your water breaks.  Sometimes it is a big gush of fluid, sometimes it is just a trickle that keeps getting your panties wet or running down your legs  You have vaginal bleeding.  It is normal to have a small amount of spotting if your cervix was checked.   You don't feel your baby moving like normal.  If you don't, get you something to eat and drink and lay down and focus on feeling your baby move.  You should feel at least 10 movements in 2 hours.  If you don't, you should call the office or go to [facility].    Call the office [phone]) or go to [facility] for these signs of pre-eclampsia:  Severe headache that does not go away with Tylenol  Visual changes- seeing spots, double, blurred vision  Pain under your right breast or upper abdomen that does not go away with Tums or heartburn medicine  Nausea and/or vomiting  Severe swelling in your hands, feet, and face      Preterm Labor and Birth Information  The normal length of a pregnancy is 39-41 weeks. Preterm labor is when labor starts before 37 completed weeks of pregnancy. What are the risk factors for preterm labor? Preterm labor is more likely to occur in women who:  Have certain infections during pregnancy such as a bladder infection, sexually transmitted infection, or infection inside the uterus (chorioamnionitis).  Have a shorter-than-normal cervix.  Have gone into preterm labor before.  Have had surgery on their cervix.  Are younger than age 40 or older than age 38.  Are African American.  Are pregnant with twins or multiple babies (multiple gestation).  Take street drugs or smoke while  pregnant.  Do not gain enough weight while pregnant.  Became pregnant shortly after having been pregnant. What are the symptoms of preterm labor? Symptoms of preterm labor include:  Cramps similar to those that can happen during a menstrual period. The cramps may happen with diarrhea.  Pain in the abdomen or lower back.  Regular uterine contractions that may feel like tightening of the abdomen.  A feeling of increased pressure in the pelvis.  Increased watery or bloody mucus discharge from the vagina.  Water breaking (ruptured amniotic sac). Why is it important to recognize signs of preterm labor? It is important to recognize signs of preterm labor because babies who are born prematurely may not be fully developed. This can put them at an increased risk for:  Long-term (chronic) heart and lung problems.  Difficulty immediately after birth with regulating body systems, including blood sugar, body temperature, heart rate, and breathing rate.  Bleeding in the brain.  Cerebral palsy.  Learning difficulties.  Death. These risks are highest for babies who are born before 35 weeks of pregnancy. How is preterm labor treated? Treatment depends on the length of your pregnancy, your condition, and the health of your baby. It may involve:  Having a stitch (suture) placed in your cervix to prevent your cervix from opening too early (cerclage).  Taking or being given medicines, such as: ? Hormone medicines. These may be given early in pregnancy to help support the  pregnancy. ? Medicine to stop contractions. ? Medicines to help mature the baby's lungs. These may be prescribed if the risk of delivery is high. ? Medicines to prevent your baby from developing cerebral palsy. If the labor happens before 34 weeks of pregnancy, you may need to stay in the hospital. What should I do if I think I am in preterm labor? If you think that you are going into preterm labor, call your health care  provider right away. How can I prevent preterm labor in future pregnancies? To increase your chance of having a full-term pregnancy:  Do not use any tobacco products, such as cigarettes, chewing tobacco, and e-cigarettes. If you need help quitting, ask your health care provider.  Do not use street drugs or medicines that have not been prescribed to you during your pregnancy.  Talk with your health care provider before taking any herbal supplements, even if you have been taking them regularly.  Make sure you gain a healthy amount of weight during your pregnancy.  Watch for infection. If you think that you might have an infection, get it checked right away.  Make sure to tell your health care provider if you have gone into preterm labor before. This information is not intended to replace advice given to you by your health care provider. Make sure you discuss any questions you have with your health care provider. Document Released: 10/03/2003 Document Revised: 12/24/2015 Document Reviewed: 12/04/2015 Elsevier Interactive Patient Education  2019 [employer].

## 2018-08-16 NOTE — Progress Notes (Signed)
HIGH-RISK PREGNANCY VISIT Patient name: Tara Stark MRN [id number]  Date of birth: 1980-10-15 Chief Complaint:   Routine Prenatal Visit  History of Present Illness:   Tara Stark is a 38 y.o. G75P1001 female at [redacted]w[redacted]d with an Estimated Date of Delivery: 10/25/18 being seen today for ongoing management of a high-risk pregnancy complicated by [facility] on Norvasc 10mg  daily; h/o pre-e; no measurable cx w/ funneling, h/o cerclage prior pregnancy for same, s/p BMZ 1/6 & 1/7; h/o IUGR; retroplacental fibroid.  Today she reports feeling a lot of movement lower than she has been. Mucousy d/c on Sunday like mucous plug, none since. Still using terazol for yeast infection, doesn't seem to be working. Some mild headaches this past week, go away w/o meds. Denies visual changes, ruq/epigastric pain, n/v, etc. Took norvasc at 0900 this am. . 1+glucosuria- had pancakes w/ syrup this am, GTT was normal 2wks ago. Wants BTL.   Contractions: Not present. Vag. Bleeding: None.  Movement: Present. denies leaking of fluid.  Review of Systems:   Pertinent items are noted in HPI Denies abnormal vaginal discharge w/ itching/odor/irritation, headaches, visual changes, shortness of breath, chest pain, abdominal pain, severe nausea/vomiting, or problems with urination or bowel movements unless otherwise stated above. Pertinent History Reviewed:  Reviewed past medical,surgical, social, obstetrical and family history.  Reviewed problem list, medications and allergies. Physical Assessment:   Vitals:   08/16/18 1023  BP: 131/87  Pulse: (!) 116  Weight: 187 lb (84.8 kg)  Body mass index is 37.77 kg/m.           Physical Examination:   General appearance: alert, well appearing, and in no distress  Mental status: alert, oriented to person, place, and time  Skin: warm & dry   Extremities: Edema: None    Cardiovascular: normal heart rate noted  Respiratory: normal respiratory effort, no distress  Abdomen: gravid, soft,  non-tender  Pelvic: spec exam: large amt thick clumpy yellow d/c c/w yeast, cx visually closed- SVE deferred         Fetal Status: Fetal Heart Rate (bpm): 155 Fundal Height: 34 cm Movement: Present    Fetal Surveillance Testing today: doppler   Results for orders placed or performed in visit on 08/16/18 (from the past 24 hour(s))  POC Urinalysis Dipstick OB   Collection Time: 08/16/18 10:19 AM  Result Value Ref Range   Color, UA     Clarity, UA     Glucose, UA Small (1+) (A) Negative   Bilirubin, UA     Ketones, UA trace    Spec Grav, UA     Blood, UA moderate    pH, UA     POC,PROTEIN,UA Small (1+) Negative, Trace, Small (1+), Moderate (2+), Large (3+), 4+   Urobilinogen, UA     Nitrite, UA neg    Leukocytes, UA Moderate (2+) (A) Negative   Appearance     Odor      Assessment & Plan:  1) High-risk pregnancy G2P1001 at [redacted]w[redacted]d with an Estimated Date of Delivery: 10/25/18   2) CHTN, continue Norvasc 10mg  daily, ASA  3) H/O Pre-e> ASA, bp high normal today w/ occ mild headaches, will get pre-e labs. Reviewed pre-e s/s, reasons to seek care  4) No measurable cx w/ funneling> continue pelvic rest, out of work. Cx visually closed on spec exam today. Continue nightly prometrium. S/P BMZ 1/6 & 1/7. Reviewed ptl s/s, reasons to seek care  5) H/O IUGR> last EFW 39% at 27.6wks  6) Retroplacental fibroid> 6x4.3x7.1cm at 27.6wks  7) Vulvovaginal candida> stop terazol, rx miconazole 7  8) H/O hypothyroidism> not currently on meds, will check TSH for 3rd trimester today    Meds:  Meds ordered this encounter  Medications  . miconazole (MONISTAT 7) 2 % vaginal cream    Sig: 1 applicatorfull vaginally once daily x 7days    Dispense:  45 g    Refill:  0    Order Specific Question:   Supervising Provider    Answer:   Tania Ade H [[id number]]    Labs/procedures today: spec exam, tdap, labs  Treatment Plan:  Growth u/s @ 32, 36wks    2x/wk testing nst/sono @ 32wks or weekly BPP     Deliver 39wks  Reviewed: Preterm labor symptoms and general obstetric precautions including but not limited to vaginal bleeding, contractions, leaking of fluid and fetal movement were reviewed in detail with the patient.  All questions were answered.  Follow-up: Return in about 1 week (around 08/23/2018) for HROB, Sign BTL consent today.  Orders Placed This Encounter  Procedures  . Urine Culture  . Tdap vaccine greater than or equal to 7yo IM  . TSH  . CBC  . Comprehensive metabolic panel  . Protein / creatinine ratio, urine  . POC Urinalysis Dipstick OB   Roma Schanz CNM, [facility] 08/16/2018 11:15 AM

## 2018-08-17 LAB — PROTEIN / CREATININE RATIO, URINE
Creatinine, Urine: 146.1 mg/dL
Protein, Ur: 32.6 mg/dL
Protein/Creat Ratio: 223 mg/g creat — ABNORMAL HIGH (ref 0–200)

## 2018-08-17 LAB — COMPREHENSIVE METABOLIC PANEL
ALT: 12 IU/L (ref 0–32)
AST: 11 IU/L (ref 0–40)
Albumin/Globulin Ratio: 1.3 (ref 1.2–2.2)
Albumin: 3.7 g/dL — ABNORMAL LOW (ref 3.8–4.8)
Alkaline Phosphatase: 86 IU/L (ref 39–117)
BUN/Creatinine Ratio: 15 (ref 9–23)
BUN: 6 mg/dL (ref 6–20)
Bilirubin Total: 0.2 mg/dL (ref 0.0–1.2)
CALCIUM: 9.2 mg/dL (ref 8.7–10.2)
CO2: 19 mmol/L — AB (ref 20–29)
Chloride: 101 mmol/L (ref 96–106)
Creatinine, Ser: 0.4 mg/dL — ABNORMAL LOW (ref 0.57–1.00)
GFR calc Af Amer: 154 mL/min/{1.73_m2} (ref >59)
GFR calc non Af Amer: 133 mL/min/{1.73_m2} (ref >59)
GLUCOSE: 102 mg/dL — AB (ref 65–99)
Globulin, Total: 2.9 g/dL (ref 1.5–4.5)
Potassium: 3.9 mmol/L (ref 3.5–5.2)
Sodium: 136 mmol/L (ref 134–144)
Total Protein: 6.6 g/dL (ref 6.0–8.5)

## 2018-08-17 LAB — CBC
Hematocrit: 36.9 % (ref 34.0–46.6)
Hemoglobin: 13.1 g/dL (ref 11.1–15.9)
MCH: 29.2 pg (ref 26.6–33.0)
MCHC: 35.5 g/dL (ref 31.5–35.7)
MCV: 82 fL (ref 79–97)
Platelets: 215 10*3/uL (ref 150–450)
RBC: 4.48 x10E6/uL (ref 3.77–5.28)
RDW: 13.6 % (ref 11.7–15.4)
WBC: 12.6 10*3/uL — ABNORMAL HIGH (ref 3.4–10.8)

## 2018-08-17 LAB — TSH: TSH: 3.21 u[IU]/mL (ref 0.450–4.500)

## 2018-08-18 LAB — URINE CULTURE

## 2018-08-23 ENCOUNTER — Ambulatory Visit (INDEPENDENT_AMBULATORY_CARE_PROVIDER_SITE_OTHER): Payer: Medicaid Other | Admitting: Obstetrics & Gynecology

## 2018-08-23 ENCOUNTER — Encounter: Payer: Self-pay | Admitting: Obstetrics & Gynecology

## 2018-08-23 VITALS — BP 128/80 | HR 113 | Wt 189.5 lb

## 2018-08-23 DIAGNOSIS — Z3A31 31 weeks gestation of pregnancy: Secondary | ICD-10-CM

## 2018-08-23 DIAGNOSIS — Z331 Pregnant state, incidental: Secondary | ICD-10-CM

## 2018-08-23 DIAGNOSIS — N883 Incompetence of cervix uteri: Secondary | ICD-10-CM

## 2018-08-23 DIAGNOSIS — O0993 Supervision of high risk pregnancy, unspecified, third trimester: Secondary | ICD-10-CM

## 2018-08-23 DIAGNOSIS — O10919 Unspecified pre-existing hypertension complicating pregnancy, unspecified trimester: Secondary | ICD-10-CM

## 2018-08-23 DIAGNOSIS — Z1389 Encounter for screening for other disorder: Secondary | ICD-10-CM

## 2018-08-23 DIAGNOSIS — O3433 Maternal care for cervical incompetence, third trimester: Secondary | ICD-10-CM

## 2018-08-23 LAB — POCT URINALYSIS DIPSTICK OB
Blood, UA: NEGATIVE
Glucose, UA: NEGATIVE
Ketones, UA: NEGATIVE
Nitrite, UA: NEGATIVE
PROTEIN: NEGATIVE

## 2018-08-23 NOTE — Progress Notes (Signed)
   HIGH-RISK PREGNANCY VISIT Patient name: Tara Stark MRN [id number]  Date of birth: October 19, 1980 Chief Complaint:   High Risk Gestation  History of Present Illness:   Tara Stark is a 38 y.o. G48P1001 female at [redacted]w[redacted]d with an Estimated Date of Delivery: 10/25/18 being seen today for ongoing management of a high-risk pregnancy complicated by chronic HTN, short cervix.  Today she reports no complaints. Contractions: Not present. Vag. Bleeding: None.  Movement: Present. denies leaking of fluid.  Review of Systems:   Pertinent items are noted in HPI Denies abnormal vaginal discharge w/ itching/odor/irritation, headaches, visual changes, shortness of breath, chest pain, abdominal pain, severe nausea/vomiting, or problems with urination or bowel movements unless otherwise stated above. Pertinent History Reviewed:  Reviewed past medical,surgical, social, obstetrical and family history.  Reviewed problem list, medications and allergies. Physical Assessment:   Vitals:   08/23/18 1159  BP: 128/80  Pulse: (!) 113  Weight: 189 lb 8 oz (86 kg)  Body mass index is 38.27 kg/m.           Physical Examination:   General appearance: alert, well appearing, and in no distress  Mental status: alert, oriented to person, place, and time  Skin: warm & dry   Extremities: Edema: None    Cardiovascular: normal heart rate noted  Respiratory: normal respiratory effort, no distress  Abdomen: gravid, soft, non-tender  Pelvic: Cervical exam deferred         Fetal Status:     Movement: Present    Fetal Surveillance Testing today: FHR   Results for orders placed or performed in visit on 08/23/18 (from the past 24 hour(s))  POC Urinalysis Dipstick OB   Collection Time: 08/23/18 12:01 PM  Result Value Ref Range   Color, UA     Clarity, UA     Glucose, UA Negative Negative   Bilirubin, UA     Ketones, UA neg    Spec Grav, UA     Blood, UA neg    pH, UA     POC,PROTEIN,UA Negative Negative, Trace,  Small (1+), Moderate (2+), Large (3+), 4+   Urobilinogen, UA     Nitrite, UA neg    Leukocytes, UA Moderate (2+) (A) Negative   Appearance     Odor      Assessment & Plan:  1) High-risk pregnancy G2P1001 at [redacted]w[redacted]d with an Estimated Date of Delivery: 10/25/18   2) CHTN, stable on Norvasc 10 mg daily  3) Short Cervix on prometrium s/p betamethasone, stable  4) Retroplacental myoma 7 cm  Meds: No orders of the defined types were placed in this encounter.   Labs/procedures today:   Treatment Plan:  Begin twice weekly surveillance next week, sonogram alternating with NST, delivery at 39 weeks or as clinically indicated  Reviewed: Preterm labor symptoms and general obstetric precautions including but not limited to vaginal bleeding, contractions, leaking of fluid and fetal movement were reviewed in detail with the patient.  All questions were answered.  Follow-up: Return in about 1 week (around 08/30/2018) for BPP/sono, HROB.  Orders Placed This Encounter  Procedures  . US OB Follow Up  . US Fetal BPP W/O Non Stress  . [place] UA Cord Doppler  . POC Urinalysis Dipstick OB   Florian Buff  08/23/2018 12:13 PM

## 2018-08-30 ENCOUNTER — Other Ambulatory Visit: Payer: Self-pay

## 2018-08-30 ENCOUNTER — Ambulatory Visit (INDEPENDENT_AMBULATORY_CARE_PROVIDER_SITE_OTHER): Payer: Medicaid Other | Admitting: Obstetrics & Gynecology

## 2018-08-30 ENCOUNTER — Encounter: Payer: Self-pay | Admitting: Obstetrics & Gynecology

## 2018-08-30 ENCOUNTER — Ambulatory Visit (INDEPENDENT_AMBULATORY_CARE_PROVIDER_SITE_OTHER): Payer: Medicaid Other

## 2018-08-30 VITALS — BP 132/80 | HR 113 | Wt 190.0 lb

## 2018-08-30 DIAGNOSIS — O403XX0 Polyhydramnios, third trimester, not applicable or unspecified: Secondary | ICD-10-CM

## 2018-08-30 DIAGNOSIS — O10919 Unspecified pre-existing hypertension complicating pregnancy, unspecified trimester: Secondary | ICD-10-CM

## 2018-08-30 DIAGNOSIS — O0993 Supervision of high risk pregnancy, unspecified, third trimester: Secondary | ICD-10-CM

## 2018-08-30 DIAGNOSIS — O10913 Unspecified pre-existing hypertension complicating pregnancy, third trimester: Secondary | ICD-10-CM

## 2018-08-30 DIAGNOSIS — Z3A32 32 weeks gestation of pregnancy: Secondary | ICD-10-CM

## 2018-08-30 DIAGNOSIS — O3433 Maternal care for cervical incompetence, third trimester: Secondary | ICD-10-CM

## 2018-08-30 DIAGNOSIS — Z1389 Encounter for screening for other disorder: Secondary | ICD-10-CM

## 2018-08-30 DIAGNOSIS — O0990 Supervision of high risk pregnancy, unspecified, unspecified trimester: Secondary | ICD-10-CM

## 2018-08-30 DIAGNOSIS — N883 Incompetence of cervix uteri: Secondary | ICD-10-CM

## 2018-08-30 DIAGNOSIS — Z331 Pregnant state, incidental: Secondary | ICD-10-CM

## 2018-08-30 LAB — POCT URINALYSIS DIPSTICK OB
Blood, UA: NEGATIVE
GLUCOSE, UA: NEGATIVE
Ketones, UA: NEGATIVE
NITRITE UA: NEGATIVE
POC,PROTEIN,UA: NEGATIVE

## 2018-08-30 NOTE — Progress Notes (Signed)
   HIGH-RISK PREGNANCY VISIT Patient name: Tara Stark MRN [id number]  Date of birth: 12-Apr-1981 Chief Complaint:   High Risk Gestation (u/s )  History of Present Illness:   Tara Stark is a 38 y.o. G76P1001 female at [redacted]w[redacted]d with an Estimated Date of Delivery: 10/25/18 being seen today for ongoing management of a high-risk pregnancy complicated by chronic HTN.  Today she reports no complaints. Contractions: Irregular. Vag. Bleeding: None.  Movement: Present. denies leaking of fluid.  Review of Systems:   Pertinent items are noted in HPI Denies abnormal vaginal discharge w/ itching/odor/irritation, headaches, visual changes, shortness of breath, chest pain, abdominal pain, severe nausea/vomiting, or problems with urination or bowel movements unless otherwise stated above. Pertinent History Reviewed:  Reviewed past medical,surgical, social, obstetrical and family history.  Reviewed problem list, medications and allergies. Physical Assessment:   Vitals:   08/30/18 1204  BP: 132/80  Pulse: (!) 113  Weight: 190 lb (86.2 kg)  Body mass index is 38.38 kg/m.           Physical Examination:   General appearance: alert, well appearing, and in no distress  Mental status: alert, oriented to person, place, and time  Skin: warm & dry   Extremities: Edema: None    Cardiovascular: normal heart rate noted  Respiratory: normal respiratory effort, no distress  Abdomen: gravid, soft, non-tender  Pelvic: Cervical exam deferred         Fetal Status:     Movement: Present    Fetal Surveillance Testing today: BPP 8/8, normal Dopplers   Results for orders placed or performed in visit on 08/30/18 (from the past 24 hour(s))  POC Urinalysis Dipstick OB   Collection Time: 08/30/18 12:04 PM  Result Value Ref Range   Color, UA     Clarity, UA     Glucose, UA Negative Negative   Bilirubin, UA     Ketones, UA neg    Spec Grav, UA     Blood, UA neg    pH, UA     POC,PROTEIN,UA Negative Negative,  Trace, Small (1+), Moderate (2+), Large (3+), 4+   Urobilinogen, UA     Nitrite, UA neg    Leukocytes, UA Moderate (2+) (A) Negative   Appearance     Odor      Assessment & Plan:  1) High-risk pregnancy G2P1001 at [redacted]w[redacted]d with an Estimated Date of Delivery: 10/25/18   2) CHTN, stable  3) Short cervix on prometrium,   4) Polyhydramnios, idiopathic, getting weekly scans anyway  Meds: No orders of the defined types were placed in this encounter.   Labs/procedures today:   Treatment Plan:  Twice weekly surveillance  Reviewed: Preterm labor symptoms and general obstetric precautions including but not limited to vaginal bleeding, contractions, leaking of fluid and fetal movement were reviewed in detail with the patient.  All questions were answered.  Follow-up: Return in about 3 days (around 09/02/2018) for NST, HROB.  Orders Placed This Encounter  Procedures  . POC Urinalysis Dipstick OB   Lora Chavers H Norah Fick  08/30/2018 12:12 PM

## 2018-08-30 NOTE — Progress Notes (Signed)
[place] 32 wks,cephalic,fhr 106 bpm,anterior fundal placenta gr 2,anterior retroplacental fibroid 7.9 x 6.6 x 4.4 cm,AFI 26 cm,polyhydramnios,RI .66,.70,.63,.77,.66=81%,EFW 2325 g 92%,AC 99%,BPP 8/8

## 2018-09-02 ENCOUNTER — Ambulatory Visit (INDEPENDENT_AMBULATORY_CARE_PROVIDER_SITE_OTHER): Payer: Medicaid Other | Admitting: Obstetrics & Gynecology

## 2018-09-02 VITALS — BP 131/84 | HR 112 | Wt 190.0 lb

## 2018-09-02 DIAGNOSIS — Z3A32 32 weeks gestation of pregnancy: Secondary | ICD-10-CM | POA: Diagnosis not present

## 2018-09-02 DIAGNOSIS — O0993 Supervision of high risk pregnancy, unspecified, third trimester: Secondary | ICD-10-CM | POA: Diagnosis not present

## 2018-09-02 DIAGNOSIS — O10913 Unspecified pre-existing hypertension complicating pregnancy, third trimester: Secondary | ICD-10-CM | POA: Diagnosis not present

## 2018-09-02 DIAGNOSIS — O10919 Unspecified pre-existing hypertension complicating pregnancy, unspecified trimester: Secondary | ICD-10-CM

## 2018-09-02 DIAGNOSIS — O0990 Supervision of high risk pregnancy, unspecified, unspecified trimester: Secondary | ICD-10-CM

## 2018-09-02 DIAGNOSIS — N883 Incompetence of cervix uteri: Secondary | ICD-10-CM

## 2018-09-02 DIAGNOSIS — O3433 Maternal care for cervical incompetence, third trimester: Secondary | ICD-10-CM

## 2018-09-02 NOTE — Progress Notes (Signed)
   HIGH-RISK PREGNANCY VISIT Patient name: Tara Stark MRN [id number]  Date of birth: 07-04-1981 Chief Complaint:   Routine Prenatal Visit (NST)  History of Present Illness:   Tara Stark is a 38 y.o. G72P1001 female at [redacted]w[redacted]d with an Estimated Date of Delivery: 10/25/18 being seen today for ongoing management of a high-risk pregnancy complicated by chronic HTN.  Today she reports no complaints. Contractions: Not present. Vag. Bleeding: None.  Movement: Present. denies leaking of fluid.  Review of Systems:   Pertinent items are noted in HPI Denies abnormal vaginal discharge w/ itching/odor/irritation, headaches, visual changes, shortness of breath, chest pain, abdominal pain, severe nausea/vomiting, or problems with urination or bowel movements unless otherwise stated above. Pertinent History Reviewed:  Reviewed past medical,surgical, social, obstetrical and family history.  Reviewed problem list, medications and allergies. Physical Assessment:   Vitals:   09/02/18 0919  BP: 131/84  Pulse: (!) 112  Weight: 190 lb (86.2 kg)  Body mass index is 38.38 kg/m.           Physical Examination:   General appearance: alert, well appearing, and in no distress  Mental status: alert, oriented to person, place, and time  Skin: warm & dry   Extremities: Edema: None    Cardiovascular: normal heart rate noted  Respiratory: normal respiratory effort, no distress  Abdomen: gravid, soft, non-tender  Pelvic: Cervical exam deferred         Fetal Status:     Movement: Present    Fetal Surveillance Testing today: Reactive NST   No results found for this or any previous visit (from the past 24 hour(s)).  Assessment & Plan:  1) High-risk pregnancy G2P1001 at [redacted]w[redacted]d with an Estimated Date of Delivery: 10/25/18   2) CHTN, stable, norvasc 10 mg daily  3) short cervix on prometrium,   Meds: No orders of the defined types were placed in this encounter.   Labs/procedures today: NST  Treatment  Plan:  Twice weekly surveillance IOL 39 weeks or as indicated  Reviewed: Preterm labor symptoms and general obstetric precautions including but not limited to vaginal bleeding, contractions, leaking of fluid and fetal movement were reviewed in detail with the patient.  All questions were answered.  Follow-up: Return in about 4 days (around 09/06/2018) for BPP/sono, HROB.  No orders of the defined types were placed in this encounter.  Florian Buff  09/02/2018 9:50 AM

## 2018-09-05 ENCOUNTER — Other Ambulatory Visit: Payer: Self-pay | Admitting: Obstetrics & Gynecology

## 2018-09-05 DIAGNOSIS — O10919 Unspecified pre-existing hypertension complicating pregnancy, unspecified trimester: Secondary | ICD-10-CM

## 2018-09-06 ENCOUNTER — Ambulatory Visit (INDEPENDENT_AMBULATORY_CARE_PROVIDER_SITE_OTHER): Payer: Medicaid Other

## 2018-09-06 DIAGNOSIS — O0990 Supervision of high risk pregnancy, unspecified, unspecified trimester: Secondary | ICD-10-CM

## 2018-09-06 DIAGNOSIS — O10919 Unspecified pre-existing hypertension complicating pregnancy, unspecified trimester: Secondary | ICD-10-CM | POA: Diagnosis not present

## 2018-09-06 NOTE — Progress Notes (Signed)
[place] 33 wks,transverse head right,anterior placenta gr 2,afi 22 cm,fhr 142 bpm,BPP 8/8,RI .63,.70,.60=71%

## 2018-09-09 ENCOUNTER — Other Ambulatory Visit: Payer: Medicaid Other | Admitting: Advanced Practice Midwife

## 2018-09-13 ENCOUNTER — Ambulatory Visit (INDEPENDENT_AMBULATORY_CARE_PROVIDER_SITE_OTHER): Payer: Medicaid Other | Admitting: Advanced Practice Midwife

## 2018-09-13 ENCOUNTER — Encounter: Payer: Self-pay | Admitting: Advanced Practice Midwife

## 2018-09-13 ENCOUNTER — Other Ambulatory Visit: Payer: Self-pay

## 2018-09-13 VITALS — BP 143/84 | HR 107 | Wt 193.0 lb

## 2018-09-13 DIAGNOSIS — O0993 Supervision of high risk pregnancy, unspecified, third trimester: Secondary | ICD-10-CM

## 2018-09-13 DIAGNOSIS — O10913 Unspecified pre-existing hypertension complicating pregnancy, third trimester: Secondary | ICD-10-CM

## 2018-09-13 DIAGNOSIS — Z331 Pregnant state, incidental: Secondary | ICD-10-CM

## 2018-09-13 DIAGNOSIS — Z3A34 34 weeks gestation of pregnancy: Secondary | ICD-10-CM

## 2018-09-13 DIAGNOSIS — E039 Hypothyroidism, unspecified: Secondary | ICD-10-CM

## 2018-09-13 DIAGNOSIS — Z8759 Personal history of other complications of pregnancy, childbirth and the puerperium: Secondary | ICD-10-CM

## 2018-09-13 DIAGNOSIS — Z9889 Other specified postprocedural states: Secondary | ICD-10-CM

## 2018-09-13 DIAGNOSIS — Z1389 Encounter for screening for other disorder: Secondary | ICD-10-CM

## 2018-09-13 DIAGNOSIS — O10919 Unspecified pre-existing hypertension complicating pregnancy, unspecified trimester: Secondary | ICD-10-CM

## 2018-09-13 LAB — POCT URINALYSIS DIPSTICK OB
Blood, UA: NEGATIVE
Glucose, UA: NEGATIVE
KETONES UA: NEGATIVE
Leukocytes, UA: NEGATIVE
Nitrite, UA: NEGATIVE
POC,PROTEIN,UA: NEGATIVE

## 2018-09-13 NOTE — Progress Notes (Signed)
HIGH-RISK PREGNANCY VISIT Patient name: Tara Stark MRN [id number]  Date of birth: 1981/06/30 Chief Complaint:   High Risk Gestation (NST)  History of Present Illness:   Tara Stark is a 38 y.o. G30P1001 female at [redacted]w[redacted]d with an Estimated Date of Delivery: 10/25/18 being seen today for ongoing management of a high-risk pregnancy complicated by chronic HTN.  Today she reports no complaints. Contractions: Not present. Vag. Bleeding: None.  Movement: Present. denies leaking of fluid.  Review of Systems:   Pertinent items are noted in HPI Denies abnormal vaginal discharge w/ itching/odor/irritation, headaches, visual changes, shortness of breath, chest pain, abdominal pain, severe nausea/vomiting, or problems with urination or bowel movements unless otherwise stated above.    Pertinent History Reviewed:  Medical & Surgical Hx:   Past Medical History:  Diagnosis Date  . Acid indigestion   . Hemorrhoids   . Hypertension   . Thyroid disease    Past Surgical History:  Procedure Laterality Date  . NO PAST SURGERIES     Family History  Problem Relation Age of Onset  . Other Paternal Grandfather        MVA  . Breast cancer Paternal Grandmother   . Alzheimer's disease Maternal Grandmother   . Cancer Maternal Grandfather        throat  . Diabetes Father   . Other Father        has a pacemaker  . Hypertension Mother   . Hypertension Brother   . Hypertension Sister   . Hypertension Sister   . Bronchiolitis Son   . Hypertension Paternal Aunt     Current Outpatient Medications:  .  acetaminophen (TYLENOL) 500 MG tablet, Take 1,000 mg by mouth as needed., Disp: , Rfl:  .  amLODipine (NORVASC) 10 MG tablet, Take 1 tablet (10 mg total) by mouth daily., Disp: 30 tablet, Rfl: 12 .  aspirin EC 81 MG tablet, Take 162 mg by mouth daily., Disp: , Rfl:  .  prenatal vitamin w/FE, FA (PRENATAL 1 + 1) 27-1 MG TABS tablet, Take 1 tablet by mouth daily at 12 noon., Disp: 30 each, Rfl: 12 .   progesterone (PROMETRIUM) 200 MG capsule, Place 1 capsule (200 mg total) vaginally at bedtime. Start @ 12wks of pregnancy (on 04/12/18), Disp: 30 capsule, Rfl: 5 Social History: Reviewed -  reports that she has quit smoking. Her smoking use included cigarettes. She quit after 4.00 years of use. She has never used smokeless tobacco.   Physical Assessment:   Vitals:   09/13/18 0952  BP: (!) 143/84  Pulse: (!) 107  Weight: 193 lb (87.5 kg)  Body mass index is 38.98 kg/m.           Physical Examination:   General appearance: alert, well appearing, and in no distress  Mental status: alert, oriented to person, place, and time  Skin: warm & dry   Extremities: Edema: None    Cardiovascular: normal heart rate noted  Respiratory: normal respiratory effort, no distress  Abdomen: gravid, soft, non-tender  Pelvic: Cervical exam deferred         Fetal Status:     Movement: Present    Fetal Surveillance Testing today: NST: FHR baseline 150 bpm, Variability: moderate, Accelerations:present, Decelerations:  Absent= Cat 1/Reactive   Results for orders placed or performed in visit on 09/13/18 (from the past 24 hour(s))  POC Urinalysis Dipstick OB   Collection Time: 09/13/18  9:53 AM  Result Value Ref Range   Color, UA  Clarity, UA     Glucose, UA Negative Negative   Bilirubin, UA     Ketones, UA neg    Spec Grav, UA     Blood, UA neg    pH, UA     POC,PROTEIN,UA Negative Negative, Trace, Small (1+), Moderate (2+), Large (3+), 4+   Urobilinogen, UA     Nitrite, UA neg    Leukocytes, UA Negative Negative   Appearance     Odor      Assessment & Plan:  1) High-risk pregnancy G2P1001 at [redacted]w[redacted]d with an Estimated Date of Delivery: 10/25/18   2) CHTN, stable.  Treatment Plan:  Twice weekly testing, IOL 39 weeks  3) short cx onprometrium, .  Treatment Plan:  Stop prometrium full term  4)  Polyhydramnios: resolved last [place] Gets weekly AFI w/bpp   Follow-up:  Future Appointments  Date  Time Provider [place]  09/16/2018 10:00 AM Roma Schanz, CNM [facility]  09/20/2018 11:45 AM [facility] [place] [facility] None  09/23/2018  9:00 AM Florian Buff, MD [facility]  09/27/2018  9:00 AM [facility] [place] [facility] None  09/27/2018  9:45 AM Roma Schanz, CNM [facility]  09/30/2018  9:00 AM Jonnie Kind, MD [facility]  10/04/2018  9:00 AM [facility] [place] [facility] None  10/04/2018  9:45 AM Roma Schanz, CNM [facility]  10/07/2018  9:00 AM Christin Fudge, CNM [facility]  10/11/2018  9:00 AM [facility] [place] [facility] None  10/11/2018  9:45 AM Cresenzo-Dishmon, Joaquim Lai, CNM [facility]  10/14/2018  9:00 AM Florian Buff, MD [facility]  10/18/2018  9:00 AM [facility] [place] [facility] None  10/18/2018  9:45 AM Florian Buff, MD [facility] Zetta Bills  10/21/2018  9:00 AM Florian Buff, MD [facility]    Orders Placed This Encounter  Procedures  . POC Urinalysis Dipstick OB   Christin Fudge CNM 09/13/2018 10:09 AM

## 2018-09-16 ENCOUNTER — Ambulatory Visit (INDEPENDENT_AMBULATORY_CARE_PROVIDER_SITE_OTHER): Payer: Medicaid Other | Admitting: Obstetrics & Gynecology

## 2018-09-16 ENCOUNTER — Other Ambulatory Visit: Payer: Self-pay

## 2018-09-16 ENCOUNTER — Encounter: Payer: Self-pay | Admitting: Obstetrics & Gynecology

## 2018-09-16 VITALS — BP 131/80 | HR 106 | Wt 194.0 lb

## 2018-09-16 DIAGNOSIS — O0993 Supervision of high risk pregnancy, unspecified, third trimester: Secondary | ICD-10-CM

## 2018-09-16 DIAGNOSIS — Z3A34 34 weeks gestation of pregnancy: Secondary | ICD-10-CM

## 2018-09-16 DIAGNOSIS — O10919 Unspecified pre-existing hypertension complicating pregnancy, unspecified trimester: Secondary | ICD-10-CM | POA: Diagnosis not present

## 2018-09-16 DIAGNOSIS — N883 Incompetence of cervix uteri: Secondary | ICD-10-CM

## 2018-09-16 DIAGNOSIS — Z1389 Encounter for screening for other disorder: Secondary | ICD-10-CM

## 2018-09-16 DIAGNOSIS — O3433 Maternal care for cervical incompetence, third trimester: Secondary | ICD-10-CM

## 2018-09-16 DIAGNOSIS — Z331 Pregnant state, incidental: Secondary | ICD-10-CM

## 2018-09-16 LAB — POCT URINALYSIS DIPSTICK OB
Blood, UA: NEGATIVE
Glucose, UA: NEGATIVE
Ketones, UA: NEGATIVE
Nitrite, UA: NEGATIVE

## 2018-09-16 NOTE — Progress Notes (Signed)
   HIGH-RISK PREGNANCY VISIT Patient name: Tara Stark MRN [id number]  Date of birth: 04/24/1981 Chief Complaint:   High Risk Gestation (NST)  History of Present Illness:   Tara Stark is a 38 y.o. G69P1001 female at [redacted]w[redacted]d with an Estimated Date of Delivery: 10/25/18 being seen today for ongoing management of a high-risk pregnancy complicated by chronic HTN, short cervix.  Today she reports no complaints. Contractions: Not present. Vag. Bleeding: None.  Movement: Present. denies leaking of fluid.  Review of Systems:   Pertinent items are noted in HPI Denies abnormal vaginal discharge w/ itching/odor/irritation, headaches, visual changes, shortness of breath, chest pain, abdominal pain, severe nausea/vomiting, or problems with urination or bowel movements unless otherwise stated above. Pertinent History Reviewed:  Reviewed past medical,surgical, social, obstetrical and family history.  Reviewed problem list, medications and allergies. Physical Assessment:   Vitals:   09/16/18 1015  BP: 131/80  Pulse: (!) 106  Weight: 194 lb (88 kg)  Body mass index is 39.18 kg/m.           Physical Examination:   General appearance: alert, well appearing, and in no distress  Mental status: alert, oriented to person, place, and time  Skin: warm & dry   Extremities: Edema: None    Cardiovascular: normal heart rate noted  Respiratory: normal respiratory effort, no distress  Abdomen: gravid, soft, non-tender  Pelvic: Cervical exam deferred         Fetal Status: Fetal Heart Rate (bpm): 145 Fundal Height: 38 cm Movement: Present    Fetal Surveillance Testing today: reactive NST   Results for orders placed or performed in visit on 09/16/18 (from the past 24 hour(s))  POC Urinalysis Dipstick OB   Collection Time: 09/16/18 10:14 AM  Result Value Ref Range   Color, UA     Clarity, UA     Glucose, UA Negative Negative   Bilirubin, UA     Ketones, UA neg    Spec Grav, UA     Blood, UA neg    pH, UA     POC,PROTEIN,UA Trace Negative, Trace, Small (1+), Moderate (2+), Large (3+), 4+   Urobilinogen, UA     Nitrite, UA neg    Leukocytes, UA Large (3+) (A) Negative   Appearance     Odor      Assessment & Plan:  1) High-risk pregnancy G2P1001 at [redacted]w[redacted]d with an Estimated Date of Delivery: 10/25/18   2) CHTN, stable, on Norvasc 10, baby ASA  3) Short cervix on cerclage, stable  Meds: No orders of the defined types were placed in this encounter.   Labs/procedures today: reactive NST  Treatment Plan:  Twice weekly surveillance, IOL 39 weeks or as clinically indicated  Reviewed: Preterm labor symptoms and general obstetric precautions including but not limited to vaginal bleeding, contractions, leaking of fluid and fetal movement were reviewed in detail with the patient.  All questions were answered.  Follow-up: Return in about 4 days (around 09/20/2018) for BPP/sono, HROB.  Orders Placed This Encounter  Procedures  . POC Urinalysis Dipstick OB   Florian Buff  09/16/2018 10:51 AM

## 2018-09-19 ENCOUNTER — Other Ambulatory Visit: Payer: Self-pay | Admitting: Obstetrics & Gynecology

## 2018-09-19 DIAGNOSIS — O10919 Unspecified pre-existing hypertension complicating pregnancy, unspecified trimester: Secondary | ICD-10-CM

## 2018-09-20 ENCOUNTER — Ambulatory Visit (INDEPENDENT_AMBULATORY_CARE_PROVIDER_SITE_OTHER): Payer: Medicaid Other

## 2018-09-20 DIAGNOSIS — O10919 Unspecified pre-existing hypertension complicating pregnancy, unspecified trimester: Secondary | ICD-10-CM | POA: Diagnosis not present

## 2018-09-20 DIAGNOSIS — O0990 Supervision of high risk pregnancy, unspecified, unspecified trimester: Secondary | ICD-10-CM

## 2018-09-20 NOTE — Progress Notes (Signed)
[place] 35 wks,cephalic,FHT 917 bpm,BPP 9/1,TAVWPVXY placenta gr 2,afi 17 cm,RI .56,.58,.61,.55=59%

## 2018-09-23 ENCOUNTER — Encounter (INDEPENDENT_AMBULATORY_CARE_PROVIDER_SITE_OTHER): Payer: Self-pay

## 2018-09-23 ENCOUNTER — Other Ambulatory Visit: Payer: Self-pay

## 2018-09-23 ENCOUNTER — Ambulatory Visit (INDEPENDENT_AMBULATORY_CARE_PROVIDER_SITE_OTHER): Payer: Medicaid Other | Admitting: Obstetrics & Gynecology

## 2018-09-23 ENCOUNTER — Encounter: Payer: Self-pay | Admitting: Obstetrics & Gynecology

## 2018-09-23 VITALS — BP 139/90 | HR 101 | Wt 195.0 lb

## 2018-09-23 DIAGNOSIS — Z3A35 35 weeks gestation of pregnancy: Secondary | ICD-10-CM

## 2018-09-23 DIAGNOSIS — O0993 Supervision of high risk pregnancy, unspecified, third trimester: Secondary | ICD-10-CM | POA: Diagnosis not present

## 2018-09-23 DIAGNOSIS — O3433 Maternal care for cervical incompetence, third trimester: Secondary | ICD-10-CM

## 2018-09-23 DIAGNOSIS — N883 Incompetence of cervix uteri: Secondary | ICD-10-CM

## 2018-09-23 DIAGNOSIS — Z1389 Encounter for screening for other disorder: Secondary | ICD-10-CM

## 2018-09-23 DIAGNOSIS — O10913 Unspecified pre-existing hypertension complicating pregnancy, third trimester: Secondary | ICD-10-CM

## 2018-09-23 DIAGNOSIS — Z331 Pregnant state, incidental: Secondary | ICD-10-CM

## 2018-09-23 DIAGNOSIS — O10919 Unspecified pre-existing hypertension complicating pregnancy, unspecified trimester: Secondary | ICD-10-CM

## 2018-09-23 LAB — POCT URINALYSIS DIPSTICK OB
Glucose, UA: NEGATIVE
Ketones, UA: NEGATIVE
NITRITE UA: NEGATIVE
POC,PROTEIN,UA: NEGATIVE
RBC UA: NEGATIVE

## 2018-09-23 MED ORDER — PROGESTERONE MICRONIZED 200 MG PO CAPS: 200.0000 mg | ORAL_CAPSULE | Freq: Every day | ORAL | 5 refills | Status: DC

## 2018-09-23 NOTE — Progress Notes (Signed)
   HIGH-RISK PREGNANCY VISIT Patient name: Tara Stark MRN [id number]  Date of birth: 06/27/1981 Chief Complaint:   High Risk Gestation (NST)  History of Present Illness:   Tara Stark is a 38 y.o. G63P1001 female at [redacted]w[redacted]d with an Estimated Date of Delivery: 10/25/18 being seen today for ongoing management of a high-risk pregnancy complicated by chronic HTN, short cervix.  Today she reports no complaints. Contractions: Irregular. Vag. Bleeding: None.  Movement: Present. denies leaking of fluid.  Review of Systems:   Pertinent items are noted in HPI Denies abnormal vaginal discharge w/ itching/odor/irritation, headaches, visual changes, shortness of breath, chest pain, abdominal pain, severe nausea/vomiting, or problems with urination or bowel movements unless otherwise stated above. Pertinent History Reviewed:  Reviewed past medical,surgical, social, obstetrical and family history.  Reviewed problem list, medications and allergies. Physical Assessment:   Vitals:   09/23/18 1009  BP: 139/90  Pulse: (!) 101  Weight: 195 lb (88.5 kg)  Body mass index is 39.39 kg/m.           Physical Examination:   General appearance: alert, well appearing, and in no distress  Mental status: alert, oriented to person, place, and time  Skin: warm & dry   Extremities: Edema: None    Cardiovascular: normal heart rate noted  Respiratory: normal respiratory effort, no distress  Abdomen: gravid, soft, non-tender  Pelvic: Cervical exam deferred         Fetal Status:     Movement: Present    Fetal Surveillance Testing today: reactive NST   Results for orders placed or performed in visit on 09/23/18 (from the past 24 hour(s))  POC Urinalysis Dipstick OB   Collection Time: 09/23/18 10:09 AM  Result Value Ref Range   Color, UA     Clarity, UA     Glucose, UA Negative Negative   Bilirubin, UA     Ketones, UA neg    Spec Grav, UA     Blood, UA neg    pH, UA     POC,PROTEIN,UA Negative  Negative, Trace, Small (1+), Moderate (2+), Large (3+), 4+   Urobilinogen, UA     Nitrite, UA neg    Leukocytes, UA Moderate (2+) (A) Negative   Appearance     Odor      Assessment & Plan:  1) High-risk pregnancy G2P1001 at [redacted]w[redacted]d with an Estimated Date of Delivery: 10/25/18   2) CHTN, stable on Norvasc 10 and baby ASA  3) short cervix on prometrium, stable  Meds:  Meds ordered this encounter  Medications  . progesterone (PROMETRIUM) 200 MG capsule    Sig: Place 1 capsule (200 mg total) vaginally at bedtime. Start @ 12wks of pregnancy (on 04/12/18)    Dispense:  30 capsule    Refill:  5    Labs/procedures today: NST rective  Treatment Plan:  Twice weekly surveillance, sonogram alternating with NST, induction at 39 weeks or as clinically indicated If baby stays transverse will discuss ECV  Reviewed: Preterm labor symptoms and general obstetric precautions including but not limited to vaginal bleeding, contractions, leaking of fluid and fetal movement were reviewed in detail with the patient.  All questions were answered.  Follow-up: Return in about 4 days (around 09/27/2018) for BPP/sono.  Orders Placed This Encounter  Procedures  . POC Urinalysis Dipstick OB   [name]   09/23/2018 11:03 AM

## 2018-09-26 ENCOUNTER — Other Ambulatory Visit: Payer: Self-pay | Admitting: Obstetrics & Gynecology

## 2018-09-26 DIAGNOSIS — O10919 Unspecified pre-existing hypertension complicating pregnancy, unspecified trimester: Secondary | ICD-10-CM

## 2018-09-27 ENCOUNTER — Ambulatory Visit (INDEPENDENT_AMBULATORY_CARE_PROVIDER_SITE_OTHER): Payer: Medicaid Other

## 2018-09-27 ENCOUNTER — Encounter: Payer: Self-pay | Admitting: Women's Health

## 2018-09-27 ENCOUNTER — Ambulatory Visit (INDEPENDENT_AMBULATORY_CARE_PROVIDER_SITE_OTHER): Payer: Medicaid Other | Admitting: Women's Health

## 2018-09-27 VITALS — BP 147/95 | HR 103 | Wt 195.4 lb

## 2018-09-27 DIAGNOSIS — Z3A36 36 weeks gestation of pregnancy: Secondary | ICD-10-CM | POA: Diagnosis not present

## 2018-09-27 DIAGNOSIS — O1213 Gestational proteinuria, third trimester: Secondary | ICD-10-CM

## 2018-09-27 DIAGNOSIS — O10913 Unspecified pre-existing hypertension complicating pregnancy, third trimester: Secondary | ICD-10-CM | POA: Diagnosis not present

## 2018-09-27 DIAGNOSIS — O0990 Supervision of high risk pregnancy, unspecified, unspecified trimester: Secondary | ICD-10-CM

## 2018-09-27 DIAGNOSIS — Z331 Pregnant state, incidental: Secondary | ICD-10-CM

## 2018-09-27 DIAGNOSIS — O10919 Unspecified pre-existing hypertension complicating pregnancy, unspecified trimester: Secondary | ICD-10-CM

## 2018-09-27 DIAGNOSIS — O0993 Supervision of high risk pregnancy, unspecified, third trimester: Secondary | ICD-10-CM

## 2018-09-27 DIAGNOSIS — Z1389 Encounter for screening for other disorder: Secondary | ICD-10-CM

## 2018-09-27 LAB — POCT URINALYSIS DIPSTICK OB
Glucose, UA: NEGATIVE
Nitrite, UA: NEGATIVE

## 2018-09-27 NOTE — Patient Instructions (Signed)
Johnnye Sima, I greatly value your feedback.  If you receive a survey following your visit with [place] today, we appreciate you taking the time to fill it out.  Thanks, [name], CNM, [facility]  [place]!!! It is now [place] at [facility] ([place], [place] [address]) Entrance located off of [address] parking     Call the office [phone]) or go to [facility] if:  You begin to have strong, frequent contractions  Your water breaks.  Sometimes it is a big gush of fluid, sometimes it is just a trickle that keeps getting your panties wet or running down your legs  You have vaginal bleeding.  It is normal to have a small amount of spotting if your cervix was checked.   You don't feel your baby moving like normal.  If you don't, get you something to eat and drink and lay down and focus on feeling your baby move.  You should feel at least 10 movements in 2 hours.  If you don't, you should call the office or go to [facility].   Call the office [phone]) or go to [facility] hospital for these signs of pre-eclampsia:  Severe headache that does not go away with Tylenol  Visual changes- seeing spots, double, blurred vision  Pain under your right breast or upper abdomen that does not go away with Tums or heartburn medicine  Nausea and/or vomiting  Severe swelling in your hands, feet, and face      Braxton Hicks Contractions Contractions of the uterus can occur throughout pregnancy, but they are not always a sign that you are in labor. You may have practice contractions called Braxton Hicks contractions. These false labor contractions are sometimes confused with true labor. What are [place] contractions? Braxton Hicks contractions are tightening movements that occur in the muscles of the uterus before labor. Unlike true labor contractions, these contractions do not result in opening (dilation) and thinning of  the cervix. Toward the end of pregnancy (32-34 weeks), Braxton Hicks contractions can happen more often and may become stronger. These contractions are sometimes difficult to tell apart from true labor because they can be very uncomfortable. You should not feel embarrassed if you go to the hospital with false labor. Sometimes, the only way to tell if you are in true labor is for your health care provider to look for changes in the cervix. The health care provider will do a physical exam and may monitor your contractions. If you are not in true labor, the exam should show that your cervix is not dilating and your water has not broken. If there are no other health problems associated with your pregnancy, it is completely safe for you to be sent home with false labor. You may continue to have Braxton Hicks contractions until you go into true labor. How to tell the difference between true labor and false labor True labor  Contractions last 30-70 seconds.  Contractions become very regular.  Discomfort is usually felt in the top of the uterus, and it spreads to the lower abdomen and low back.  Contractions do not go away with walking.  Contractions usually become more intense and increase in frequency.  The cervix dilates and gets thinner. False labor  Contractions are usually shorter and not as strong as true labor contractions.  Contractions are usually irregular.  Contractions are often felt in the front of the lower abdomen  and in the groin.  Contractions may go away when you walk around or change positions while lying down.  Contractions get weaker and are shorter-lasting as time goes on.  The cervix usually does not dilate or become thin. Follow these instructions at home:   Take over-the-counter and prescription medicines only as told by your health care provider.  Keep up with your usual exercises and follow other instructions from your health care provider.  Eat and drink  lightly if you think you are going into labor.  If Braxton Hicks contractions are making you uncomfortable: ? Change your position from lying down or resting to walking, or change from walking to resting. ? Sit and rest in a tub of warm water. ? Drink enough fluid to keep your urine pale yellow. Dehydration may cause these contractions. ? Do slow and deep breathing several times an hour.  Keep all follow-up prenatal visits as told by your health care provider. This is important. Contact a health care provider if:  You have a fever.  You have continuous pain in your abdomen. Get help right away if:  Your contractions become stronger, more regular, and closer together.  You have fluid leaking or gushing from your vagina.  You pass blood-tinged mucus (bloody show).  You have bleeding from your vagina.  You have low back pain that you never had before.  You feel your baby's head pushing down and causing pelvic pressure.  Your baby is not moving inside you as much as it used to. Summary  Contractions that occur before labor are called Braxton Hicks contractions, false labor, or practice contractions.  Braxton Hicks contractions are usually shorter, weaker, farther apart, and less regular than true labor contractions. True labor contractions usually become progressively stronger and regular, and they become more frequent.  Manage discomfort from [employer] contractions by changing position, resting in a warm bath, drinking plenty of water, or practicing deep breathing. This information is not intended to replace advice given to you by your health care provider. Make sure you discuss any questions you have with your health care provider. Document Released: 11/26/2016 Document Revised: 04/27/2017 Document Reviewed: 11/26/2016 Elsevier Interactive Patient Education  2019 [employer].

## 2018-09-27 NOTE — Progress Notes (Signed)
[place] 36 wks,cephalic,anterior placenta gr 3,fhr 140 bpm,BPP 8/8,anterior retroplacental fibroid 8 x 5.1 x 3.8 cm,afi 12.7 cm,RI .55,.52,.58,.61=47%,EFW 2904 g 60%

## 2018-09-27 NOTE — Progress Notes (Signed)
HIGH-RISK PREGNANCY VISIT Patient name: Tara Stark MRN [id number]  Date of birth: 10-05-1980 Chief Complaint:   Routine Prenatal Visit (U/S)  History of Present Illness:   Tara Stark is a 38 y.o. G18P1001 female at [redacted]w[redacted]d with an Estimated Date of Delivery: 10/25/18 being seen today for ongoing management of a high-risk pregnancy complicated by [facility] on Norvasc 10mg , short cx, 8cm retroplacental fibroid, h/o pre-e, h/o FGR.  Today she reports no complaints. Hasn't taken bp meds yet this am. Denies ha, visual changes, ruq/epigastric pain, n/v.   Contractions: Irregular.  .  Movement: Present. denies leaking of fluid.  Review of Systems:   Pertinent items are noted in HPI Denies abnormal vaginal discharge w/ itching/odor/irritation, headaches, visual changes, shortness of breath, chest pain, abdominal pain, severe nausea/vomiting, or problems with urination or bowel movements unless otherwise stated above. Pertinent History Reviewed:  Reviewed past medical,surgical, social, obstetrical and family history.  Reviewed problem list, medications and allergies. Physical Assessment:   Vitals:   09/27/18 1006  BP: (!) 147/95  Pulse: (!) 103  Weight: 195 lb 6.4 oz (88.6 kg)  Body mass index is 39.47 kg/m.           Physical Examination:   General appearance: alert, well appearing, and in no distress  Mental status: alert, oriented to person, place, and time  Skin: warm & dry   Extremities: Edema: None    Cardiovascular: normal heart rate noted  Respiratory: normal respiratory effort, no distress  Abdomen: gravid, soft, non-tender  Pelvic: Cervical exam deferred         Fetal Status: Fetal Heart Rate (bpm): 140   Movement: Present Presentation: Vertex  Fetal Surveillance Testing today: [place] 36 wks,cephalic,anterior placenta gr 3,fhr 140 bpm,BPP 8/8,anterior retroplacental fibroid 8 x 5.1 x 3.8 cm,afi 12.7 cm,RI .55,.52,.58,.61=47%,EFW 2904 g 60%   Results for orders placed or  performed in visit on 09/27/18 (from the past 24 hour(s))  POC Urinalysis Dipstick OB   Collection Time: 09/27/18 10:09 AM  Result Value Ref Range   Color, UA     Clarity, UA     Glucose, UA Negative Negative   Bilirubin, UA     Ketones, UA large    Spec Grav, UA     Blood, UA small    pH, UA     POC,PROTEIN,UA Small (1+) Negative, Trace, Small (1+), Moderate (2+), Large (3+), 4+   Urobilinogen, UA     Nitrite, UA neg    Leukocytes, UA Moderate (2+) (A) Negative   Appearance     Odor      Assessment & Plan:  1) High-risk pregnancy G2P1001 at 104w0d with an Estimated Date of Delivery: 10/25/18   2) CHTN, stable, hasn't taken bp meds this am  3) H/O pre-e, stable, reviewed pre-e s/s, reasons to seek care  4) Short cx, s/p BMZ 1/6 & 1/7  5) Retroplacental fibroid> 8cm today, normal EFW   Meds: No orders of the defined types were placed in this encounter.   Labs/procedures today: u/s  Treatment Plan:   2x/wk testing nst/sono      Deliver @ 39wks  Reviewed: Preterm labor symptoms and general obstetric precautions including but not limited to vaginal bleeding, contractions, leaking of fluid and fetal movement were reviewed in detail with the patient.  All questions were answered.  Follow-up: Return for As scheduled Friday for hrob/nst.  Orders Placed This Encounter  Procedures  . POC Urinalysis Dipstick OB   Royetta Crochet  Gaspar Garbe, [facility] 09/27/2018 10:26 AM

## 2018-09-30 ENCOUNTER — Ambulatory Visit (INDEPENDENT_AMBULATORY_CARE_PROVIDER_SITE_OTHER): Payer: Medicaid Other | Admitting: Obstetrics and Gynecology

## 2018-09-30 VITALS — BP 148/96 | HR 101 | Wt 196.2 lb

## 2018-09-30 DIAGNOSIS — O26873 Cervical shortening, third trimester: Secondary | ICD-10-CM

## 2018-09-30 DIAGNOSIS — Z1389 Encounter for screening for other disorder: Secondary | ICD-10-CM

## 2018-09-30 DIAGNOSIS — Z331 Pregnant state, incidental: Secondary | ICD-10-CM | POA: Diagnosis not present

## 2018-09-30 DIAGNOSIS — Z3A36 36 weeks gestation of pregnancy: Secondary | ICD-10-CM

## 2018-09-30 DIAGNOSIS — O0993 Supervision of high risk pregnancy, unspecified, third trimester: Secondary | ICD-10-CM | POA: Diagnosis not present

## 2018-09-30 DIAGNOSIS — O10919 Unspecified pre-existing hypertension complicating pregnancy, unspecified trimester: Secondary | ICD-10-CM

## 2018-09-30 DIAGNOSIS — O10913 Unspecified pre-existing hypertension complicating pregnancy, third trimester: Secondary | ICD-10-CM

## 2018-09-30 LAB — POCT URINALYSIS DIPSTICK OB
Blood, UA: NEGATIVE
GLUCOSE, UA: NEGATIVE
Ketones, UA: NEGATIVE
NITRITE UA: NEGATIVE

## 2018-09-30 MED ORDER — LABETALOL HCL 100 MG PO TABS: 100.0000 mg | ORAL_TABLET | Freq: Two times a day (BID) | ORAL | 1 refills | Status: DC

## 2018-09-30 NOTE — Progress Notes (Signed)
Patient ID: Tara Stark, female   DOB: [birth year], 38 y.o.   MRN: [id number]    [facility] PREGNANCY VISIT Patient name: Tara Stark MRN [id number]  Date of birth: 03-Dec-1980 Chief Complaint:   High Risk Gestation (NST)  History of Present Illness:   Tara Stark is a 38 y.o. G53P1001 female at [redacted]w[redacted]d with an Estimated Date of Delivery: 10/25/18 being seen today for ongoing management of a high-risk pregnancy complicated by chronic HTN, short cervix.  Pt has hx of pre-eclampsia and FGR. Pt has retroplacental fibroid 8 x 5.1 x 3.8 cm. Took Norvasc late Tuesday and has taken Norvasc today. Denies headache and blurry vision but has ringing her ear. Today she reports ear ringing. Contractions: Not present. Vag. Bleeding: None.  Movement: Present. denies leaking of fluid.  Review of Systems:   Pertinent items are noted in HPI Denies abnormal vaginal discharge w/ itching/odor/irritation, headaches, visual changes, shortness of breath, chest pain, abdominal pain, severe nausea/vomiting, or problems with urination or bowel movements unless otherwise stated above. Pertinent History Reviewed:  Reviewed past medical,surgical, social, obstetrical and family history.  Reviewed problem list, medications and allergies. Physical Assessment:   Vitals:   09/30/18 0916  BP: (!) 149/94  Pulse: 100  Weight: 196 lb 3.2 oz (89 kg)  Body mass index is 39.63 kg/m.           Physical Examination:   General appearance: alert, well appearing, and in no distress  Mental status: alert, oriented to person, place, and time, normal mood, behavior, speech, dress, motor activity, and thought processes, affect appropriate to mood  Skin: warm & dry   Extremities: Edema: None    Cardiovascular: normal heart rate noted  Respiratory: normal respiratory effort, no distress  Abdomen: gravid, soft, non-tender  Reflexes: 1 +   Ankles: No clomus  Pelvic: Cervical exam deferred         Fetal Status:     Movement:  Present    Fetal Surveillance Testing today: NST   Results for orders placed or performed in visit on 09/30/18 (from the past 24 hour(s))  POC Urinalysis Dipstick OB   Collection Time: 09/30/18  9:19 AM  Result Value Ref Range   Color, UA     Clarity, UA     Glucose, UA Negative Negative   Bilirubin, UA     Ketones, UA neg    Spec Grav, UA     Blood, UA neg    pH, UA     POC,PROTEIN,UA Moderate (2+) Negative, Trace, Small (1+), Moderate (2+), Large (3+), 4+   Urobilinogen, UA     Nitrite, UA neg    Leukocytes, UA Large (3+) (A) Negative   Appearance     Odor      Assessment & Plan:  1) High-risk pregnancy G2P1001 at [redacted]w[redacted]d with an Estimated Date of Delivery: 10/25/18   2) CHTN, , increasing in elevattion 148/96 BP upon re-check  3) Short cervix, stable  Meds: No orders of the defined types were placed in this encounter.   Labs/procedures today:  CBC, CMET, NST= reactive  Treatment Plan:  1. Labetalol 100 mg BID 2. [place] labs to be drawn, BP re-check 3. U/S BPP 10/04/2018 4. UA, protein creatinine  Reviewed: Preterm labor symptoms and general obstetric precautions including but not limited to vaginal bleeding, contractions, leaking of fluid and fetal movement were reviewed in detail with the patient.  All questions were answered.  Follow-up: No follow-ups on file.  Orders  Placed This Encounter  Procedures  . POC Urinalysis Dipstick OB   By signing my name below, I, Samul Dada, attest that this documentation has been prepared under the direction and in the presence of Jonnie Kind, MD. Electronically Signed: [place]. 09/30/18. 9:25 AM.  I personally performed the services described in this documentation, which was SCRIBED in my presence. The recorded information has been reviewed and considered accurate. It has been edited as necessary during review. Jonnie Kind, MD

## 2018-09-30 NOTE — Addendum Note (Signed)
Addended by: Christiana Pellant A on: 09/30/2018 11:24 AM   Modules accepted: Orders

## 2018-10-01 LAB — COMPREHENSIVE METABOLIC PANEL
ALK PHOS: 143 IU/L — AB (ref 39–117)
ALT: 9 IU/L (ref 0–32)
AST: 13 IU/L (ref 0–40)
Albumin/Globulin Ratio: 1.2 (ref 1.2–2.2)
Albumin: 3.4 g/dL — ABNORMAL LOW (ref 3.8–4.8)
BUN/Creatinine Ratio: 10 (ref 9–23)
BUN: 6 mg/dL (ref 6–20)
Bilirubin Total: 0.2 mg/dL (ref 0.0–1.2)
CO2: 19 mmol/L — AB (ref 20–29)
Calcium: 9.3 mg/dL (ref 8.7–10.2)
Chloride: 104 mmol/L (ref 96–106)
Creatinine, Ser: 0.62 mg/dL (ref 0.57–1.00)
GFR calc Af Amer: 133 mL/min/{1.73_m2} (ref >59)
GFR calc non Af Amer: 116 mL/min/{1.73_m2} (ref >59)
Globulin, Total: 2.8 g/dL (ref 1.5–4.5)
Glucose: 91 mg/dL (ref 65–99)
Potassium: 4.4 mmol/L (ref 3.5–5.2)
Sodium: 139 mmol/L (ref 134–144)
Total Protein: 6.2 g/dL (ref 6.0–8.5)

## 2018-10-01 LAB — CBC
Hematocrit: 37.4 % (ref 34.0–46.6)
Hemoglobin: 12.8 g/dL (ref 11.1–15.9)
MCH: 27.5 pg (ref 26.6–33.0)
MCHC: 34.2 g/dL (ref 31.5–35.7)
MCV: 80 fL (ref 79–97)
Platelets: 226 10*3/uL (ref 150–450)
RBC: 4.66 x10E6/uL (ref 3.77–5.28)
RDW: 14.2 % (ref 11.7–15.4)
WBC: 10.7 10*3/uL (ref 3.4–10.8)

## 2018-10-01 LAB — PROTEIN / CREATININE RATIO, URINE
Creatinine, Urine: 34.2 mg/dL
Protein, Ur: 18.5 mg/dL
Protein/Creat Ratio: 541 mg/g creat — ABNORMAL HIGH (ref 0–200)

## 2018-10-02 ENCOUNTER — Other Ambulatory Visit: Payer: Self-pay

## 2018-10-02 ENCOUNTER — Encounter (HOSPITAL_COMMUNITY): Payer: Self-pay

## 2018-10-02 ENCOUNTER — Inpatient Hospital Stay (HOSPITAL_COMMUNITY)
Admission: AD | Admit: 2018-10-02 | Discharge: 2018-10-02 | Disposition: A | Discharge status: Home / Self Care | Payer: Medicaid Other | Attending: Obstetrics & Gynecology | Admitting: Obstetrics & Gynecology

## 2018-10-02 ENCOUNTER — Telehealth: Payer: Self-pay | Admitting: Obstetrics and Gynecology

## 2018-10-02 ENCOUNTER — Other Ambulatory Visit: Payer: Self-pay | Admitting: Obstetrics and Gynecology

## 2018-10-02 DIAGNOSIS — Z803 Family history of malignant neoplasm of breast: Secondary | ICD-10-CM | POA: Diagnosis not present

## 2018-10-02 DIAGNOSIS — O163 Unspecified maternal hypertension, third trimester: Secondary | ICD-10-CM | POA: Diagnosis not present

## 2018-10-02 DIAGNOSIS — Z79899 Other long term (current) drug therapy: Secondary | ICD-10-CM | POA: Insufficient documentation

## 2018-10-02 DIAGNOSIS — O9989 Other specified diseases and conditions complicating pregnancy, childbirth and the puerperium: Secondary | ICD-10-CM | POA: Insufficient documentation

## 2018-10-02 DIAGNOSIS — O26893 Other specified pregnancy related conditions, third trimester: Secondary | ICD-10-CM | POA: Diagnosis not present

## 2018-10-02 DIAGNOSIS — O99613 Diseases of the digestive system complicating pregnancy, third trimester: Secondary | ICD-10-CM | POA: Diagnosis not present

## 2018-10-02 DIAGNOSIS — O2243 Hemorrhoids in pregnancy, third trimester: Secondary | ICD-10-CM | POA: Diagnosis not present

## 2018-10-02 DIAGNOSIS — M5432 Sciatica, left side: Secondary | ICD-10-CM | POA: Diagnosis not present

## 2018-10-02 DIAGNOSIS — E079 Disorder of thyroid, unspecified: Secondary | ICD-10-CM | POA: Insufficient documentation

## 2018-10-02 DIAGNOSIS — O10919 Unspecified pre-existing hypertension complicating pregnancy, unspecified trimester: Secondary | ICD-10-CM

## 2018-10-02 DIAGNOSIS — Z3A36 36 weeks gestation of pregnancy: Secondary | ICD-10-CM | POA: Insufficient documentation

## 2018-10-02 DIAGNOSIS — Z833 Family history of diabetes mellitus: Secondary | ICD-10-CM | POA: Diagnosis not present

## 2018-10-02 DIAGNOSIS — O99283 Endocrine, nutritional and metabolic diseases complicating pregnancy, third trimester: Secondary | ICD-10-CM | POA: Diagnosis not present

## 2018-10-02 DIAGNOSIS — M5442 Lumbago with sciatica, left side: Secondary | ICD-10-CM | POA: Diagnosis not present

## 2018-10-02 DIAGNOSIS — Z87891 Personal history of nicotine dependence: Secondary | ICD-10-CM | POA: Insufficient documentation

## 2018-10-02 DIAGNOSIS — Z800 Family history of malignant neoplasm of digestive organs: Secondary | ICD-10-CM | POA: Diagnosis not present

## 2018-10-02 DIAGNOSIS — K30 Functional dyspepsia: Secondary | ICD-10-CM | POA: Diagnosis not present

## 2018-10-02 DIAGNOSIS — O10913 Unspecified pre-existing hypertension complicating pregnancy, third trimester: Secondary | ICD-10-CM | POA: Diagnosis not present

## 2018-10-02 LAB — URINALYSIS, ROUTINE W REFLEX MICROSCOPIC
Bilirubin Urine: NEGATIVE
Glucose, UA: NEGATIVE mg/dL
Hgb urine dipstick: NEGATIVE
Ketones, ur: NEGATIVE mg/dL
Nitrite: NEGATIVE
Protein, ur: NEGATIVE mg/dL
Specific Gravity, Urine: 1.009 (ref 1.005–1.030)
pH: 7 (ref 5.0–8.0)

## 2018-10-02 LAB — CBC WITH DIFFERENTIAL/PLATELET
Abs Immature Granulocytes: 0.05 10*3/uL (ref 0.00–0.07)
Basophils Absolute: 0 10*3/uL (ref 0.0–0.1)
Basophils Relative: 0 %
Eosinophils Absolute: 0.1 10*3/uL (ref 0.0–0.5)
Eosinophils Relative: 1 %
HCT: 35.8 % — ABNORMAL LOW (ref 36.0–46.0)
Hemoglobin: 11.7 g/dL — ABNORMAL LOW (ref 12.0–15.0)
Immature Granulocytes: 1 %
LYMPHS ABS: 2.1 10*3/uL (ref 0.7–4.0)
Lymphocytes Relative: 21 %
MCH: 26.6 pg (ref 26.0–34.0)
MCHC: 32.7 g/dL (ref 30.0–36.0)
MCV: 81.4 fL (ref 80.0–100.0)
MONO ABS: 1 10*3/uL (ref 0.1–1.0)
Monocytes Relative: 10 %
Neutro Abs: 6.8 10*3/uL (ref 1.7–7.7)
Neutrophils Relative %: 67 %
Platelets: 217 10*3/uL (ref 150–400)
RBC: 4.4 MIL/uL (ref 3.87–5.11)
RDW: 14.6 % (ref 11.5–15.5)
WBC: 10 10*3/uL (ref 4.0–10.5)
nRBC: 0 % (ref 0.0–0.2)

## 2018-10-02 NOTE — MAU Note (Signed)
Pt sent to MAU by Dr. Glo Herring to be evaluated for UTI vs. Pyelo.  Pt c/o left lower back pain that began 2 days ago.  Reports burning and urgency with urination. Reports +FM, denies VB or LOF.

## 2018-10-02 NOTE — Telephone Encounter (Signed)
Left message for pt to call me for results of urine Pr/Cr indicating pt now has superimposed preeclampsia.  Pt asked to call me , given cell # to call. Will schedule IOL for 37wk, if pt remains stable. IOL posted, for 7 30 Tuesday.  Pt called me back, informed of IOL plans: says she is fine at present.but has not been checking pressures at home. Pt reports unilateral back pain, so to rule out UTI/ pyelo, she's to go to MAU to rule out pyelo. And check pressures/ NST.   Pt requests IOL be moved to [place] due to transportation difficulties on Tuesday

## 2018-10-02 NOTE — Discharge Instructions (Signed)
Back Exercises The following exercises strengthen the muscles that help to support the back. They also help to keep the lower back flexible. Doing these exercises can help to prevent back pain or lessen existing pain. If you have back pain or discomfort, try doing these exercises 2-3 times each day or as told by your health care provider. When the pain goes away, do them once each day, but increase the number of times that you repeat the steps for each exercise (do more repetitions). If you do not have back pain or discomfort, do these exercises once each day or as told by your health care provider. Exercises Single Knee to Chest Repeat these steps 3-5 times for each leg: 1. Lie on your back on a firm bed or the floor with your legs extended. 2. Bring one knee to your chest. Your other leg should stay extended and in contact with the floor. 3. Hold your knee in place by grabbing your knee or thigh. 4. Pull on your knee until you feel a gentle stretch in your lower back. 5. Hold the stretch for 10-30 seconds. 6. Slowly release and straighten your leg. Pelvic Tilt Repeat these steps 5-10 times: 1. Lie on your back on a firm bed or the floor with your legs extended. 2. Bend your knees so they are pointing toward the ceiling and your feet are flat on the floor. 3. Tighten your lower abdominal muscles to press your lower back against the floor. This motion will tilt your pelvis so your tailbone points up toward the ceiling instead of pointing to your feet or the floor. 4. With gentle tension and even breathing, hold this position for 5-10 seconds. Cat-Cow Repeat these steps until your lower back becomes more flexible: 1. Get into a hands-and-knees position on a firm surface. Keep your hands under your shoulders, and keep your knees under your hips. You may place padding under your knees for comfort. 2. Let your head hang down, and point your tailbone toward the floor so your lower back becomes  rounded like the back of a cat. 3. Hold this position for 5 seconds. 4. Slowly lift your head and point your tailbone up toward the ceiling so your back forms a sagging arch like the back of a cow. 5. Hold this position for 5 seconds.  Press-Ups Repeat these steps 5-10 times: 1. Lie on your abdomen (face-down) on the floor. 2. Place your palms near your head, about shoulder-width apart. 3. While you keep your back as relaxed as possible and keep your hips on the floor, slowly straighten your arms to raise the top half of your body and lift your shoulders. Do not use your back muscles to raise your upper torso. You may adjust the placement of your hands to make yourself more comfortable. 4. Hold this position for 5 seconds while you keep your back relaxed. 5. Slowly return to lying flat on the floor.  Bridges Repeat these steps 10 times: 1. Lie on your back on a firm surface. 2. Bend your knees so they are pointing toward the ceiling and your feet are flat on the floor. 3. Tighten your buttocks muscles and lift your buttocks off of the floor until your waist is at almost the same height as your knees. You should feel the muscles working in your buttocks and the back of your thighs. If you do not feel these muscles, slide your feet 1-2 inches farther away from your buttocks. 4. Hold this position  for 3-5 seconds. 5. Slowly lower your hips to the starting position, and allow your buttocks muscles to relax completely. If this exercise is too easy, try doing it with your arms crossed over your chest. Abdominal Crunches Repeat these steps 5-10 times: 1. Lie on your back on a firm bed or the floor with your legs extended. 2. Bend your knees so they are pointing toward the ceiling and your feet are flat on the floor. 3. Cross your arms over your chest. 4. Tip your chin slightly toward your chest without bending your neck. 5. Tighten your abdominal muscles and slowly raise your trunk (torso) high  enough to lift your shoulder blades a tiny bit off of the floor. Avoid raising your torso higher than that, because it can put too much stress on your low back and it does not help to strengthen your abdominal muscles. 6. Slowly return to your starting position. Back Lifts Repeat these steps 5-10 times: 1. Lie on your abdomen (face-down) with your arms at your sides, and rest your forehead on the floor. 2. Tighten the muscles in your legs and your buttocks. 3. Slowly lift your chest off of the floor while you keep your hips pressed to the floor. Keep the back of your head in line with the curve in your back. Your eyes should be looking at the floor. 4. Hold this position for 3-5 seconds. 5. Slowly return to your starting position. Contact a health care provider if:  Your back pain or discomfort gets much worse when you do an exercise.  Your back pain or discomfort does not lessen within 2 hours after you exercise. If you have any of these problems, stop doing these exercises right away. Do not do them again unless your health care provider says that you can. Get help right away if:  You develop sudden, severe back pain. If this happens, stop doing the exercises right away. Do not do them again unless your health care provider says that you can. This information is not intended to replace advice given to you by your health care provider. Make sure you discuss any questions you have with your health care provider. Document Released: 08/20/2004 Document Revised: 11/16/2017 Document Reviewed: 09/06/2014 Elsevier Interactive Patient Education  2019 [place] Medications in Pregnancy   Acne: Benzoyl Peroxide Salicylic Acid  Backache/Headache: Tylenol: 2 regular strength every 4 hours OR              2 Extra strength every 6 hours  Colds/Coughs/Allergies: Benadryl (alcohol free) 25 mg every 6 hours as needed Breath right strips Claritin Cepacol throat lozenges Chloraseptic  throat spray Cold-Eeze- up to three times per day Cough drops, alcohol free Flonase (by prescription only) Guaifenesin Mucinex Robitussin DM (plain only, alcohol free) Saline nasal spray/drops Sudafed (pseudoephedrine) & Actifed ** use only after [redacted] weeks gestation and if you do not have high blood pressure Tylenol Vicks Vaporub Zinc lozenges Zyrtec   Constipation: Colace Ducolax suppositories Fleet enema Glycerin suppositories Metamucil Milk of magnesia Miralax Senokot Smooth move tea  Diarrhea: Kaopectate Imodium A-D  *NO pepto Bismol  Hemorrhoids: Anusol Anusol HC Preparation H Tucks  Indigestion: Tums Maalox Mylanta Zantac  Pepcid  Insomnia: Benadryl (alcohol free) 25mg  every 6 hours as needed Tylenol PM Unisom, no Gelcaps  Leg Cramps: Tums MagGel  Nausea/Vomiting:  Bonine Dramamine Emetrol Ginger extract Sea bands Meclizine  Nausea medication to take during pregnancy:  Unisom (doxylamine succinate 25 mg tablets) Take one tablet  daily at bedtime. If symptoms are not adequately controlled, the dose can be increased to a maximum recommended dose of two tablets daily (1/2 tablet in the morning, 1/2 tablet mid-afternoon and one at bedtime). Vitamin B6 100mg  tablets. Take one tablet twice a day (up to 200 mg per day).  Skin Rashes: Aveeno products Benadryl cream or 25mg  every 6 hours as needed Calamine Lotion 1% cortisone cream  Yeast infection: Gyne-lotrimin 7 Monistat 7   **If taking multiple medications, please check labels to avoid duplicating the same active ingredients **take medication as directed on the label ** Do not exceed 4000 mg of tylenol in 24 hours **Do not take medications that contain aspirin or ibuprofen

## 2018-10-02 NOTE — MAU Provider Note (Signed)
History     CSN: [id number]  Arrival date and time: 10/02/18 8099   First Provider Initiated Contact with Patient 10/02/18 1840      Chief Complaint  Patient presents with  . Possible UTI or Pyelo   HPI Tara Stark is a 38 y.o. G2P1001 at [redacted]w[redacted]d who presents with left lower back pain. She states it has been ongoing but worse the last 2 days. She states it starts in her lower back and goes down the back of her leg. She also reports that the pain is worse with movement. She denies any leaking or bleeding. Reports normal fetal movement. She has CHTN on norvasc and labetalol. She denies HA, visual changes or epigastric pain.  Dr. Glo Herring instructed patient to come to MAU to rule out UTI/pyelonephritis. She denies any dysuria. She reports frequency after drinking something but none otherwise.   OB History    Gravida  2   Para  1   Term  1   Preterm  0   AB  0   Living  1     SAB  0   TAB  0   Ectopic  0   Multiple  0   Live Births  1           Past Medical History:  Diagnosis Date  . Acid indigestion   . Hemorrhoids   . Hypertension   . Thyroid disease     Past Surgical History:  Procedure Laterality Date  . NO PAST SURGERIES      Family History  Problem Relation Age of Onset  . Other Paternal Grandfather        MVA  . Breast cancer Paternal Grandmother   . Alzheimer's disease Maternal Grandmother   . Cancer Maternal Grandfather        throat  . Diabetes Father   . Other Father        has a pacemaker  . Hypertension Mother   . Hypertension Brother   . Hypertension Sister   . Hypertension Sister   . Bronchiolitis Son   . Hypertension Paternal Aunt     Social History   Tobacco Use  . Smoking status: Former Smoker    Years: 4.00    Types: Cigarettes  . Smokeless tobacco: Never Used  . Tobacco comment: 2-3 cigarettes per day  Substance Use Topics  . Alcohol use: No  . Drug use: No    Allergies: No Known Allergies  Medications  Prior to Admission  Medication Sig Dispense Refill Last Dose  . amLODipine (NORVASC) 10 MG tablet Take 1 tablet (10 mg total) by mouth daily. 30 tablet 12 10/02/2018 at 1300  . aspirin EC 81 MG tablet Take 162 mg by mouth daily.   10/01/2018 at Unknown time  . labetalol (NORMODYNE) 100 MG tablet Take 1 tablet (100 mg total) by mouth 2 (two) times daily. 30 tablet 1 10/02/2018  . prenatal vitamin w/FE, FA (PRENATAL 1 + 1) 27-1 MG TABS tablet Take 1 tablet by mouth daily at 12 noon. 30 each 12 10/02/2018 at Unknown time  . progesterone (PROMETRIUM) 200 MG capsule Place 1 capsule (200 mg total) vaginally at bedtime. Start @ 12wks of pregnancy (on 04/12/18) 30 capsule 5 10/01/2018 at Unknown time  . acetaminophen (TYLENOL) 500 MG tablet Take 1,000 mg by mouth as needed.   Taking    Review of Systems  Constitutional: Negative.  Negative for fatigue and fever.  HENT: Negative.   Respiratory:  Negative.  Negative for shortness of breath.   Cardiovascular: Negative.  Negative for chest pain.  Gastrointestinal: Negative.  Negative for abdominal pain, constipation, diarrhea, nausea and vomiting.  Genitourinary: Negative.  Negative for dysuria, vaginal bleeding and vaginal discharge.  Musculoskeletal: Positive for back pain.  Neurological: Negative.  Negative for dizziness and headaches.   Physical Exam   Blood pressure 128/81, pulse 100, temperature 98.1 F (36.7 C), temperature source Oral, resp. rate 20, height 4\' 11"  (1.499 m), weight 89.9 kg, last menstrual period 11/28/2017, SpO2 98 %, not currently breastfeeding.  Patient Vitals for the past 24 hrs:  BP Temp Temp src Pulse Resp SpO2 Height Weight  10/02/18 1900 (!) 144/83 - - 96 20 98 % - -  10/02/18 1845 137/81 - - 98 - 96 % - -  10/02/18 1834 128/81 - - 100 - - - -  10/02/18 1824 125/87 - - 100 - - - -  10/02/18 1757 (!) 150/94 98.1 F (36.7 C) Oral (!) 101 20 98 % 4\' 11"  (1.499 m) 89.9 kg   Physical Exam  Nursing note and vitals  reviewed. Constitutional: She is oriented to person, place, and time. She appears well-developed and well-nourished. No distress.  HENT:  Head: Normocephalic.  Eyes: Pupils are equal, round, and reactive to light.  Cardiovascular: Normal rate, regular rhythm and normal heart sounds.  Respiratory: Effort normal and breath sounds normal. No respiratory distress.  GI: Soft. Bowel sounds are normal. She exhibits no distension. There is no abdominal tenderness.  Neurological: She is alert and oriented to person, place, and time.  Skin: Skin is warm and dry.  Psychiatric: She has a normal mood and affect. Her behavior is normal. Judgment and thought content normal.    Fetal Tracing:  Baseline: 140 Variability: moderate Accels: 15x15 Decels: none  Toco: 2-5  MAU Course  Procedures Results for orders placed or performed during the hospital encounter of 10/02/18 (from the past 24 hour(s))  Urinalysis, Routine w reflex microscopic     Status: Abnormal   Collection Time: 10/02/18  6:16 PM  Result Value Ref Range   Color, Urine YELLOW YELLOW   APPearance CLEAR CLEAR   Specific Gravity, Urine 1.009 1.005 - 1.030   pH 7.0 5.0 - 8.0   Glucose, UA NEGATIVE NEGATIVE mg/dL   Hgb urine dipstick NEGATIVE NEGATIVE   Bilirubin Urine NEGATIVE NEGATIVE   Ketones, ur NEGATIVE NEGATIVE mg/dL   Protein, ur NEGATIVE NEGATIVE mg/dL   Nitrite NEGATIVE NEGATIVE   Leukocytes,Ua LARGE (A) NEGATIVE   RBC / HPF 0-5 0 - 5 RBC/hpf   WBC, UA 0-5 0 - 5 WBC/hpf   Bacteria, UA RARE (A) NONE SEEN   Squamous Epithelial / LPF 0-5 0 - 5  CBC with Differential/Platelet     Status: Abnormal   Collection Time: 10/02/18  6:30 PM  Result Value Ref Range   WBC 10.0 4.0 - 10.5 K/uL   RBC 4.40 3.87 - 5.11 MIL/uL   Hemoglobin 11.7 (L) 12.0 - 15.0 g/dL   HCT 35.8 (L) 36.0 - 46.0 %   MCV 81.4 80.0 - 100.0 fL   MCH 26.6 26.0 - 34.0 pg   MCHC 32.7 30.0 - 36.0 g/dL   RDW 14.6 11.5 - 15.5 %   Platelets 217 150 - 400 K/uL    nRBC 0.0 0.0 - 0.2 %   Neutrophils Relative % 67 %   Neutro Abs 6.8 1.7 - 7.7 K/uL   Lymphocytes Relative 21 %  Lymphs Abs 2.1 0.7 - 4.0 K/uL   Monocytes Relative 10 %   Monocytes Absolute 1.0 0.1 - 1.0 K/uL   Eosinophils Relative 1 %   Eosinophils Absolute 0.1 0.0 - 0.5 K/uL   Basophils Relative 0 %   Basophils Absolute 0.0 0.0 - 0.1 K/uL   Immature Granulocytes 1 %   Abs Immature Granulocytes 0.05 0.00 - 0.07 K/uL   MDM UA, UC CBC with Diff  No CVA, afebrile, VSS. Patient denies any urinary complaints. Unlikely pyelonephritis. Large leukocytes in urine, will culture and treat if needed.  BP stable with CHTN. No severe features  Assessment and Plan   1. Sciatica of left side   2. Chronic hypertension affecting pregnancy   3. [redacted] weeks gestation of pregnancy    -Discharge home in stable condition -Comfort measures for muscle pain/sciatica reviewed with patient. -Severe preeclampsia precautions discussed -Patient advised to follow-up with [facility] on Tuesday for induction as scheduled -Patient may return to MAU as needed or if her condition were to change or worsen   Wende Mott CNM 10/02/2018, 6:40 PM

## 2018-10-03 ENCOUNTER — Other Ambulatory Visit (HOSPITAL_COMMUNITY): Payer: Self-pay | Admitting: *Deleted

## 2018-10-03 ENCOUNTER — Encounter (HOSPITAL_COMMUNITY): Payer: Self-pay | Admitting: *Deleted

## 2018-10-03 ENCOUNTER — Telehealth (HOSPITAL_COMMUNITY): Payer: Self-pay | Admitting: *Deleted

## 2018-10-03 ENCOUNTER — Other Ambulatory Visit: Payer: Self-pay | Admitting: Obstetrics & Gynecology

## 2018-10-03 DIAGNOSIS — O10919 Unspecified pre-existing hypertension complicating pregnancy, unspecified trimester: Secondary | ICD-10-CM

## 2018-10-03 NOTE — Telephone Encounter (Signed)
Preadmission screen  

## 2018-10-04 ENCOUNTER — Inpatient Hospital Stay (HOSPITAL_COMMUNITY)
Admission: AD | Admit: 2018-10-04 | Discharge: 2018-10-06 | DRG: 807 | Disposition: A | Discharge status: Home / Self Care | Payer: Medicaid Other | Attending: Family Medicine | Admitting: Family Medicine

## 2018-10-04 ENCOUNTER — Encounter (HOSPITAL_COMMUNITY): Payer: Self-pay | Admitting: Neonatal-Perinatal Medicine

## 2018-10-04 ENCOUNTER — Inpatient Hospital Stay (HOSPITAL_COMMUNITY): Payer: Medicaid Other

## 2018-10-04 ENCOUNTER — Inpatient Hospital Stay: Payer: Medicaid Other

## 2018-10-04 ENCOUNTER — Encounter: Payer: Self-pay | Admitting: Women's Health

## 2018-10-04 ENCOUNTER — Inpatient Hospital Stay (HOSPITAL_COMMUNITY): Payer: Medicaid Other | Admitting: Anesthesiology

## 2018-10-04 DIAGNOSIS — O119 Pre-existing hypertension with pre-eclampsia, unspecified trimester: Secondary | ICD-10-CM | POA: Diagnosis present

## 2018-10-04 DIAGNOSIS — D259 Leiomyoma of uterus, unspecified: Secondary | ICD-10-CM | POA: Diagnosis present

## 2018-10-04 DIAGNOSIS — O99824 Streptococcus B carrier state complicating childbirth: Secondary | ICD-10-CM | POA: Diagnosis present

## 2018-10-04 DIAGNOSIS — O3413 Maternal care for benign tumor of corpus uteri, third trimester: Secondary | ICD-10-CM | POA: Diagnosis present

## 2018-10-04 DIAGNOSIS — O1002 Pre-existing essential hypertension complicating childbirth: Secondary | ICD-10-CM | POA: Diagnosis not present

## 2018-10-04 DIAGNOSIS — O114 Pre-existing hypertension with pre-eclampsia, complicating childbirth: Secondary | ICD-10-CM | POA: Diagnosis not present

## 2018-10-04 DIAGNOSIS — Z3A37 37 weeks gestation of pregnancy: Secondary | ICD-10-CM | POA: Diagnosis not present

## 2018-10-04 DIAGNOSIS — K30 Functional dyspepsia: Secondary | ICD-10-CM | POA: Diagnosis present

## 2018-10-04 DIAGNOSIS — O901 Disruption of perineal obstetric wound: Secondary | ICD-10-CM | POA: Diagnosis not present

## 2018-10-04 DIAGNOSIS — Z880 Allergy status to penicillin: Secondary | ICD-10-CM

## 2018-10-04 DIAGNOSIS — O9962 Diseases of the digestive system complicating childbirth: Secondary | ICD-10-CM | POA: Diagnosis present

## 2018-10-04 DIAGNOSIS — Z3A00 Weeks of gestation of pregnancy not specified: Secondary | ICD-10-CM | POA: Diagnosis not present

## 2018-10-04 DIAGNOSIS — Z87891 Personal history of nicotine dependence: Secondary | ICD-10-CM | POA: Diagnosis not present

## 2018-10-04 DIAGNOSIS — O1092 Unspecified pre-existing hypertension complicating childbirth: Secondary | ICD-10-CM | POA: Diagnosis present

## 2018-10-04 DIAGNOSIS — O43819 Placental infarction, unspecified trimester: Secondary | ICD-10-CM | POA: Diagnosis not present

## 2018-10-04 LAB — CBC
HCT: 35 % — ABNORMAL LOW (ref 36.0–46.0)
HCT: 34.8 % — ABNORMAL LOW (ref 36.0–46.0)
Hemoglobin: 11.4 g/dL — ABNORMAL LOW (ref 12.0–15.0)
Hemoglobin: 11.4 g/dL — ABNORMAL LOW (ref 12.0–15.0)
MCH: 27 pg (ref 26.0–34.0)
MCH: 26.6 pg (ref 26.0–34.0)
MCHC: 32.6 g/dL (ref 30.0–36.0)
MCHC: 32.8 g/dL (ref 30.0–36.0)
MCV: 82.9 fL (ref 80.0–100.0)
MCV: 81.3 fL (ref 80.0–100.0)
PLATELETS: 213 10*3/uL (ref 150–400)
Platelets: 204 10*3/uL (ref 150–400)
RBC: 4.22 MIL/uL (ref 3.87–5.11)
RBC: 4.28 MIL/uL (ref 3.87–5.11)
RDW: 15 % (ref 11.5–15.5)
RDW: 14.9 % (ref 11.5–15.5)
WBC: 10.3 10*3/uL (ref 4.0–10.5)
WBC: 10.5 10*3/uL (ref 4.0–10.5)
nRBC: 0 % (ref 0.0–0.2)
nRBC: 0 % (ref 0.0–0.2)

## 2018-10-04 LAB — COMPREHENSIVE METABOLIC PANEL
ALT: 11 U/L (ref 0–44)
AST: 29 U/L (ref 15–41)
Albumin: 2.7 g/dL — ABNORMAL LOW (ref 3.5–5.0)
Alkaline Phosphatase: 139 U/L — ABNORMAL HIGH (ref 38–126)
Anion gap: 9 (ref 5–15)
BUN: <5 mg/dL — ABNORMAL LOW (ref 6–20)
CO2: 19 mmol/L — ABNORMAL LOW (ref 22–32)
Calcium: 8.8 mg/dL — ABNORMAL LOW (ref 8.9–10.3)
Chloride: 107 mmol/L (ref 98–111)
Creatinine, Ser: 0.41 mg/dL — ABNORMAL LOW (ref 0.44–1.00)
GFR calc Af Amer: >60 mL/min (ref >60)
GFR calc non Af Amer: >60 mL/min (ref >60)
Glucose, Bld: 119 mg/dL — ABNORMAL HIGH (ref 70–99)
POTASSIUM: 4.5 mmol/L (ref 3.5–5.1)
SODIUM: 135 mmol/L (ref 135–145)
Total Bilirubin: 0.9 mg/dL (ref 0.3–1.2)
Total Protein: 5.7 g/dL — ABNORMAL LOW (ref 6.5–8.1)

## 2018-10-04 LAB — TYPE AND SCREEN
ABO/RH(D): A POS
Antibody Screen: NEGATIVE

## 2018-10-04 LAB — PROTEIN / CREATININE RATIO, URINE
Creatinine, Urine: 142.79 mg/dL
Protein Creatinine Ratio: 0.27 mg/mg{Cre} — ABNORMAL HIGH (ref 0.00–0.15)
TOTAL PROTEIN, URINE: 39 mg/dL

## 2018-10-04 LAB — ABO/RH: ABO/RH(D): A POS

## 2018-10-04 LAB — GC/CHLAMYDIA PROBE AMP
Chlamydia trachomatis, NAA: NEGATIVE
Neisseria gonorrhoeae by PCR: NEGATIVE

## 2018-10-04 MED ORDER — LACTATED RINGERS IV SOLN
500.0000 mL | Freq: Once | INTRAVENOUS | Status: AC
  Administered 2018-10-04: 1000 mL via INTRAVENOUS

## 2018-10-04 MED ORDER — PENICILLIN G 3 MILLION UNITS IVPB - SIMPLE MED
3.0000 10*6.[IU] | INTRAVENOUS | Status: DC
  Administered 2018-10-04 (×3): 3 10*6.[IU] via INTRAVENOUS
  Filled 2018-10-04 (×3): qty 100

## 2018-10-04 MED ORDER — OXYTOCIN BOLUS FROM INFUSION
500.0000 mL | Freq: Once | INTRAVENOUS | Status: AC
  Administered 2018-10-04: 500 mL via INTRAVENOUS

## 2018-10-04 MED ORDER — LACTATED RINGERS IV SOLN
INTRAVENOUS | Status: DC
  Administered 2018-10-04: 02:00:00 via INTRAVENOUS

## 2018-10-04 MED ORDER — ACETAMINOPHEN 325 MG PO TABS: 650.0000 mg | ORAL_TABLET | ORAL | Status: DC | PRN

## 2018-10-04 MED ORDER — AMLODIPINE BESYLATE 5 MG PO TABS
10.0000 mg | ORAL_TABLET | Freq: Every day | ORAL | Status: DC
  Administered 2018-10-04: 10 mg via ORAL
  Filled 2018-10-04 (×2): qty 1

## 2018-10-04 MED ORDER — PHENYLEPHRINE 40 MCG/ML (10ML) SYRINGE FOR IV PUSH (FOR BLOOD PRESSURE SUPPORT): 80.0000 ug | PREFILLED_SYRINGE | INTRAVENOUS | Status: DC | PRN

## 2018-10-04 MED ORDER — TERBUTALINE SULFATE 1 MG/ML IJ SOLN: 0.2500 mg | Freq: Once | INTRAMUSCULAR | Status: DC | PRN

## 2018-10-04 MED ORDER — ONDANSETRON HCL 4 MG/2ML IJ SOLN: 4.0000 mg | INTRAMUSCULAR | Status: DC | PRN

## 2018-10-04 MED ORDER — OXYCODONE HCL 5 MG PO TABS: 5.0000 mg | ORAL_TABLET | ORAL | Status: DC | PRN

## 2018-10-04 MED ORDER — LIDOCAINE HCL (PF) 1 % IJ SOLN
INTRAMUSCULAR | Status: DC | PRN
  Administered 2018-10-04: 11 mL via EPIDURAL

## 2018-10-04 MED ORDER — SOD CITRATE-CITRIC ACID 500-334 MG/5ML PO SOLN: 30.0000 mL | ORAL | Status: DC | PRN

## 2018-10-04 MED ORDER — SODIUM CHLORIDE 0.9 % IV SOLN
5.0000 10*6.[IU] | Freq: Once | INTRAVENOUS | Status: AC
  Administered 2018-10-04: 5 10*6.[IU] via INTRAVENOUS
  Filled 2018-10-04: qty 5

## 2018-10-04 MED ORDER — ONDANSETRON HCL 4 MG PO TABS: 4.0000 mg | ORAL_TABLET | ORAL | Status: DC | PRN

## 2018-10-04 MED ORDER — OXYCODONE-ACETAMINOPHEN 5-325 MG PO TABS: 2.0000 | ORAL_TABLET | ORAL | Status: DC | PRN

## 2018-10-04 MED ORDER — OXYCODONE HCL 5 MG PO TABS: 10.0000 mg | ORAL_TABLET | ORAL | Status: DC | PRN

## 2018-10-04 MED ORDER — BENZOCAINE-MENTHOL 20-0.5 % EX AERO
1.0000 "application " | INHALATION_SPRAY | CUTANEOUS | Status: DC | PRN
  Filled 2018-10-04: qty 56

## 2018-10-04 MED ORDER — ACETAMINOPHEN 500 MG PO TABS
1000.0000 mg | ORAL_TABLET | Freq: Four times a day (QID) | ORAL | Status: DC | PRN
  Administered 2018-10-04: 1000 mg via ORAL
  Filled 2018-10-04: qty 2

## 2018-10-04 MED ORDER — COCONUT OIL OIL: 1.0000 "application " | TOPICAL_OIL | Status: DC | PRN

## 2018-10-04 MED ORDER — LACTATED RINGERS IV SOLN: 500.0000 mL | INTRAVENOUS | Status: DC | PRN

## 2018-10-04 MED ORDER — DIPHENHYDRAMINE HCL 50 MG/ML IJ SOLN: 12.5000 mg | INTRAMUSCULAR | Status: DC | PRN

## 2018-10-04 MED ORDER — WITCH HAZEL-GLYCERIN EX PADS: 1.0000 "application " | MEDICATED_PAD | CUTANEOUS | Status: DC | PRN

## 2018-10-04 MED ORDER — ONDANSETRON HCL 4 MG/2ML IJ SOLN: 4.0000 mg | Freq: Four times a day (QID) | INTRAMUSCULAR | Status: DC | PRN

## 2018-10-04 MED ORDER — FLEET ENEMA 7-19 GM/118ML RE ENEM: 1.0000 | ENEMA | RECTAL | Status: DC | PRN

## 2018-10-04 MED ORDER — IBUPROFEN 600 MG PO TABS
600.0000 mg | ORAL_TABLET | Freq: Four times a day (QID) | ORAL | Status: DC
  Administered 2018-10-04 – 2018-10-06 (×8): 600 mg via ORAL
  Filled 2018-10-04 (×8): qty 1

## 2018-10-04 MED ORDER — FENTANYL CITRATE (PF) 100 MCG/2ML IJ SOLN
100.0000 ug | INTRAMUSCULAR | Status: DC | PRN
  Administered 2018-10-04: 100 ug via INTRAVENOUS
  Filled 2018-10-04: qty 2

## 2018-10-04 MED ORDER — DIPHENHYDRAMINE HCL 25 MG PO CAPS: 25.0000 mg | ORAL_CAPSULE | Freq: Four times a day (QID) | ORAL | Status: DC | PRN

## 2018-10-04 MED ORDER — DIBUCAINE 1 % RE OINT
1.0000 "application " | TOPICAL_OINTMENT | RECTAL | Status: DC | PRN
  Administered 2018-10-04: 1 via RECTAL
  Filled 2018-10-04: qty 28

## 2018-10-04 MED ORDER — OXYTOCIN 40 UNITS IN NORMAL SALINE INFUSION - SIMPLE MED
1.0000 m[IU]/min | INTRAVENOUS | Status: DC
  Administered 2018-10-04: 2 m[IU]/min via INTRAVENOUS

## 2018-10-04 MED ORDER — OXYCODONE-ACETAMINOPHEN 5-325 MG PO TABS: 1.0000 | ORAL_TABLET | ORAL | Status: DC | PRN

## 2018-10-04 MED ORDER — OXYTOCIN 40 UNITS IN NORMAL SALINE INFUSION - SIMPLE MED
1.0000 m[IU]/min | INTRAVENOUS | Status: DC
  Filled 2018-10-04: qty 1000

## 2018-10-04 MED ORDER — SODIUM CHLORIDE (PF) 0.9 % IJ SOLN
INTRAMUSCULAR | Status: DC | PRN
  Administered 2018-10-04: 12 mL/h via EPIDURAL

## 2018-10-04 MED ORDER — FENTANYL-BUPIVACAINE-NACL 0.5-0.125-0.9 MG/250ML-% EP SOLN
12.0000 mL/h | EPIDURAL | Status: DC | PRN
  Filled 2018-10-04: qty 250

## 2018-10-04 MED ORDER — SENNOSIDES-DOCUSATE SODIUM 8.6-50 MG PO TABS
2.0000 | ORAL_TABLET | ORAL | Status: DC
  Administered 2018-10-04 – 2018-10-05 (×2): 2 via ORAL
  Filled 2018-10-04 (×2): qty 2

## 2018-10-04 MED ORDER — ZOLPIDEM TARTRATE 5 MG PO TABS: 5.0000 mg | ORAL_TABLET | Freq: Every evening | ORAL | Status: DC | PRN

## 2018-10-04 MED ORDER — OXYTOCIN 40 UNITS IN NORMAL SALINE INFUSION - SIMPLE MED
2.5000 [IU]/h | INTRAVENOUS | Status: DC
  Administered 2018-10-04 (×2): 2.5 [IU]/h via INTRAVENOUS

## 2018-10-04 MED ORDER — EPHEDRINE 5 MG/ML INJ: 10.0000 mg | INTRAVENOUS | Status: DC | PRN

## 2018-10-04 MED ORDER — PRENATAL MULTIVITAMIN CH
1.0000 | ORAL_TABLET | Freq: Every day | ORAL | Status: DC
  Administered 2018-10-05 – 2018-10-06 (×2): 1 via ORAL
  Filled 2018-10-04 (×2): qty 1

## 2018-10-04 MED ORDER — SIMETHICONE 80 MG PO CHEW: 80.0000 mg | CHEWABLE_TABLET | ORAL | Status: DC | PRN

## 2018-10-04 MED ORDER — LABETALOL HCL 100 MG PO TABS
100.0000 mg | ORAL_TABLET | Freq: Two times a day (BID) | ORAL | Status: DC
  Administered 2018-10-04: 100 mg via ORAL
  Filled 2018-10-04: qty 1

## 2018-10-04 MED ORDER — LIDOCAINE HCL (PF) 1 % IJ SOLN: 30.0000 mL | INTRAMUSCULAR | Status: DC | PRN

## 2018-10-04 NOTE — Anesthesia Pain Management Evaluation Note (Signed)
  [facility] Pain Management Visit Note  Patient: CLEOPHA INDELICATO, 38 y.o., female  "Hello I am a member of the anesthesia team at [facility] [place]. We have an anesthesia team available at all times to provide care throughout the hospital, including epidural management and anesthesia for C-section. I don't know your plan for the delivery whether it a natural birth, water birth, IV sedation, nitrous supplementation, doula or epidural, but we want to meet your pain goals."   1.Was your pain managed to your expectations on prior hospitalizations?   Yes   2.What is your expectation for pain management during this hospitalization?     Epidural  3.How can we help you reach that goal? Epidural when desired  Record the patient's initial score and the patient's pain goal.   Pain: 0  Pain Goal: undecided  [facility] wants you to be able to say your pain was always managed very well.  Cynthie Garmon 10/04/2018

## 2018-10-04 NOTE — Progress Notes (Signed)
LABOR PROGRESS NOTE  Tara Stark is a 38 y.o. G2P1001 at [redacted]w[redacted]d admitted for IOL for cHTN/SIPE  Subjective: Feeling lots of pain with contractions  Objective: BP (!) 171/100   Pulse (!) 101   Temp 97.8 F (36.6 C) (Oral)   Resp 20   Ht 4\' 11"  (1.499 m)   Wt 90.7 kg   LMP 11/28/2017   BMI 40.40 kg/m  or  Vitals:   10/04/18 1001 10/04/18 1034 10/04/18 1101 10/04/18 1131  BP: 136/76 126/79 (!) 149/100 (!) 171/100  Pulse: 99 97 (!) 105 (!) 101  Resp: 20 18 18 20   Temp:      TempSrc:      Weight:      Height:        Dilation: 6 Effacement (%): 80 Cervical Position: Anterior Station: -2 Presentation: Vertex Exam by:: Dr. Andrew Au: baseline rate 140, moderate varibility, + acel, early decel Toco: regular q2-38m   Labs: Lab Results  Component Value Date   WBC 10.5 10/04/2018   HGB 11.4 (L) 10/04/2018   HCT 34.8 (L) 10/04/2018   MCV 81.3 10/04/2018   PLT 204 10/04/2018    Patient Active Problem List   Diagnosis Date Noted  . Chronic hypertension with superimposed preeclampsia 10/04/2018  . Uterine fibroid 04/21/2018  . History of prior pregnancy with IUGR newborn 04/15/2018  . Supervision of high risk pregnancy, antepartum 03/17/2018  . Chronic hypertension during pregnancy, antepartum 03/09/2018  . Smoker 03/09/2018  . History of cervical cerclage 03/09/2018  . Hypothyroidism 03/09/2018    Assessment / Plan: 38 y.o. G2P1001 at [redacted]w[redacted]d here for IOL for cHTN/SIPE  Labor: progressing. AROM now. Continue pitocin  Fetal Wellbeing:  Cat 2 (reassuing) Pain Control:  Desires epidural Anticipated MOD:  SVD  Aura Camps, MD  OB Fellow  10/04/2018, 12:06 PM

## 2018-10-04 NOTE — H&P (Addendum)
Tara Stark is a 38 y.o. female G2P1001 with IUP at [redacted]w[redacted]d presenting for IOL for CHTN/SIPE. [facility] at [facility]  Prenatal History/Complications: HX FGR Retroplacental fibroid CHTN (on norvasc) SIPE (dx 2 days ago; labetalol added)    Past Medical History: Past Medical History:  Diagnosis Date  . Acid indigestion   . Fibroid   . Hemorrhoids   . Hypertension   . Thyroid disease     Past Surgical History: Past Surgical History:  Procedure Laterality Date  . NO PAST SURGERIES      Obstetrical History: OB History    Gravida  2   Para  1   Term  1   Preterm  0   AB  0   Living  1     SAB  0   TAB  0   Ectopic  0   Multiple  0   Live Births  1           Social History: Social History   Socioeconomic History  . Marital status: Married    Spouse name: Elzabeth Mcquerry  . Number of children: 1  . Years of education: Not on file  . Highest education level: Associate degree: occupational, Hotel manager, or vocational program  Occupational History  . Not on file  Social Needs  . Financial resource strain: Somewhat hard  . Food insecurity:    Worry: Sometimes true    Inability: Sometimes true  . Transportation needs:    Medical: No    Non-medical: No  Tobacco Use  . Smoking status: Former Smoker    Years: 4.00    Types: Cigarettes  . Smokeless tobacco: Never Used  . Tobacco comment: 2-3 cigarettes per day  Substance and Sexual Activity  . Alcohol use: No  . Drug use: No  . Sexual activity: Not Currently    Birth control/protection: None  Lifestyle  . Physical activity:    Days per week: 3 days    Minutes per session: 30 min  . Stress: Only a little  Relationships  . Social connections:    Talks on phone: Once a week    Gets together: Twice a week    Attends religious service: 1 to 4 times per year    Active member of club or organization: No    Attends meetings of clubs or organizations: Never    Relationship status: Married  Other  Topics Concern  . Not on file  Social History Narrative  . Not on file    Family History: Family History  Problem Relation Age of Onset  . Other Paternal Grandfather        MVA  . Breast cancer Paternal Grandmother   . Alzheimer's disease Maternal Grandmother   . Cancer Maternal Grandfather        throat  . Diabetes Father   . Other Father        has a pacemaker  . Hypertension Mother   . Hypertension Brother   . Hypertension Sister   . Hypertension Sister   . Bronchiolitis Son   . Hypertension Paternal Aunt     Allergies: No Known Allergies  Medications Prior to Admission  Medication Sig Dispense Refill Last Dose  . acetaminophen (TYLENOL) 500 MG tablet Take 1,000 mg by mouth as needed.   Taking  . amLODipine (NORVASC) 10 MG tablet Take 1 tablet (10 mg total) by mouth daily. 30 tablet 12 10/02/2018 at 1300  . aspirin EC 81 MG tablet Take  162 mg by mouth daily.   10/01/2018 at Unknown time  . labetalol (NORMODYNE) 100 MG tablet Take 1 tablet (100 mg total) by mouth 2 (two) times daily. 30 tablet 1 10/02/2018  . prenatal vitamin w/FE, FA (PRENATAL 1 + 1) 27-1 MG TABS tablet Take 1 tablet by mouth daily at 12 noon. 30 each 12 10/02/2018 at Unknown time  . progesterone (PROMETRIUM) 200 MG capsule Place 1 capsule (200 mg total) vaginally at bedtime. Start @ 12wks of pregnancy (on 04/12/18) 30 capsule 5 10/01/2018 at Unknown time        Review of Systems   Constitutional: Negative for fever and chills Eyes: Negative for visual disturbances Respiratory: Negative for shortness of breath, dyspnea Cardiovascular: Negative for chest pain or palpitations  Gastrointestinal: Negative for abdominal pain, vomiting, diarrhea and constipation.   Genitourinary: Negative for dysuria and urgency Musculoskeletal: Negative for back pain, joint pain, myalgias  Neurological: Negative for dizziness and headaches      Blood pressure 132/77, pulse 95, temperature 97.9 F (36.6 C), temperature  source Oral, resp. rate 17, height 4\' 11"  (1.499 m), weight 90.7 kg, last menstrual period 11/28/2017, not currently breastfeeding. General appearance: alert, cooperative and no distress Lungs: clear to auscultation bilaterally Heart: regular rate and rhythm Abdomen: soft, non-tender; bowel sounds normal Extremities: Homans sign is negative, no sign of DVT DTR's 2+ Presentation: cephalic Fetal monitoring  Baseline: 140 bpm, Variability: Good {> 6 bpm), Accelerations: Reactive and Decelerations: Absent Uterine activity  None  Dilation: 2 Effacement (%): 50 Station: -2 Exam by:: Manus Gunning, CNM   FAMILY TREE  LAB RESULTS  Language English Pap 10/06/16 @ [place] : neg  Initiated care at 8wk GC/CT Initial: -/-           36wks:  Dating by 7wk U/S    Support person [place] NT/IT:  neg      AFP:         cfDNA:    [place]/HgbE neg  Flu vaccine 04/19/18 CF neg  TDaP vaccine 08/16/18  SMA   Rhogam       Blood Type A/Positive/-- (08/22 1620)  Anatomy US Normal female '[place]' Antibody Negative (08/22 1620)  Feeding Plan breast HBsAg Negative (08/22 1620)  Contraception Vasectomy RPR Non Reactive (08/22 1620)  Circumcision n/a Rubella  4.93 (08/22 1620)  Pediatrician [facility] HIV Non Reactive (08/22 1620)  Prenatal Classes Info given      GTT/A1C Early:      26-28wks: 78/162/128  BTL Consent 08/16/18  GBS        [ ]  PCN allergy  VBAC Consent n/a    Waterbirth [ ] Class [ ] Consent [ ] CNM visit PP Needs          Prenatal Transfer Tool  Maternal Diabetes: No Genetic Screening: Normal Maternal Ultrasounds/Referrals: Normal Fetal Ultrasounds or other Referrals:  None Maternal Substance Abuse:  No Significant Maternal Medications:  None Significant Maternal Lab Results: Lab values include: Group B Strep positive   EFW 60%   No results found for this or any previous visit (from the past 24 hour(s)).  Assessment: Tara Stark is a 38 y.o. G2P1001 with an IUP at [redacted]w[redacted]d  presenting for IOL for [facility] w/SIPE.  Plan: #Labor: Foley placed and inflated w/60cc H20.  Cx very soft, no cytotec needed>pitocin when foley falls out #Pain:  Per request #FWB Cat 1 #ID: GBS: PCN     Christin Fudge 10/04/2018, 1:03 AM

## 2018-10-04 NOTE — Progress Notes (Signed)
Vitals:   10/04/18 0759 10/04/18 0800  BP:  132/85  Pulse:  98  Resp: 20   Temp: 97.8 F (36.6 C)    Foley fell out around 0600, cx 4.5/50/-2. PItocin at 8 mu/min. Ctx irregular, mild.  FHR Cat 1.  Continue to increase pitocin.

## 2018-10-04 NOTE — Anesthesia Procedure Notes (Signed)
Epidural Patient location during procedure: OB Start time: 10/04/2018 12:09 PM End time: 10/04/2018 12:17 PM  Staffing Anesthesiologist: Lynda Rainwater, MD Performed: anesthesiologist   Preanesthetic Checklist Completed: patient identified, site marked, surgical consent, pre-op evaluation, timeout performed, IV checked, risks and benefits discussed and monitors and equipment checked  Epidural Patient position: sitting Prep: ChloraPrep Patient monitoring: heart rate, cardiac monitor, continuous pulse ox and blood pressure Approach: midline Location: L2-L3 Injection technique: LOR saline  Needle:  Needle type: Tuohy  Needle gauge: 17 G Needle length: 9 cm Needle insertion depth: 7 cm Catheter type: closed end flexible Catheter size: 20 Guage Catheter at skin depth: 12 cm Test dose: negative  Assessment Events: blood not aspirated, injection not painful, no injection resistance, negative IV test and no paresthesia  Additional Notes Reason for block:procedure for pain

## 2018-10-04 NOTE — Discharge Summary (Signed)
Postpartum Discharge Summary    Patient Name: Tara Stark DOB: 12/17/1980 MRN: [id number]  Date of admission: 10/04/2018 Delivering Provider: Aura Camps   Date of discharge: 10/06/2018  Intrauterine pregnancy: [redacted]w[redacted]d     Secondary diagnosis:  Active Problems:   Chronic hypertension with superimposed preeclampsia   Discharge diagnosis: Term Pregnancy Delivered                                                                                Post partum procedures:none Augmentation: AROM, Pitocin and Foley Balloon Complications: None  Hospital course:  Induction of Labor With Vaginal Delivery   38 y.o. yo G2P1001 at [redacted]w[redacted]d was admitted to the hospital 10/04/2018 for induction of labor.  Indication for induction: chronic HTN and superimposed Pre-eclampsia without severe features.  Patient had an uncomplicated labor course as follows: Membrane Rupture Time/Date: 10:05 AM ,10/04/2018   Intrapartum Procedures: Episiotomy: None [1]                                         Lacerations:  1st degree [2];Perineal [11]  Patient had delivery of a Viable infant.  Information for the patient's newborn:  Faron, Whitelock [[id number]]  Delivery Method: Vag-Spont   10/04/2018  Details of delivery can be found in separate delivery note.  Patient had a routine postpartum course. She had moderate range Bps prior to discharge, so she was continued on amlodipine 10mg  daily going home. Patient is discharged home 10/06/18.  Magnesium Sulfate recieved: No BMZ received: No  Physical exam  Vitals:   10/05/18 0934 10/05/18 1208 10/05/18 2152 10/06/18 0640  BP: 134/85 121/80 (!) 139/93 (!) 133/94  Pulse: 99 99 (!) 101 92  Resp: 18  20 19   Temp: 98.3 F (36.8 C)  98.5 F (36.9 C) 98.2 F (36.8 C)  TempSrc: Oral  Oral Oral  SpO2:   99% 99%  Weight:      Height:       General: alert, cooperative and no distress Lochia: appropriate Uterine Fundus: firm Incision: N/A DVT Evaluation: No cords or  calf tenderness. No significant calf/ankle edema. Labs: Lab Results  Component Value Date   WBC 10.5 10/04/2018   HGB 11.4 (L) 10/04/2018   HCT 34.8 (L) 10/04/2018   MCV 81.3 10/04/2018   PLT 204 10/04/2018   CMP Latest Ref Rng & Units 10/04/2018  Glucose 70 - 99 mg/dL 119(H)  BUN 6 - 20 mg/dL <5(L)  Creatinine 0.44 - 1.00 mg/dL 0.41(L)  Sodium 135 - 145 mmol/L 135  Potassium 3.5 - 5.1 mmol/L 4.5  Chloride 98 - 111 mmol/L 107  CO2 22 - 32 mmol/L 19(L)  Calcium 8.9 - 10.3 mg/dL 8.8(L)  Total Protein 6.5 - 8.1 g/dL 5.7(L)  Total Bilirubin 0.3 - 1.2 mg/dL 0.9  Alkaline Phos 38 - 126 U/L 139(H)  AST 15 - 41 U/L 29  ALT 0 - 44 U/L 11    Discharge instruction: per After Visit Summary and "Baby and Me Booklet".  After visit meds:  Allergies as of 10/06/2018   No Known Allergies  Medication List    STOP taking these medications   labetalol 100 MG tablet Commonly known as:  NORMODYNE   progesterone 200 MG capsule Commonly known as:  Prometrium     TAKE these medications   acetaminophen 500 MG tablet Commonly known as:  TYLENOL Take 1,000 mg by mouth as needed.   amLODipine 10 MG tablet Commonly known as:  NORVASC Take 1 tablet (10 mg total) by mouth daily.   aspirin EC 81 MG tablet Take 81 mg by mouth daily.   famotidine-calcium carbonate-magnesium hydroxide 10-800-165 MG chewable tablet Commonly known as:  PEPCID COMPLETE Chew 1 tablet by mouth 2 (two) times daily as needed (For heartburn.).   ibuprofen 600 MG tablet Commonly known as:  ADVIL,MOTRIN Take 1 tablet (600 mg total) by mouth every 6 (six) hours.   prenatal vitamin w/FE, FA 27-1 MG Tabs tablet Take 1 tablet by mouth daily at 12 noon.   senna-docusate 8.6-50 MG tablet Commonly known as:  Senokot-S Take 2 tablets by mouth daily. Start taking on:  October 07, 2018       Diet: routine diet Activity: Advance as tolerated. Pelvic rest for 6 weeks.   Outpatient follow up:1 week  Follow up  Appt: Future Appointments  Date Time Provider [place]  10/12/2018 11:00 AM [facility] NURSE [facility]   Follow up Visit: Follow-up Information    [facility]. Schedule an appointment as soon as possible for a visit.   Specialty:  Obstetrics and Gynecology Why:  You should receive a call to schedule a blood pressure check within the week and a postpartum visit in 4-6 weeks. If you do not, please call clinic to make these appointments.  Contact information: [address] [place] [phone]         Please schedule this patient for Postpartum visit in: 1 weeks with the following provider: Any provider For C/S patients schedule nurse incision check in weeks 2 weeks: no High risk pregnancy complicated by: HTN Delivery mode:  SVD Anticipated Birth Control:  other/unsure (vasectomy) PP Procedures needed: BP check  Schedule Integrated Blue visit: no  Newborn Data: Live born female  Birth Weight:   APGAR: 2,   Newborn Delivery   Time head delivered:  10/04/2018 19:01:00 Birth date/time:  10/04/2018 19:02:00 Delivery type:  Vaginal, Spontaneous    Baby Feeding: Breast Disposition:home with mother  10/06/2018 Glenice Bow, DO

## 2018-10-04 NOTE — Anesthesia Preprocedure Evaluation (Signed)
Anesthesia Evaluation  Patient identified by MRN, date of birth, ID band Patient awake    Reviewed: Allergy & Precautions, NPO status , Patient's Chart, lab work & pertinent test results  Airway Mallampati: II  TM Distance: >3 FB Neck ROM: Full    Dental no notable dental hx.    Pulmonary neg pulmonary ROS, former smoker,    Pulmonary exam normal breath sounds clear to auscultation       Cardiovascular hypertension, negative cardio ROS Normal cardiovascular exam Rhythm:Regular Rate:Normal     Neuro/Psych negative neurological ROS  negative psych ROS   GI/Hepatic negative GI ROS, Neg liver ROS,   Endo/Other  negative endocrine ROS  Renal/GU negative Renal ROS  negative genitourinary   Musculoskeletal negative musculoskeletal ROS (+)   Abdominal (+) + obese,   Peds negative pediatric ROS (+)  Hematology negative hematology ROS (+)   Anesthesia Other Findings   Reproductive/Obstetrics negative OB ROS (+) Pregnancy                             Anesthesia Physical Anesthesia Plan  ASA: II  Anesthesia Plan: Epidural   Post-op Pain Management:    Induction:   PONV Risk Score and Plan:   Airway Management Planned:   Additional Equipment:   Intra-op Plan:   Post-operative Plan:   Informed Consent:   Plan Discussed with:   Anesthesia Plan Comments:         Anesthesia Quick Evaluation

## 2018-10-04 NOTE — Discharge Instructions (Signed)
Vaginal Delivery, Care After °Refer to this sheet in the next few weeks. These instructions provide you with information about caring for yourself after vaginal delivery. Your health care provider may also give you more specific instructions. Your treatment has been planned according to current medical practices, but problems sometimes occur. Call your health care provider if you have any problems or questions. °What can I expect after the procedure? °After vaginal delivery, it is common to have: °· Some bleeding from your vagina. °· Soreness in your abdomen, your vagina, and the area of skin between your vaginal opening and your anus (perineum). °· Pelvic cramps. °· Fatigue. °Follow these instructions at home: °Medicines °· Take over-the-counter and prescription medicines only as told by your health care provider. °· If you were prescribed an antibiotic medicine, take it as told by your health care provider. Do not stop taking the antibiotic until it is finished. °Driving ° °· Do not drive or operate heavy machinery while taking prescription pain medicine. °· Do not drive for 24 hours if you received a sedative. °Lifestyle °· Do not drink alcohol. This is especially important if you are breastfeeding or taking medicine to relieve pain. °· Do not use tobacco products, including cigarettes, chewing tobacco, or e-cigarettes. If you need help quitting, ask your health care provider. °Eating and drinking °· Drink at least 8 eight-ounce glasses of water every day unless you are told not to by your health care provider. If you choose to breastfeed your baby, you may need to drink more water than this. °· Eat high-fiber foods every day. These foods may help prevent or relieve constipation. High-fiber foods include: °? Whole grain cereals and breads. °? Brown rice. °? Beans. °? Fresh fruits and vegetables. °Activity °· Return to your normal activities as told by your health care provider. Ask your health care provider what  activities are safe for you. °· Rest as much as possible. Try to rest or take a nap when your baby is sleeping. °· Do not lift anything that is heavier than your baby or 10 lb (4.5 kg) until your health care provider says that it is safe. °· Talk with your health care provider about when you can engage in sexual activity. This may depend on your: °? Risk of infection. °? Rate of healing. °? Comfort and desire to engage in sexual activity. °Vaginal Care °· If you have an episiotomy or a vaginal tear, check the area every day for signs of infection. Check for: °? More redness, swelling, or pain. °? More fluid or blood. °? Warmth. °? Pus or a bad smell. °· Do not use tampons or douches until your health care provider says this is safe. °· Watch for any blood clots that may pass from your vagina. These may look like clumps of dark red, brown, or black discharge. °General instructions °· Keep your perineum clean and dry as told by your health care provider. °· Wear loose, comfortable clothing. °· Wipe from front to back when you use the toilet. °· Ask your health care provider if you can shower or take a bath. If you had an episiotomy or a perineal tear during labor and delivery, your health care provider may tell you not to take baths for a certain length of time. °· Wear a bra that supports your breasts and fits you well. °· If possible, have someone help you with household activities and help care for your baby for at least a few days after you   leave the hospital. °· Keep all follow-up visits for you and your baby as told by your health care provider. This is important. °Contact a health care provider if: °· You have: °? Vaginal discharge that has a bad smell. °? Difficulty urinating. °? Pain when urinating. °? A sudden increase or decrease in the frequency of your bowel movements. °? More redness, swelling, or pain around your episiotomy or vaginal tear. °? More fluid or blood coming from your episiotomy or vaginal  tear. °? Pus or a bad smell coming from your episiotomy or vaginal tear. °? A fever. °? A rash. °? Little or no interest in activities you used to enjoy. °? Questions about caring for yourself or your baby. °· Your episiotomy or vaginal tear feels warm to the touch. °· Your episiotomy or vaginal tear is separating or does not appear to be healing. °· Your breasts are painful, hard, or turn red. °· You feel unusually sad or worried. °· You feel nauseous or you vomit. °· You pass large blood clots from your vagina. If you pass a blood clot from your vagina, save it to show to your health care provider. Do not flush blood clots down the toilet without having your health care provider look at them. °· You urinate more than usual. °· You are dizzy or light-headed. °· You have not breastfed at all and you have not had a menstrual period for 12 weeks after delivery. °· You have stopped breastfeeding and you have not had a menstrual period for 12 weeks after you stopped breastfeeding. °Get help right away if: °· You have: °? Pain that does not go away or does not get better with medicine. °? Chest pain. °? Difficulty breathing. °? Blurred vision or spots in your vision. °? Thoughts about hurting yourself or your baby. °· You develop pain in your abdomen or in one of your legs. °· You develop a severe headache. °· You faint. °· You bleed from your vagina so much that you fill two sanitary pads in one hour. °This information is not intended to replace advice given to you by your health care provider. Make sure you discuss any questions you have with your health care provider. °Document Released: 07/10/2000 Document Revised: 12/25/2015 Document Reviewed: 07/28/2015 °Elsevier Interactive Patient Education © 2019 [employer] ° °

## 2018-10-04 NOTE — Progress Notes (Signed)
Labor Progress Note Tara Stark is a 38 y.o. G2P1001 at [redacted]w[redacted]d presented for IOL for cHTN/SIPE  Subjective: Patient is tolerating pain well with epidural.   Objective:  BP 122/73   Pulse 100   Temp 98.3 F (36.8 C) (Oral)   Resp 18   Ht 4\' 11"  (1.499 m)   Wt 90.7 kg   LMP 11/28/2017   SpO2 99%   BMI 40.40 kg/m   CVE: Dilation: 9 Effacement (%): 90, 100 Cervical Position: Anterior Station: 0 Presentation: Vertex Exam by:: Dr. Doren Custard   A&P: 39 y.o. G2P1001 [redacted]w[redacted]d here for IOL for cHTN/SIPE  #Labor: Progressing quickly.  #Pain: Well tolerated with epidural. #FWB: Cat 2 (reassuring)  #GBS positive (received PCN)  Anticipated MOD: SVD  Shawnie Dapper, Student-PA 2:54 PM

## 2018-10-05 LAB — RPR: RPR Ser Ql: NONREACTIVE

## 2018-10-05 MED ORDER — AMLODIPINE BESYLATE 5 MG PO TABS
10.0000 mg | ORAL_TABLET | Freq: Every day | ORAL | Status: DC
  Administered 2018-10-05 – 2018-10-06 (×2): 10 mg via ORAL
  Filled 2018-10-05 (×2): qty 2

## 2018-10-05 NOTE — Anesthesia Postprocedure Evaluation (Signed)
Anesthesia Post Note  Patient: Tara Stark  Procedure(s) Performed: AN AD Cassia     Patient location during evaluation: Mother Baby Anesthesia Type: Epidural Level of consciousness: awake and alert Pain management: pain level controlled Vital Signs Assessment: post-procedure vital signs reviewed and stable Respiratory status: spontaneous breathing, nonlabored ventilation and respiratory function stable Cardiovascular status: stable Postop Assessment: no headache, no backache, epidural receding, no apparent nausea or vomiting, patient able to bend at knees, able to ambulate and adequate PO intake Anesthetic complications: no    Last Vitals:  Vitals:   10/05/18 0240 10/05/18 0553  BP: 130/89 116/72  Pulse: 93 (!) 102  Resp: 20 18  Temp: 36.9 C 37.1 C  SpO2:      Last Pain:  Vitals:   10/05/18 0553  TempSrc: Oral  PainSc: 0-No pain   Pain Goal:                   [employer]

## 2018-10-05 NOTE — Progress Notes (Signed)
Post Partum Day 1 Subjective: No complaints, up ad lib, voiding, tolerating PO and + flatus.   Objective: Blood pressure 116/72, pulse (!) 102, temperature 98.7 F (37.1 C), temperature source Oral, resp. rate 18, height 4\' 11"  (1.499 m), weight 90.7 kg, last menstrual period 11/28/2017, SpO2 98 %, unknown if currently breastfeeding.  Physical Exam:  General: alert, cooperative and no distress Lochia: appropriate Uterine Fundus: firm DVT Evaluation: No evidence of DVT seen on physical exam. Negative Homan's sign. No cords or calf tenderness.  Recent Labs    10/04/18 0140 10/04/18 1029  HGB 11.4* 11.4*  HCT 35.0* 34.8*    Assessment/Plan: Contraception: husbands plans on scheduling a vasectomy.   LOS: 1 day   Shawnie Dapper PA-S2 10/05/2018, 7:25 AM

## 2018-10-05 NOTE — Lactation Note (Signed)
This note was copied from a baby's chart. Lactation Consultation Note Mom states baby has latched well w/o pain.  Mom has soft pendulous breast. Nipples short shaft at rest, everts hard w/stimulation. Areola and nipple tissue feels tough. Gave mom shells and hand pump. encouraged mom to pre-pump prior to latching. Gave mom pump and she pumped for stimulation. Mom demonstrated hand expression of squeezing areola and nipple. LC demonstrated proper hand expression. Mom has 13 yr old that she breast/formula for 2 weeks. Mom plans to breast/formula this baby as well. Encouraged to BF for 2 weeks before giving formula. Newborn behavior, STS, I&O, supply and demand discussed. encouraged to call for assistance if needed.  Lactation brochure left at bedside.  Patient Name: Girl Nuvia Hileman YBWLS'L Date: 10/05/2018 Reason for consult: Initial assessment;Early term 38-38.6wks   Maternal Data Has patient been taught Hand Expression?: Yes Does the patient have breastfeeding experience prior to this delivery?: Yes  Feeding Feeding Type: Breast Fed  LATCH Score Latch: Repeated attempts needed to sustain latch, nipple held in mouth throughout feeding, stimulation needed to elicit sucking reflex.  Audible Swallowing: None  Type of Nipple: Flat  Comfort (Breast/Nipple): Soft / non-tender  Hold (Positioning): Assistance needed to correctly position infant at breast and maintain latch.  LATCH Score: 6  Interventions Interventions: Breast feeding basics reviewed;Hand express;Pre-pump if needed;Shells;Breast compression;Hand pump  Lactation Tools Discussed/Used Tools: Shells;Pump Shell Type: Inverted Breast pump type: Manual WIC Program: Yes Pump Review: Setup, frequency, and cleaning Initiated by:: Allayne Stack RN IBCLC Date initiated:: 10/05/18   Consult Status Consult Status: Follow-up Date: 10/06/18 Follow-up type: In-patient    Theodoro Kalata 10/05/2018, 5:40 AM

## 2018-10-06 MED ORDER — IBUPROFEN 600 MG PO TABS: 600.0000 mg | ORAL_TABLET | Freq: Four times a day (QID) | ORAL | 0 refills | Status: DC

## 2018-10-06 MED ORDER — SENNOSIDES-DOCUSATE SODIUM 8.6-50 MG PO TABS: 2.0000 | ORAL_TABLET | ORAL | 0 refills | Status: DC

## 2018-10-06 NOTE — Lactation Note (Signed)
This note was copied from a baby's chart. Lactation Consultation Note  Patient Name: Tara Stark EKCMK'L Date: 10/06/2018 Reason for consult: Follow-up assessment;Early term 75-38.6wks  P2 mother whose infant is now 33 hours old.  Mother breast fed her first child for 2 weeks.  Baby was sleeping in bassinet when I arrived.  Mother had no questions/concerns related to breast feeding.  Her breasts are soft and non tender and nipples are intact.  Engorgement prevention/treatment discussed.  Mother has a manual pump at bedside.  She does not have a DEBP but does not feel the need for one at this time.  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  She has our OP phone number for questions/concerns after discharge.  Encouraged her to call as needed.     Maternal Data Formula Feeding for Exclusion: No Has patient been taught Hand Expression?: Yes Does the patient have breastfeeding experience prior to this delivery?: Yes  Feeding    LATCH Score                   Interventions    Lactation Tools Discussed/Used WIC Program: Yes   Consult Status Consult Status: Complete Date: 09/29/18 Follow-up type: Call as needed    Tara Stark [name] 10/06/2018, 10:26 AM

## 2018-10-07 ENCOUNTER — Ambulatory Visit: Payer: Self-pay

## 2018-10-07 ENCOUNTER — Other Ambulatory Visit: Payer: Self-pay | Admitting: Advanced Practice Midwife

## 2018-10-07 NOTE — Lactation Note (Signed)
This note was copied from a baby's chart. Lactation Consultation Note  Patient Name: Girl Renatha Rosen XIPPN'D Date: 10/07/2018 Reason for consult: Follow-up assessment Mom continues to both breast and bottle feed baby.  Baby remains on phototherapy.  Mom states baby is latching well and she has no concerns.  Discussed milk coming to volume and the prevention and treatment of engorgement.  Mom has a DEBP for home use.  Lactation outpatient services and support reviewed and encouraged prn.  Maternal Data    Feeding    LATCH Score                   Interventions    Lactation Tools Discussed/Used     Consult Status Consult Status: Complete Follow-up type: Call as needed    Ave Filter 10/07/2018, 9:46 AM

## 2018-10-08 ENCOUNTER — Ambulatory Visit: Payer: Self-pay

## 2018-10-08 NOTE — Lactation Note (Signed)
This note was copied from a baby's chart. Lactation Consultation Note  Patient Name: Tara Stark BJSEG'B Date: 10/08/2018 Reason for consult: Hyperbilirubinemia;Follow-up assessment;Early term 37-38.6wks   1151 - 1216 - I visited [name] to check on her progress with breast feeding her early term daughter. She was changing a dirty diaper upon entry. She states that baby "Tara Stark" has been latching with good suckling sequences. She has been sleepy and has needed extra stimulation and waking measures to stay awake at the breast. Baby is no longer on phototherapy, and mom plans to go home today.  I observed mom put baby to the breast and offered to assist when needed. Mom is hand expressing transitional milk. She is diligent about expressing milk into baby's mouth; however, baby is still quite sleepy and suckles a few times and then releases. We spent a while working on waking baby and adjusting positions to elicit a suck reflex.  Mom is supplementing 30 cc's of ABM upon pediatrician's request. She has not been using a DEBP. She has a DEBP from [facility] at home. I recommended that she pump both breasts with this or with her hand pump for 10-15 minutes each side several times each day until baby is more alert and organized at the breast. She can feed back her expressed milk.  Mom states that baby's output is good. She will follow up with her pediatrician on Monday, and she will contact [facility] for follow up as needed.   Maternal Data Has patient been taught Hand Expression?: Yes Does the patient have breastfeeding experience prior to this delivery?: Yes  Feeding Feeding Type: Breast Fed  LATCH Score Latch: Repeated attempts needed to sustain latch, nipple held in mouth throughout feeding, stimulation needed to elicit sucking reflex.  Audible Swallowing: A few with stimulation  Type of Nipple: Flat  Comfort (Breast/Nipple): Soft / non-tender  Hold (Positioning): No  assistance needed to correctly position infant at breast.  LATCH Score: 7  Interventions Interventions: Assisted with latch;Hand express;Breast compression;Expressed milk;Position options;Support pillows;Adjust position  Lactation Tools Discussed/Used WIC Program: Yes Pump Review: Other (comment)   Consult Status Consult Status: Complete Follow-up type: Call as needed    Lenore Manner 10/08/2018, 12:24 PM

## 2018-10-09 ENCOUNTER — Ambulatory Visit: Payer: Self-pay

## 2018-10-09 NOTE — Lactation Note (Signed)
This note was copied from a baby's chart. Lactation Consultation Note  Patient Name: Girl Sharell Hilmer XFPKG'Y Date: 10/09/2018 Reason for consult: Follow-up assessment;Hyperbilirubinemia Baby is 5 days/3% weight loss.  Phototherapy was discontinued this morning and bilirubin to be drawn at 1500.  Mom is both putting baby to breast and pumping/bottle feeding.  She is obtaining 60 mls each pumping and baby is tolerating well.  Mom states breasts are comfortable.  She has a manual pump and plans to go to [facility] tomorrow for DEBP. Encouraged to call for assist/cocnerns.  Maternal Data    Feeding    LATCH Score                   Interventions    Lactation Tools Discussed/Used     Consult Status Consult Status: Complete Follow-up type: Call as needed    Ave Filter 10/09/2018, 12:18 PM

## 2018-10-11 ENCOUNTER — Other Ambulatory Visit: Payer: Self-pay

## 2018-10-11 ENCOUNTER — Encounter: Payer: Self-pay | Admitting: Advanced Practice Midwife

## 2018-10-12 ENCOUNTER — Telehealth: Payer: Self-pay | Admitting: Obstetrics & Gynecology

## 2018-10-12 ENCOUNTER — Other Ambulatory Visit: Payer: Self-pay | Admitting: Women's Health

## 2018-10-12 ENCOUNTER — Ambulatory Visit: Payer: Medicaid Other | Admitting: *Deleted

## 2018-10-12 ENCOUNTER — Encounter: Payer: Self-pay | Admitting: *Deleted

## 2018-10-12 ENCOUNTER — Other Ambulatory Visit: Payer: Self-pay

## 2018-10-12 VITALS — BP 140/92 | HR 92 | Wt 184.0 lb

## 2018-10-12 DIAGNOSIS — Z0130 Encounter for examination of blood pressure without abnormal findings: Secondary | ICD-10-CM

## 2018-10-12 MED ORDER — HYDROCHLOROTHIAZIDE 25 MG PO TABS: 25.0000 mg | ORAL_TABLET | Freq: Every day | ORAL | 0 refills | Status: DC

## 2018-10-12 NOTE — Telephone Encounter (Signed)
Patient called stating that she came into the office today and she was suppose to have a medication called in. Please contact pt

## 2018-10-12 NOTE — Progress Notes (Signed)
Discussed BP with KR, will add HCTZ 25mg  daily along with Norvasc. Informed it may decrease milk supply so will send tips via mychart.  Also advised to stop taking HCTZ 2 days prior to PP appt.  Verbalized understanding.

## 2018-10-14 ENCOUNTER — Other Ambulatory Visit: Payer: Self-pay | Admitting: Obstetrics & Gynecology

## 2018-10-18 ENCOUNTER — Encounter: Payer: Self-pay | Admitting: Obstetrics & Gynecology

## 2018-10-18 ENCOUNTER — Other Ambulatory Visit: Payer: Self-pay

## 2018-10-21 ENCOUNTER — Other Ambulatory Visit: Payer: Self-pay | Admitting: Obstetrics & Gynecology

## 2018-11-01 ENCOUNTER — Telehealth: Payer: Self-pay | Admitting: *Deleted

## 2018-11-01 NOTE — Telephone Encounter (Signed)
LMOVM that we are not allowing visitors or children to come to appointments at this time. Advised to call if has had any contact with anyone suspected or confirmed of having COVID-19; or if has fever, cough, sob, muscle pain, diarrhea, rash, vomiting, abdominal pain, red eye, weakness, bruising or bleeding, joint pain or severe headache.

## 2018-11-02 ENCOUNTER — Other Ambulatory Visit: Payer: Self-pay

## 2018-11-02 ENCOUNTER — Encounter: Payer: Self-pay | Admitting: Advanced Practice Midwife

## 2018-11-02 ENCOUNTER — Ambulatory Visit (INDEPENDENT_AMBULATORY_CARE_PROVIDER_SITE_OTHER): Payer: Medicaid Other | Admitting: Advanced Practice Midwife

## 2018-11-02 DIAGNOSIS — Z3202 Encounter for pregnancy test, result negative: Secondary | ICD-10-CM | POA: Diagnosis not present

## 2018-11-02 DIAGNOSIS — Z1389 Encounter for screening for other disorder: Secondary | ICD-10-CM

## 2018-11-02 LAB — POCT URINE PREGNANCY: Preg Test, Ur: NEGATIVE

## 2018-11-02 MED ORDER — NORETHINDRONE 0.35 MG PO TABS: ORAL_TABLET | ORAL | 11 refills | Status: DC

## 2018-11-02 NOTE — Patient Instructions (Signed)

## 2018-11-02 NOTE — Progress Notes (Signed)
Tara Stark is a 38 y.o. who presents for a postpartum visit. She is 4 weeks postpartum following a spontaneous vaginal delivery. I have fully reviewed the prenatal and intrapartum course. The delivery was at 70 gestational weeks.  IOL for [facility] w/SIPE  Anesthesia: epidural. Postpartum course has been uneventful. Was [facility] home on norvasc 10mg  and HCTZ 25mg  added at 1 week F/U visit. Last dose of HCTZ  2 days ago.. Baby's course has been uneventful. Baby is feeding by breast. Bleeding: no bleeding. Bowel function is normal. Bladder function is normal. Patient is not sexually active. Contraception method is abstinence. Postpartum depression screening: negative.  Has had some vulvar itching for a while, wondering if it could be GBS.    Current Outpatient Medications:  .  acetaminophen (TYLENOL) 500 MG tablet, Take 1,000 mg by mouth as needed., Disp: , Rfl:  .  amLODipine (NORVASC) 10 MG tablet, Take 1 tablet (10 mg total) by mouth daily., Disp: 30 tablet, Rfl: 12 .  famotidine-calcium carbonate-magnesium hydroxide (PEPCID COMPLETE) 10-800-165 MG chewable tablet, Chew 1 tablet by mouth 2 (two) times daily as needed (For heartburn.)., Disp: , Rfl:  .  prenatal vitamin w/FE, FA (PRENATAL 1 + 1) 27-1 MG TABS tablet, Take 1 tablet by mouth daily at 12 noon., Disp: 30 each, Rfl: 12 .  senna-docusate (SENOKOT-S) 8.6-50 MG tablet, Take 2 tablets by mouth daily., Disp: 30 tablet, Rfl: 0 .  hydrochlorothiazide (HYDRODIURIL) 25 MG tablet, Take 1 tablet (25 mg total) by mouth daily., Disp: 30 tablet, Rfl: 0 .  norethindrone (MICRONOR,CAMILA,ERRIN) 0.35 MG tablet, Start on the Sunday after baby turns 66 weeks old, Disp: 1 Package, Rfl: 11  Review of Systems   Constitutional: Negative for fever and chills Eyes: Negative for visual disturbances Respiratory: Negative for shortness of breath, dyspnea Cardiovascular: Negative for chest pain or palpitations  Gastrointestinal: Negative for vomiting, diarrhea and  constipation Genitourinary: Negative for dysuria and urgency Musculoskeletal: Negative for back pain, joint pain, myalgias  Neurological: Negative for dizziness and headaches    Objective:     Vitals:   11/02/18 1029  BP: 137/84  Pulse: 97  Temp: 98.8 F (37.1 C)   General:  alert, cooperative and no distress   Breasts:  negative  Lungs: Normal respiratory effort  Heart:  regular rate and rhythm  Abdomen: Soft, nontender   Vulva:  normal  Vagina: normal vagina.  Tiny palpable (not visible) furuncle on right labia.  No rash/discoloration  Cervix:  closed  Corpus: Well involuted     Rectal Exam:   hemorrhoids        Assessment:    normal postpartum exam.  Plan:   1. Contraception: oral progesterone-only contraceptive.  Start today/tomorrow and use BU for 2 weeks Non specific vulvar itch, no evidence of infection  Keep area dry. Use pantyliners until lochia is gone  2. Follow up as needed.

## 2018-11-10 ENCOUNTER — Telehealth: Payer: Self-pay | Admitting: Obstetrics & Gynecology

## 2018-11-10 NOTE — Telephone Encounter (Signed)
Spoke with pt. Pt has missed 3 of her birth control pills and wants to know how to get back on track. Pt is not having sex right now. I spoke with Maudie Mercury, CNM and she advised no need to double up. Can start today and take one daily. If she has sex, use condoms x 2 weeks for backup. Pt voiced understanding. [place]

## 2018-11-10 NOTE — Telephone Encounter (Signed)
Patient called, stated that she was supposed to start her bc on Sunday, she started it Monday and has already missed two days.  She wants to know what she needs to do about getting on track.  [phone]

## 2019-01-31 ENCOUNTER — Other Ambulatory Visit (HOSPITAL_COMMUNITY): Payer: Self-pay | Admitting: Nurse Practitioner

## 2019-01-31 ENCOUNTER — Other Ambulatory Visit: Payer: Self-pay | Admitting: Nurse Practitioner

## 2019-01-31 DIAGNOSIS — L292 Pruritus vulvae: Secondary | ICD-10-CM | POA: Diagnosis not present

## 2019-01-31 DIAGNOSIS — Z0001 Encounter for general adult medical examination with abnormal findings: Secondary | ICD-10-CM | POA: Diagnosis not present

## 2019-01-31 DIAGNOSIS — Z13220 Encounter for screening for lipoid disorders: Secondary | ICD-10-CM | POA: Diagnosis not present

## 2019-01-31 DIAGNOSIS — I10 Essential (primary) hypertension: Secondary | ICD-10-CM | POA: Diagnosis not present

## 2019-01-31 DIAGNOSIS — R55 Syncope and collapse: Secondary | ICD-10-CM

## 2019-01-31 DIAGNOSIS — R5383 Other fatigue: Secondary | ICD-10-CM | POA: Diagnosis not present

## 2019-01-31 DIAGNOSIS — Z113 Encounter for screening for infections with a predominantly sexual mode of transmission: Secondary | ICD-10-CM | POA: Diagnosis not present

## 2019-01-31 DIAGNOSIS — Z131 Encounter for screening for diabetes mellitus: Secondary | ICD-10-CM | POA: Diagnosis not present

## 2019-01-31 DIAGNOSIS — Z139 Encounter for screening, unspecified: Secondary | ICD-10-CM | POA: Diagnosis not present

## 2019-02-10 ENCOUNTER — Ambulatory Visit (HOSPITAL_COMMUNITY): Payer: Medicaid Other

## 2019-02-10 ENCOUNTER — Other Ambulatory Visit (HOSPITAL_COMMUNITY): Payer: Self-pay | Admitting: Nurse Practitioner

## 2019-02-10 DIAGNOSIS — R55 Syncope and collapse: Secondary | ICD-10-CM

## 2019-02-21 ENCOUNTER — Encounter (HOSPITAL_COMMUNITY): Payer: Self-pay

## 2019-02-21 ENCOUNTER — Ambulatory Visit (HOSPITAL_COMMUNITY): Payer: Medicaid Other

## 2019-03-06 ENCOUNTER — Other Ambulatory Visit: Payer: Self-pay | Admitting: Adult Health

## 2019-03-09 ENCOUNTER — Ambulatory Visit (HOSPITAL_COMMUNITY): Admission: RE | Admit: 2019-03-09 | Payer: Medicaid Other | Source: Ambulatory Visit

## 2019-03-09 ENCOUNTER — Encounter (HOSPITAL_COMMUNITY): Payer: Self-pay

## 2019-03-28 DIAGNOSIS — O26849 Uterine size-date discrepancy, unspecified trimester: Secondary | ICD-10-CM | POA: Diagnosis not present

## 2019-03-28 DIAGNOSIS — Z3689 Encounter for other specified antenatal screening: Secondary | ICD-10-CM | POA: Diagnosis not present

## 2019-03-28 DIAGNOSIS — Z331 Pregnant state, incidental: Secondary | ICD-10-CM | POA: Diagnosis not present

## 2019-03-28 DIAGNOSIS — Z3A01 Less than 8 weeks gestation of pregnancy: Secondary | ICD-10-CM | POA: Diagnosis not present

## 2019-03-28 DIAGNOSIS — O09521 Supervision of elderly multigravida, first trimester: Secondary | ICD-10-CM | POA: Diagnosis not present

## 2019-03-28 DIAGNOSIS — N912 Amenorrhea, unspecified: Secondary | ICD-10-CM | POA: Diagnosis not present

## 2019-03-28 DIAGNOSIS — Z3201 Encounter for pregnancy test, result positive: Secondary | ICD-10-CM | POA: Diagnosis not present

## 2019-03-28 LAB — OB RESULTS CONSOLE ABO/RH: RH Type: POSITIVE

## 2019-03-28 LAB — OB RESULTS CONSOLE RUBELLA ANTIBODY, IGM: Rubella: IMMUNE

## 2019-03-28 LAB — OB RESULTS CONSOLE ANTIBODY SCREEN: Antibody Screen: NEGATIVE

## 2019-03-28 LAB — OB RESULTS CONSOLE RPR: RPR: NONREACTIVE

## 2019-03-28 LAB — OB RESULTS CONSOLE HEPATITIS B SURFACE ANTIGEN: Hepatitis B Surface Ag: NEGATIVE

## 2019-03-28 LAB — OB RESULTS CONSOLE GC/CHLAMYDIA
Chlamydia: NEGATIVE
Gonorrhea: NEGATIVE

## 2019-03-28 LAB — OB RESULTS CONSOLE HIV ANTIBODY (ROUTINE TESTING): HIV: NONREACTIVE

## 2019-03-30 NOTE — Progress Notes (Signed)
Cardiology Office Note   Date:  04/18/2019   ID:  Tara Stark, DOB 1981/01/10, MRN [id number]  PCP:  Doree Albee, MD  Cardiologist:   Dorris Carnes, MD     History of Present Illness: Tara Stark is a 38 y.o. female with a history of who is referred by  For evaluation of dizziness    Talking to the pt she has a history of head spinning    NOt dizziness with prolonged standing nor feeling like going to faint   Denies syncope    Has had intermittently     Not every day  The pt is currently about [redacted] wks pregnant  She had 2 previous deliveries, one complicated by preeclampsia, other complicataed by HTN     Current Meds  Medication Sig  . acetaminophen (TYLENOL) 500 MG tablet Take 1,000 mg by mouth as needed.  . senna-docusate (SENOKOT-S) 8.6-50 MG tablet Take 2 tablets by mouth daily.  . [DISCONTINUED] amLODipine (NORVASC) 10 MG tablet TAKE (1) TABLET BY MOUTH ONCE DAILY.  . [DISCONTINUED] prenatal vitamin w/FE, FA (PRENATAL 1 + 1) 27-1 MG TABS tablet Take 1 tablet by mouth daily at 12 noon.     Allergies:   Patient has no known allergies.   Past Medical History:  Diagnosis Date  . Acid indigestion   . Fibroid   . Hemorrhoids   . Hypertension   . Thyroid disease   . Vitamin D deficiency disease 04/18/2019    Past Surgical History:  Procedure Laterality Date  . NO PAST SURGERIES       Social History:  The patient  reports that she has quit smoking. Her smoking use included cigarettes. She quit after 4.00 years of use. She has never used smokeless tobacco. She reports that she does not drink alcohol or use drugs.   Family History:  The patient's family history includes Alzheimer's disease in her maternal grandmother; Breast cancer in her paternal grandmother; Bronchiolitis in her son; Cancer in her maternal grandfather; Diabetes in her father; Hypertension in her brother, mother, paternal aunt, sister, and sister; Other in her father and paternal grandfather.     ROS:  Please see the history of present illness. All other systems are reviewed and  Negative to the above problem except as noted.    PHYSICAL EXAM: VS:  BP 135/82   Pulse 92   Temp (!) 96.9 F (36.1 C) (Temporal)   Ht 4\' 11"  (1.499 m)   Wt 189 lb (85.7 kg)   SpO2 98%   BMI 38.17 kg/m    Orthostatic BP /P  Are negative   GEN: Morbidly obese 38 yo in no acute distress  HEENT: normal  Neck: no JVD, carotid bruits, or masses Cardiac: RRR; no murmurs, rubs, or gallops,no edema  Respiratory:  clear to auscultation bilaterally, normal work of breathing GI: soft, nontender, nondistended, + BS  No hepatomegaly  MS: no deformity Moving all extremities   Skin: warm and dry, no rash Neuro:  Strength and sensation are intact Psych: euthymic mood, full affect   EKG:  EKG is not ordered today.  On 01/31/19:  SR 94 bpm     Lipid Panel No results found for: CHOL, TRIG, HDL, CHOLHDL, VLDL, LDLCALC, LDLDIRECT    Wt Readings from Last 3 Encounters:  04/18/19 187 lb 12.8 oz (85.2 kg)  03/31/19 189 lb (85.7 kg)  11/02/18 182 lb (82.6 kg)      ASSESSMENT AND PLAN:  1.  "  Dizziness"   The pt does not give a history that sugg orthostasis   More a swimmy feeling in head   She has not had syncope     On exam, she is not orthostatic I would follow   She has warning signs   Stay hydrated   2  Hx preeclampsia and HTN with pregnancy   WIll need to be followed closely with this pregnancy     Current medicines are reviewed at length with the patient today.  The patient does not have concerns regarding medicines.  Signed, Dorris Carnes, MD  04/18/2019 11:10 PM    [facility] [address], [place], [place]  [address] Phone: [phone]; Fax: [phone]

## 2019-03-31 ENCOUNTER — Encounter: Payer: Self-pay | Admitting: Internal Medicine

## 2019-03-31 ENCOUNTER — Other Ambulatory Visit: Payer: Self-pay

## 2019-03-31 ENCOUNTER — Ambulatory Visit (INDEPENDENT_AMBULATORY_CARE_PROVIDER_SITE_OTHER): Payer: Medicaid Other | Admitting: Internal Medicine

## 2019-03-31 VITALS — BP 135/82 | HR 92 | Temp 96.9°F | Ht 59.0 in | Wt 189.0 lb

## 2019-03-31 DIAGNOSIS — R42 Dizziness and giddiness: Secondary | ICD-10-CM | POA: Diagnosis not present

## 2019-03-31 MED ORDER — PROPRANOLOL HCL 20 MG PO TABS: 20.0000 mg | ORAL_TABLET | Freq: Two times a day (BID) | ORAL | 3 refills | Status: DC

## 2019-03-31 NOTE — Patient Instructions (Signed)
Medication Instructions:  Your physician has recommended you make the following change in your medication:  Stop Taking  Start Taking Inderal 20 mg Two Times Daily   If you need a refill on your cardiac medications before your next appointment, please call your pharmacy.   Lab work: NONE   If you have labs (blood work) drawn today and your tests are completely normal, you will receive your results only by: Marland Kitchen MyChart Message (if you have MyChart) OR . A paper copy in the mail If you have any lab test that is abnormal or we need to change your treatment, we will call you to review the results.  Testing/Procedures: NONE   Follow-Up: At [facility], you and your health needs are our priority.  As part of our continuing mission to provide you with exceptional heart care, we have created designated Provider Care Teams.  These Care Teams include your primary Cardiologist (physician) and Advanced Practice Providers (APPs -  Physician Assistants and Nurse Practitioners) who all work together to provide you with the care you need, when you need it. You will need a follow up appointment To Be Determined.  Please call our office 2 months in advance to schedule this appointment.  You may see Dr. Harrington Challenger. or one of the following Advanced Practice Providers on your designated Care Team:   [place], PA-C [facility]) . Ermalinda Barrios, PA-C ([facility])  Any Other Special Instructions Will Be Listed Below (If Applicable). Thank you for choosing [facility]!

## 2019-04-11 ENCOUNTER — Ambulatory Visit (INDEPENDENT_AMBULATORY_CARE_PROVIDER_SITE_OTHER): Payer: Medicaid Other | Admitting: Internal Medicine

## 2019-04-18 ENCOUNTER — Ambulatory Visit (INDEPENDENT_AMBULATORY_CARE_PROVIDER_SITE_OTHER): Payer: Medicaid Other | Admitting: Internal Medicine

## 2019-04-18 ENCOUNTER — Encounter (INDEPENDENT_AMBULATORY_CARE_PROVIDER_SITE_OTHER): Payer: Self-pay | Admitting: Internal Medicine

## 2019-04-18 ENCOUNTER — Other Ambulatory Visit: Payer: Self-pay | Admitting: Adult Health

## 2019-04-18 ENCOUNTER — Other Ambulatory Visit: Payer: Self-pay

## 2019-04-18 VITALS — BP 150/90 | HR 72 | Ht 60.0 in | Wt 187.8 lb

## 2019-04-18 DIAGNOSIS — R5381 Other malaise: Secondary | ICD-10-CM

## 2019-04-18 DIAGNOSIS — R42 Dizziness and giddiness: Secondary | ICD-10-CM

## 2019-04-18 DIAGNOSIS — R5383 Other fatigue: Secondary | ICD-10-CM

## 2019-04-18 DIAGNOSIS — E559 Vitamin D deficiency, unspecified: Secondary | ICD-10-CM | POA: Diagnosis not present

## 2019-04-18 DIAGNOSIS — O10919 Unspecified pre-existing hypertension complicating pregnancy, unspecified trimester: Secondary | ICD-10-CM | POA: Diagnosis not present

## 2019-04-18 HISTORY — DX: Vitamin D deficiency, unspecified: E55.9

## 2019-04-18 NOTE — Progress Notes (Signed)
   Wellness Office Visit  Subjective:  Patient ID: Tara Stark, female    DOB: 01-10-1981  Age: 38 y.o. MRN: [id number]  CC: This lady comes in for follow-up of hypertension, vitamin D deficiency. HPI She is currently pregnant and is expecting to deliver in the first week of April 2021.  She is doing well. She was seen by cardiology and her amlodipine was discontinued and she was started on propanolol.  However, she has not taken this medication as she wanted to discuss this with me first. She has been taking vitamin D3 supplementation for vitamin D deficiency. She is also complaining of dizziness which is not vertiginous in nature.  Past Medical History:  Diagnosis Date  . Acid indigestion   . Fibroid   . Hemorrhoids   . Hypertension   . Thyroid disease   . Vitamin D deficiency disease 04/18/2019      Family History  Problem Relation Age of Onset  . Other Paternal Grandfather        MVA  . Breast cancer Paternal Grandmother   . Alzheimer's disease Maternal Grandmother   . Cancer Maternal Grandfather        throat  . Diabetes Father   . Other Father        has a pacemaker  . Hypertension Mother   . Hypertension Brother   . Hypertension Sister   . Hypertension Sister   . Bronchiolitis Son   . 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     Current Meds  Medication Sig  . prenatal vitamin w/FE, FA (PRENATAL 1 + 1) 27-1 MG TABS tablet Take 1 tablet by mouth daily at 12 noon.  . propranolol (INDERAL) 20 MG tablet Take 1 tablet (20 mg total) by mouth 2 (two) times daily.      Objective:   Today's Vitals: BP (!) 150/90   Pulse 72   Ht 5' (1.524 m)   Wt 187 lb 12.8 oz (85.2 kg)   BMI 36.68 kg/m  Vitals with BMI 04/18/2019 03/31/2019 11/02/2018  Height 5\' 0"  4\' 11"  4\' 11"   Weight 187 lbs 13 oz 189 lbs 182 lbs  BMI 36.68 [id number]  Systolic Q000111Q  [id number] 0000000  Diastolic 90 82 84  Pulse 72 92 97     Physical Exam  She looks systemically well but her blood pressure is not well controlled.  She has not taking any antihypertensive medications.     Assessment   1. Chronic hypertension during pregnancy, antepartum   2. Vitamin D deficiency disease   3. Dizziness   4. Malaise and fatigue       Tests ordered Orders Placed This Encounter  Procedures  . COMPLETE METABOLIC PANEL WITH GFR  . CBC  . VITAMIN D 25 Hydroxy (Vit-D Deficiency, Fractures)  . T3, free  . T4  . TSH  . Ambulatory referral to ENT     Plan: 1. I have recommended that she does take the Inderal twice a day as recommended by cardiology.  She will start this now. 2. Blood work is ordered as above. 3. I am going to send her to ENT for further evaluation of her dizziness. 4. She will follow-up in 3 months with Judson Roch and further recommendations will depend on these results.     Doree Albee, MD

## 2019-04-19 ENCOUNTER — Encounter (INDEPENDENT_AMBULATORY_CARE_PROVIDER_SITE_OTHER): Payer: Self-pay | Admitting: Internal Medicine

## 2019-04-19 LAB — CBC
HCT: 40.8 % (ref 35.0–45.0)
Hemoglobin: 13.4 g/dL (ref 11.7–15.5)
MCH: 26.4 pg — ABNORMAL LOW (ref 27.0–33.0)
MCHC: 32.8 g/dL (ref 32.0–36.0)
MCV: 80.5 fL (ref 80.0–100.0)
MPV: 10.8 fL (ref 7.5–12.5)
Platelets: 234 10*3/uL (ref 140–400)
RBC: 5.07 10*6/uL (ref 3.80–5.10)
RDW: 15.7 % — ABNORMAL HIGH (ref 11.0–15.0)
WBC: 11 10*3/uL — ABNORMAL HIGH (ref 3.8–10.8)

## 2019-04-19 LAB — COMPLETE METABOLIC PANEL WITH GFR
AG Ratio: 1.5 (calc) (ref 1.0–2.5)
ALT: 15 U/L (ref 6–29)
AST: 16 U/L (ref 10–30)
Albumin: 4.1 g/dL (ref 3.6–5.1)
Alkaline phosphatase (APISO): 40 U/L (ref 31–125)
BUN/Creatinine Ratio: 15 (calc) (ref 6–22)
BUN: 7 mg/dL (ref 7–25)
CO2: 24 mmol/L (ref 20–32)
Calcium: 8.9 mg/dL (ref 8.6–10.2)
Chloride: 102 mmol/L (ref 98–110)
Creat: 0.48 mg/dL — ABNORMAL LOW (ref 0.50–1.10)
GFR, Est African American: 144 mL/min/{1.73_m2} (ref >=60)
GFR, Est Non African American: 124 mL/min/{1.73_m2} (ref >=60)
Globulin: 2.7 g/dL (calc) (ref 1.9–3.7)
Glucose, Bld: 76 mg/dL (ref 65–99)
Potassium: 3.9 mmol/L (ref 3.5–5.3)
Sodium: 137 mmol/L (ref 135–146)
Total Bilirubin: 0.3 mg/dL (ref 0.2–1.2)
Total Protein: 6.8 g/dL (ref 6.1–8.1)

## 2019-04-19 LAB — T3, FREE: T3, Free: 3.2 pg/mL (ref 2.3–4.2)

## 2019-04-19 LAB — TSH: TSH: 0.47 mIU/L

## 2019-04-19 LAB — VITAMIN D 25 HYDROXY (VIT D DEFICIENCY, FRACTURES): Vit D, 25-Hydroxy: 19 ng/mL — ABNORMAL LOW (ref 30–100)

## 2019-04-19 LAB — T4: T4, Total: 5.5 ug/dL (ref 5.1–11.9)

## 2019-05-08 ENCOUNTER — Other Ambulatory Visit: Payer: Self-pay

## 2019-05-08 ENCOUNTER — Other Ambulatory Visit: Payer: Self-pay | Admitting: Obstetrics and Gynecology

## 2019-05-08 ENCOUNTER — Ambulatory Visit (INDEPENDENT_AMBULATORY_CARE_PROVIDER_SITE_OTHER): Payer: Medicaid Other | Admitting: Otolaryngology

## 2019-05-08 DIAGNOSIS — H903 Sensorineural hearing loss, bilateral: Secondary | ICD-10-CM | POA: Diagnosis not present

## 2019-05-08 DIAGNOSIS — R42 Dizziness and giddiness: Secondary | ICD-10-CM

## 2019-05-08 DIAGNOSIS — Z3682 Encounter for antenatal screening for nuchal translucency: Secondary | ICD-10-CM

## 2019-05-08 DIAGNOSIS — H6123 Impacted cerumen, bilateral: Secondary | ICD-10-CM

## 2019-05-09 ENCOUNTER — Encounter: Payer: Medicaid Other | Admitting: Women's Health

## 2019-05-09 ENCOUNTER — Other Ambulatory Visit: Payer: Medicaid Other

## 2019-05-09 ENCOUNTER — Ambulatory Visit: Payer: Medicaid Other | Admitting: *Deleted

## 2019-05-10 ENCOUNTER — Encounter: Payer: Medicaid Other | Admitting: Advanced Practice Midwife

## 2019-05-17 ENCOUNTER — Encounter: Payer: Self-pay | Admitting: Advanced Practice Midwife

## 2019-05-17 ENCOUNTER — Other Ambulatory Visit (INDEPENDENT_AMBULATORY_CARE_PROVIDER_SITE_OTHER): Payer: Medicaid Other

## 2019-05-17 ENCOUNTER — Ambulatory Visit (INDEPENDENT_AMBULATORY_CARE_PROVIDER_SITE_OTHER): Payer: Medicaid Other | Admitting: Advanced Practice Midwife

## 2019-05-17 ENCOUNTER — Other Ambulatory Visit: Payer: Self-pay | Admitting: Advanced Practice Midwife

## 2019-05-17 ENCOUNTER — Encounter: Payer: Medicaid Other | Admitting: Advanced Practice Midwife

## 2019-05-17 ENCOUNTER — Other Ambulatory Visit: Payer: Self-pay

## 2019-05-17 VITALS — BP 129/88 | HR 84 | Wt 184.0 lb

## 2019-05-17 DIAGNOSIS — Z1389 Encounter for screening for other disorder: Secondary | ICD-10-CM

## 2019-05-17 DIAGNOSIS — Z3A15 15 weeks gestation of pregnancy: Secondary | ICD-10-CM

## 2019-05-17 DIAGNOSIS — I10 Essential (primary) hypertension: Secondary | ICD-10-CM | POA: Insufficient documentation

## 2019-05-17 DIAGNOSIS — Z9889 Other specified postprocedural states: Secondary | ICD-10-CM

## 2019-05-17 DIAGNOSIS — O09292 Supervision of pregnancy with other poor reproductive or obstetric history, second trimester: Secondary | ICD-10-CM | POA: Diagnosis not present

## 2019-05-17 DIAGNOSIS — O0990 Supervision of high risk pregnancy, unspecified, unspecified trimester: Secondary | ICD-10-CM | POA: Diagnosis not present

## 2019-05-17 DIAGNOSIS — Z331 Pregnant state, incidental: Secondary | ICD-10-CM

## 2019-05-17 DIAGNOSIS — O09299 Supervision of pregnancy with other poor reproductive or obstetric history, unspecified trimester: Secondary | ICD-10-CM

## 2019-05-17 DIAGNOSIS — Z1379 Encounter for other screening for genetic and chromosomal anomalies: Secondary | ICD-10-CM | POA: Diagnosis not present

## 2019-05-17 DIAGNOSIS — O10012 Pre-existing essential hypertension complicating pregnancy, second trimester: Secondary | ICD-10-CM

## 2019-05-17 DIAGNOSIS — O09892 Supervision of other high risk pregnancies, second trimester: Secondary | ICD-10-CM

## 2019-05-17 DIAGNOSIS — Z36 Encounter for antenatal screening for chromosomal anomalies: Secondary | ICD-10-CM | POA: Diagnosis not present

## 2019-05-17 LAB — POCT URINALYSIS DIPSTICK OB
Blood, UA: NEGATIVE
Glucose, UA: NEGATIVE
Ketones, UA: NEGATIVE
Leukocytes, UA: NEGATIVE
Nitrite, UA: NEGATIVE
POC,PROTEIN,UA: NEGATIVE

## 2019-05-17 MED ORDER — PROGESTERONE MICRONIZED 200 MG PO CAPS: 200.0000 mg | ORAL_CAPSULE | Freq: Every day | ORAL | 3 refills | Status: DC

## 2019-05-17 MED ORDER — AMLODIPINE BESYLATE 10 MG PO TABS: 10.0000 mg | ORAL_TABLET | Freq: Every day | ORAL | 4 refills | Status: DC

## 2019-05-17 MED ORDER — BLOOD PRESSURE MONITOR MISC: 0 refills | Status: DC

## 2019-05-17 MED ORDER — ASPIRIN EC 81 MG PO TBEC: 162.0000 mg | DELAYED_RELEASE_TABLET | Freq: Every day | ORAL | 10 refills | Status: DC

## 2019-05-17 NOTE — Patient Instructions (Signed)
Tara Stark, I greatly value your feedback.  If you receive a survey following your visit with [place] today, we appreciate you taking the time to fill it out.  Thanks, [name] CNM  [place]!!! It is now [facility] ([place], [place] [address]) Entrance located off of [address] parking   Nausea & Vomiting  Have saltine crackers or pretzels by your bed and eat a few bites before you raise your head out of bed in the morning  Eat small frequent meals throughout the day instead of large meals  Drink plenty of fluids throughout the day to stay hydrated, just don't drink a lot of fluids with your meals.  This can make your stomach fill up faster making you feel sick  Do not brush your teeth right after you eat  Products with real ginger are good for nausea, like ginger ale and ginger hard candy Make sure it says made with real ginger!  Sucking on sour candy like lemon heads is also good for nausea  If your prenatal vitamins make you nauseated, take them at night so you will sleep through the nausea  Sea Bands  If you feel like you need medicine for the nausea & vomiting please let us know  If you are unable to keep any fluids or food down please let us know   Constipation  Drink plenty of fluid, preferably water, throughout the day  Eat foods high in fiber such as fruits, vegetables, and grains  Exercise, such as walking, is a good way to keep your bowels regular  Drink warm fluids, especially warm prune juice, or decaf coffee  Eat a 1/2 cup of real oatmeal (not instant), 1/2 cup applesauce, and 1/2-1 cup warm prune juice every day  If needed, you may take Colace (docusate sodium) stool softener once or twice a day to help keep the stool soft.   If you still are having problems with constipation, you may take Miralax once daily as needed to help keep your bowels regular.   Home Blood Pressure  Monitoring for Patients   Your provider has recommended that you check your blood pressure (BP) at least once a week at home. If you do not have a blood pressure cuff at home, one will be provided for you. Contact your provider if you have not received your monitor within 1 week.   Helpful Tips for Accurate Home Blood Pressure Checks  . Don't smoke, exercise, or drink caffeine 30 minutes before checking your BP . Use the restroom before checking your BP (a full bladder can raise your pressure) . Relax in a comfortable upright chair . Feet on the ground . Left arm resting comfortably on a flat surface at the level of your heart . Legs uncrossed . Back supported . Sit quietly and don't talk . Place the cuff on your bare arm . Adjust snuggly, so that only two fingertips can fit between your skin and the top of the cuff . Check 2 readings separated by at least one minute . Keep a log of your BP readings . For a visual, please reference this diagram: http://ccnc.care/bpdiagram  Provider Name: [facility]     Phone: [phone]  Zone 1: ALL CLEAR  Continue to monitor your symptoms:  . BP reading is less than 140 (top number) or less than 90 (bottom number)  . No right upper stomach pain .  No headaches or seeing spots . No feeling nauseated or throwing up . No swelling in face and hands  Zone 2: CAUTION Call your doctor's office for any of the following:  . BP reading is greater than 140 (top number) or greater than 90 (bottom number)  . Stomach pain under your ribs in the middle or right side . Headaches or seeing spots . Feeling nauseated or throwing up . Swelling in face and hands  Zone 3: EMERGENCY  Seek immediate medical care if you have any of the following:  . BP reading is greater than160 (top number) or greater than 110 (bottom number) . Severe headaches not improving with Tylenol . Serious difficulty catching your breath . Any worsening symptoms from Zone 2     First Trimester of Pregnancy The first trimester of pregnancy is from week 1 until the end of week 12 (months 1 through 3). A week after a sperm fertilizes an egg, the egg will implant on the wall of the uterus. This embryo will begin to develop into a baby. Genes from you and your partner are forming the baby. The female genes determine whether the baby is a boy or a girl. At 6-8 weeks, the eyes and face are formed, and the heartbeat can be seen on ultrasound. At the end of 12 weeks, all the baby's organs are formed.  Now that you are pregnant, you will want to do everything you can to have a healthy baby. Two of the most important things are to get good prenatal care and to follow your health care provider's instructions. Prenatal care is all the medical care you receive before the baby's birth. This care will help prevent, find, and treat any problems during the pregnancy and childbirth. BODY CHANGES Your body goes through many changes during pregnancy. The changes vary from woman to woman.   You may gain or lose a couple of pounds at first.  You may feel sick to your stomach (nauseous) and throw up (vomit). If the vomiting is uncontrollable, call your health care provider.  You may tire easily.  You may develop headaches that can be relieved by medicines approved by your health care provider.  You may urinate more often. Painful urination may mean you have a bladder infection.  You may develop heartburn as a result of your pregnancy.  You may develop constipation because certain hormones are causing the muscles that push waste through your intestines to slow down.  You may develop hemorrhoids or swollen, bulging veins (varicose veins).  Your breasts may begin to grow larger and become tender. Your nipples may stick out more, and the tissue that surrounds them (areola) may become darker.  Your gums may bleed and may be sensitive to brushing and flossing.  Dark spots or blotches (chloasma,  mask of pregnancy) may develop on your face. This will likely fade after the baby is born.  Your menstrual periods will stop.  You may have a loss of appetite.  You may develop cravings for certain kinds of food.  You may have changes in your emotions from day to day, such as being excited to be pregnant or being concerned that something may go wrong with the pregnancy and baby.  You may have more vivid and strange dreams.  You may have changes in your hair. These can include thickening of your hair, rapid growth, and changes in texture. Some women also have hair loss during or after pregnancy, or hair that feels dry  or thin. Your hair will most likely return to normal after your baby is born. WHAT TO EXPECT AT YOUR PRENATAL VISITS During a routine prenatal visit:  You will be weighed to make sure you and the baby are growing normally.  Your blood pressure will be taken.  Your abdomen will be measured to track your baby's growth.  The fetal heartbeat will be listened to starting around week 10 or 12 of your pregnancy.  Test results from any previous visits will be discussed. Your health care provider may ask you:  How you are feeling.  If you are feeling the baby move.  If you have had any abnormal symptoms, such as leaking fluid, bleeding, severe headaches, or abdominal cramping.  If you have any questions. Other tests that may be performed during your first trimester include:  Blood tests to find your blood type and to check for the presence of any previous infections. They will also be used to check for low iron levels (anemia) and Rh antibodies. Later in the pregnancy, blood tests for diabetes will be done along with other tests if problems develop.  Urine tests to check for infections, diabetes, or protein in the urine.  An ultrasound to confirm the proper growth and development of the baby.  An amniocentesis to check for possible genetic problems.  Fetal screens for  spina bifida and Down syndrome.  You may need other tests to make sure you and the baby are doing well. HOME CARE INSTRUCTIONS  Medicines  Follow your health care provider's instructions regarding medicine use. Specific medicines may be either safe or unsafe to take during pregnancy.  Take your prenatal vitamins as directed.  If you develop constipation, try taking a stool softener if your health care provider approves. Diet  Eat regular, well-balanced meals. Choose a variety of foods, such as meat or vegetable-based protein, fish, milk and low-fat dairy products, vegetables, fruits, and whole grain breads and cereals. Your health care provider will help you determine the amount of weight gain that is right for you.  Avoid raw meat and uncooked cheese. These carry germs that can cause birth defects in the baby.  Eating four or five small meals rather than three large meals a day may help relieve nausea and vomiting. If you start to feel nauseous, eating a few soda crackers can be helpful. Drinking liquids between meals instead of during meals also seems to help nausea and vomiting.  If you develop constipation, eat more high-fiber foods, such as fresh vegetables or fruit and whole grains. Drink enough fluids to keep your urine clear or pale yellow. Activity and Exercise  Exercise only as directed by your health care provider. Exercising will help you:  Control your weight.  Stay in shape.  Be prepared for labor and delivery.  Experiencing pain or cramping in the lower abdomen or low back is a good sign that you should stop exercising. Check with your health care provider before continuing normal exercises.  Try to avoid standing for long periods of time. Move your legs often if you must stand in one place for a long time.  Avoid heavy lifting.  Wear low-heeled shoes, and practice good posture.  You may continue to have sex unless your health care provider directs you otherwise.  Relief of Pain or Discomfort  Wear a good support bra for breast tenderness.    Take warm sitz baths to soothe any pain or discomfort caused by hemorrhoids. Use hemorrhoid cream if your  health care provider approves.    Rest with your legs elevated if you have leg cramps or low back pain.  If you develop varicose veins in your legs, wear support hose. Elevate your feet for 15 minutes, 3-4 times a day. Limit salt in your diet. Prenatal Care  Schedule your prenatal visits by the twelfth week of pregnancy. They are usually scheduled monthly at first, then more often in the last 2 months before delivery.  Write down your questions. Take them to your prenatal visits.  Keep all your prenatal visits as directed by your health care provider. Safety  Wear your seat belt at all times when driving.  Make a list of emergency phone numbers, including numbers for family, friends, the hospital, and police and fire departments. General Tips  Ask your health care provider for a referral to a local prenatal education class. Begin classes no later than at the beginning of month 6 of your pregnancy.  Ask for help if you have counseling or nutritional needs during pregnancy. Your health care provider can offer advice or refer you to specialists for help with various needs.  Do not use hot tubs, steam rooms, or saunas.  Do not douche or use tampons or scented sanitary pads.  Do not cross your legs for long periods of time.  Avoid cat litter boxes and soil used by cats. These carry germs that can cause birth defects in the baby and possibly loss of the fetus by miscarriage or stillbirth.  Avoid all smoking, herbs, alcohol, and medicines not prescribed by your health care provider. Chemicals in these affect the formation and growth of the baby.  Schedule a dentist appointment. At home, brush your teeth with a soft toothbrush and be gentle when you floss. SEEK MEDICAL CARE IF:   You have dizziness.   You have mild pelvic cramps, pelvic pressure, or nagging pain in the abdominal area.  You have persistent nausea, vomiting, or diarrhea.  You have a bad smelling vaginal discharge.  You have pain with urination.  You notice increased swelling in your face, hands, legs, or ankles. SEEK IMMEDIATE MEDICAL CARE IF:   You have a fever.  You are leaking fluid from your vagina.  You have spotting or bleeding from your vagina.  You have severe abdominal cramping or pain.  You have rapid weight gain or loss.  You vomit blood or material that looks like coffee grounds.  You are exposed to [place] measles and have never had them.  You are exposed to fifth disease or chickenpox.  You develop a severe headache.  You have shortness of breath.  You have any kind of trauma, such as from a fall or a car accident. Document Released: 07/07/2001 Document Revised: 11/27/2013 Document Reviewed: 05/23/2013 [facility] Patient Information 2015 [place], [place]. This information is not intended to replace advice given to you by your health care provider. Make sure you discuss any questions you have with your health care provider.  Coronavirus (COVID-19) Are you at risk?  Are you at risk for the Coronavirus (COVID-19)?  To be considered HIGH RISK for Coronavirus (COVID-19), you have to meet the following criteria:  . Traveled to [place], [place], [place], [place] or [place]; or in the [place] to [place], [place], [place], or [place]; and have fever, cough, and shortness of breath within the last 2 weeks of travel OR . Been in close contact with a person diagnosed with COVID-19 within the last 2 weeks and  have fever, cough, and shortness of breath . IF YOU DO NOT MEET THESE CRITERIA, YOU ARE CONSIDERED LOW RISK FOR COVID-19.  What to do if you are HIGH RISK for COVID-19?  Marland Kitchen If you are having a medical emergency, call 911. . Seek medical care right away. Before you go to a doctor's  office, urgent care or emergency department, call ahead and tell them about your recent travel, contact with someone diagnosed with COVID-19, and your symptoms. You should receive instructions from your physician's office regarding next steps of care.  . When you arrive at healthcare provider, tell the healthcare staff immediately you have returned from visiting [place], [place], [place], [place] or [place]; or traveled in the [place] to [place], [place], [place], or [place]; in the last two weeks or you have been in close contact with a person diagnosed with COVID-19 in the last 2 weeks.   . Tell the health care staff about your symptoms: fever, cough and shortness of breath. . After you have been seen by a medical provider, you will be either: o Tested for (COVID-19) and discharged home on quarantine except to seek medical care if symptoms worsen, and asked to  - Stay home and avoid contact with others until you get your results (4-5 days)  - Avoid travel on public transportation if possible (such as bus, train, or airplane) or o Sent to the Emergency Department by EMS for evaluation, COVID-19 testing, and possible admission depending on your condition and test results.  What to do if you are LOW RISK for COVID-19?  Reduce your risk of any infection by using the same precautions used for avoiding the common cold or flu:  Marland Kitchen Wash your hands often with soap and warm water for at least 20 seconds.  If soap and water are not readily available, use an alcohol-based hand sanitizer with at least 60% alcohol.  . If coughing or sneezing, cover your mouth and nose by coughing or sneezing into the elbow areas of your shirt or coat, into a tissue or into your sleeve (not your hands). . Avoid shaking hands with others and consider head nods or verbal greetings only. . Avoid touching your eyes, nose, or mouth with unwashed hands.  . Avoid close contact with people who are sick. . Avoid places or  events with large numbers of people in one location, like concerts or sporting events. . Carefully consider travel plans you have or are making. . If you are planning any travel outside or inside the [place], visit the CDC's Travelers' Health webpage for the latest health notices. . If you have some symptoms but not all symptoms, continue to monitor at home and seek medical attention if your symptoms worsen. . If you are having a medical emergency, call 911.   [place] / e-Visit: [email or web address]         [facility]: [facility]: [id number]                   [facility]: [phone]

## 2019-05-17 NOTE — Progress Notes (Signed)
US TV 15+3 wks,posterior placenta,cervical length 5.2 cm with and w/o pressure,anterior fundal fibroid 5.2 x 4.1 x 3.6 cm,svp of fluid 4 cm,normal ovaries bilat,fhr 177 bpm

## 2019-05-17 NOTE — Progress Notes (Signed)
INITIAL OBSTETRICAL VISIT Patient name: Tara Stark MRN [id number]  Date of birth: [birth year] Chief Complaint:   Initial Prenatal Visit  History of Present Illness:   Tara Stark is a 38 y.o. G72P2002 African American female at [redacted]w[redacted]d by 8wk outside U/S with an Estimated Date of Delivery: 11/05/19 being seen today for her initial obstetrical visit.   Her obstetrical history is significant for intrauterine growth restriction (IUGR) and short cervix, closely spaced pregnancies, and cHTN (managed on Norvasc).   Today she reports no complaints.  Patient's last menstrual period was 01/14/2019. Last pap 03/2019. Results were: normal Review of Systems:   Pertinent items are noted in HPI Denies cramping/contractions, leakage of fluid, vaginal bleeding, abnormal vaginal discharge w/ itching/odor/irritation, headaches, visual changes, shortness of breath, chest pain, abdominal pain, severe nausea/vomiting, or problems with urination or bowel movements unless otherwise stated above.  Pertinent History Reviewed:  Reviewed past medical,surgical, social, obstetrical and family history.  Reviewed problem list, medications and allergies. OB History  Gravida Para Term Preterm AB Living  3 2 2  0 0 2  SAB TAB Ectopic Multiple Live Births  0 0 0 0 2    # Outcome Date GA Lbr Len/2nd Weight Sex Delivery Anes PTL Lv  3 Current           2 Term 10/04/18 [redacted]w[redacted]d 14:09 / 00:22 7 lb 7.2 oz (3.379 kg) F Vag-Spont EPI  LIV     Birth Comments: moulding  1 Term 04/03/17 [redacted]w[redacted]d  4 lb 7 oz (2.013 kg) M Vag-Spont EPI N LIV     Complications: Pre-eclampsia, History of cervical cerclage   Physical Assessment:   Vitals:   05/17/19 1521  BP: 129/88  Pulse: 84  Weight: 184 lb (83.5 kg)  Body mass index is 35.94 kg/m.       Physical Examination:  General appearance - well appearing, and in no distress  Mental status - alert, oriented to person, place, and time  Psych:  She has a normal mood and affect  Skin  - warm and dry, normal color, no suspicious lesions noted  Chest - effort normal, all lung fields clear to auscultation bilaterally  Heart - normal rate and regular rhythm  Abdomen - soft, nontender  Extremities:  No swelling or varicosities noted  Pelvic - VULVA: normal appearing vulva with no masses, tenderness or lesions  VAGINA: normal appearing vagina with normal color and discharge, no lesions  CERVIX: normal appearing cervix without discharge or lesions, no CMT  Thin prep pap is not done.  Too late for NT, cx length u/s today: US TV 15+3 wks,posterior placenta,cervical length 5.2 cm with and w/o pressure,anterior fundal fibroid 5.2 x 4.1 x 3.6 cm,svp of fluid 4 cm,normal ovaries bilat,fhr 177 bpm  Results for orders placed or performed in visit on 05/17/19 (from the past 24 hour(s))  POC Urinalysis Dipstick OB   Collection Time: 05/17/19  3:32 PM  Result Value Ref Range   Color, UA     Clarity, UA     Glucose, UA Negative Negative   Bilirubin, UA     Ketones, UA neg    Spec Grav, UA     Blood, UA neg    pH, UA     POC,PROTEIN,UA Negative Negative, Trace, Small (1+), Moderate (2+), Large (3+), 4+   Urobilinogen, UA     Nitrite, UA neg    Leukocytes, UA Negative Negative   Appearance     Odor  Assessment & Plan:  1) High-Risk Pregnancy G3P2002 at [redacted]w[redacted]d with an Estimated Date of Delivery: 11/05/19 by 8wk outside U/S   2) Initial OB visit- closely spaced preg (delivery 09/2018)  3) cHTN: on propranolol bid, will change to Norvasc 10mg  qd; start ASA 162mg  qd  4) Hx shortened cx: first preg with cerclage and delivery at 37wks; second preg only used Prometrium with delivery at 37wks   5) Hx IUGR & pre-e: first preg  Meds:  Meds ordered this encounter  Medications  . Blood Pressure Monitor MISC    Sig: For regular home bp monitoring during pregnancy    Dispense:  1 each    Refill:  0    O09.90  . amLODipine (NORVASC) 10 MG tablet    Sig: Take 1 tablet (10 mg total)  by mouth daily.    Dispense:  30 tablet    Refill:  4  . aspirin EC 81 MG tablet    Sig: Take 2 tablets (162 mg total) by mouth daily.    Dispense:  60 tablet    Refill:  10  . progesterone (PROMETRIUM) 200 MG capsule    Sig: Place 1 capsule (200 mg total) vaginally at bedtime.    Dispense:  30 capsule    Refill:  3    Initial labs obtained Continue prenatal vitamins Reviewed n/v relief measures and warning s/s to report Reviewed recommended weight gain based on pre-gravid BMI Encouraged well-balanced diet Genetic Screening discussed: requested Materniti21 Cystic fibrosis, SMA, Fragile X screening discussed results reviewed Ultrasound discussed; fetal survey: requested CCNC completed>PCM not here, form faxed The nature of [place] for [employer] with multiple MDs and other Advanced Practice Providers was explained to patient; also emphasized that fellows, residents, and students are part of our team. Has home bp cuff. Check bp weekly, let us know if >140/90.   Indications for ASA therapy (per uptodate) One of the following: Previous pregnancy with preeclampsia, especially early onset and with an adverse outcome Yes Multifetal gestation No Chronic hypertension Yes Type 1 or 2 diabetes mellitus No Chronic kidney disease No Autoimmune disease (antiphospholipid syndrome, systemic lupus erythematosus) No  Follow-up: Return in about 3 weeks (around 06/07/2019) for [place]: Anatomy, in person with MD.   Orders Placed This Encounter  Procedures  . OB RESULTS CONSOLE GC/Chlamydia  . OB RESULTS CONSOLE RPR  . OB RESULTS CONSOLE HIV antibody  . OB RESULTS CONSOLE Rubella Antibody  . OB RESULTS CONSOLE Hepatitis B surface antigen  . MaterniT 21 plus Core, Blood  . Comprehensive metabolic panel  . Protein / creatinine ratio, urine  . POC Urinalysis Dipstick OB  . OB RESULTS CONSOLE ABO/Rh  . OB RESULTS CONSOLE Antibody Screen    [facility] 05/17/2019  5:18 PM

## 2019-05-18 LAB — PROTEIN / CREATININE RATIO, URINE
Creatinine, Urine: 35.8 mg/dL
Protein, Ur: 6.4 mg/dL
Protein/Creat Ratio: 179 mg/g creat (ref 0–200)

## 2019-05-18 LAB — COMPREHENSIVE METABOLIC PANEL
ALT: 24 IU/L (ref 0–32)
AST: 16 IU/L (ref 0–40)
Albumin/Globulin Ratio: 1.7 (ref 1.2–2.2)
Albumin: 4.2 g/dL (ref 3.8–4.8)
Alkaline Phosphatase: 46 IU/L (ref 39–117)
BUN/Creatinine Ratio: 16 (ref 9–23)
BUN: 7 mg/dL (ref 6–20)
Bilirubin Total: <0.2 mg/dL (ref 0.0–1.2)
CO2: 24 mmol/L (ref 20–29)
Calcium: 9.7 mg/dL (ref 8.7–10.2)
Chloride: 101 mmol/L (ref 96–106)
Creatinine, Ser: 0.43 mg/dL — ABNORMAL LOW (ref 0.57–1.00)
GFR calc Af Amer: 149 mL/min/{1.73_m2} (ref >59)
GFR calc non Af Amer: 129 mL/min/{1.73_m2} (ref >59)
Globulin, Total: 2.5 g/dL (ref 1.5–4.5)
Glucose: 89 mg/dL (ref 65–99)
Potassium: 4.1 mmol/L (ref 3.5–5.2)
Sodium: 138 mmol/L (ref 134–144)
Total Protein: 6.7 g/dL (ref 6.0–8.5)

## 2019-05-22 LAB — MATERNIT 21 PLUS CORE, BLOOD
Fetal Fraction: 6
Result (T21): NEGATIVE
Trisomy 13 (Patau syndrome): NEGATIVE
Trisomy 18 (Edwards syndrome): NEGATIVE
Trisomy 21 (Down syndrome): NEGATIVE

## 2019-05-24 ENCOUNTER — Encounter (INDEPENDENT_AMBULATORY_CARE_PROVIDER_SITE_OTHER): Payer: Self-pay | Admitting: Nurse Practitioner

## 2019-05-24 DIAGNOSIS — O0990 Supervision of high risk pregnancy, unspecified, unspecified trimester: Secondary | ICD-10-CM | POA: Diagnosis not present

## 2019-06-06 ENCOUNTER — Other Ambulatory Visit: Payer: Self-pay | Admitting: Advanced Practice Midwife

## 2019-06-06 DIAGNOSIS — O09292 Supervision of pregnancy with other poor reproductive or obstetric history, second trimester: Secondary | ICD-10-CM

## 2019-06-06 DIAGNOSIS — Z363 Encounter for antenatal screening for malformations: Secondary | ICD-10-CM

## 2019-06-07 ENCOUNTER — Encounter: Payer: Self-pay | Admitting: Obstetrics and Gynecology

## 2019-06-07 ENCOUNTER — Ambulatory Visit (INDEPENDENT_AMBULATORY_CARE_PROVIDER_SITE_OTHER): Payer: Medicaid Other | Admitting: Obstetrics and Gynecology

## 2019-06-07 ENCOUNTER — Ambulatory Visit (INDEPENDENT_AMBULATORY_CARE_PROVIDER_SITE_OTHER): Payer: Medicaid Other

## 2019-06-07 ENCOUNTER — Telehealth: Payer: Self-pay | Admitting: Obstetrics and Gynecology

## 2019-06-07 ENCOUNTER — Other Ambulatory Visit: Payer: Self-pay

## 2019-06-07 VITALS — BP 133/86 | HR 97

## 2019-06-07 DIAGNOSIS — O10012 Pre-existing essential hypertension complicating pregnancy, second trimester: Secondary | ICD-10-CM

## 2019-06-07 DIAGNOSIS — Z363 Encounter for antenatal screening for malformations: Secondary | ICD-10-CM

## 2019-06-07 DIAGNOSIS — I10 Essential (primary) hypertension: Secondary | ICD-10-CM

## 2019-06-07 DIAGNOSIS — Z3A18 18 weeks gestation of pregnancy: Secondary | ICD-10-CM

## 2019-06-07 DIAGNOSIS — Z1389 Encounter for screening for other disorder: Secondary | ICD-10-CM

## 2019-06-07 DIAGNOSIS — O09292 Supervision of pregnancy with other poor reproductive or obstetric history, second trimester: Secondary | ICD-10-CM

## 2019-06-07 DIAGNOSIS — Z331 Pregnant state, incidental: Secondary | ICD-10-CM

## 2019-06-07 DIAGNOSIS — O09892 Supervision of other high risk pregnancies, second trimester: Secondary | ICD-10-CM

## 2019-06-07 DIAGNOSIS — O0990 Supervision of high risk pregnancy, unspecified, unspecified trimester: Secondary | ICD-10-CM

## 2019-06-07 LAB — POCT URINALYSIS DIPSTICK OB
Blood, UA: NEGATIVE
Glucose, UA: NEGATIVE
Ketones, UA: NEGATIVE
Leukocytes, UA: NEGATIVE
Nitrite, UA: NEGATIVE
POC,PROTEIN,UA: NEGATIVE

## 2019-06-07 NOTE — Progress Notes (Signed)
[place] TA/TV: 18+3 wks,breech,posterior placenta gr 0,fundal fibroid  6.5 x 4.4 x 2.6 cm,svp of fluid 5.4 cm,normal ovaries,CX 3.7-4.3 cm w and w/o pressure,fhr 144 bpm,efw 247 g 53%

## 2019-06-07 NOTE — Progress Notes (Signed)
Patient ID: Tara Stark, female   DOB: [birth year], 38 y.o.   MRN: [id number]    [facility] PREGNANCY VISIT Patient name: Tara Stark MRN [id number]  Date of birth: 1981/07/25 Chief Complaint:   Routine Prenatal Visit (anatomy scan)  History of Present Illness:   Tara Stark is a 38 y.o. G34P2002 female at [redacted]w[redacted]d with an Estimated Date of Delivery: 11/05/19 being seen today for ongoing management of a high-risk pregnancy complicated by chronic hypertension currently on Norvasc 10 mg, hx of cervical cerlcage and IUGR infant with first pregnancy, . She is still taking Prometrium suppositories  Today she reports no complaints. Contractions: Not present. Vag. Bleeding: None.   . denies leaking of fluid.  Review of Systems:   Pertinent items are noted in HPI Denies abnormal vaginal discharge w/ itching/odor/irritation, headaches, visual changes, shortness of breath, chest pain, abdominal pain, severe nausea/vomiting, or problems with urination or bowel movements unless otherwise stated above. Pertinent History Reviewed:  Reviewed past medical,surgical, social, obstetrical and family history.  Reviewed problem list, medications and allergies. Physical Assessment:   Vitals:   06/07/19 1556  BP: 133/86  Pulse: 97  There is no height or weight on file to calculate BMI.           Physical Examination:   General appearance: alert, well appearing, and in no distress  Mental status: alert, oriented to person, place, and time, normal mood, behavior, speech, dress, motor activity, and thought processes  Skin: warm & dry   Extremities: Edema: None    Cardiovascular: normal heart rate noted  Respiratory: normal respiratory effort, no distress  Abdomen: gravid, soft, non-tender  Pelvic: Cervical exam deferred         Fetal Status:          Fetal Surveillance Testing today: [place] TA/TV: 18+3 wks,breech,posterior placenta gr 0,fundal fibroid  6.5 x 4.4 x 2.6 cm,svp of fluid 5.4 cm,normal ovaries,CX  3.7-4.3 cm w and w/o pressure,fhr 144 bpm,efw 247 g 53%   Chaperone: Tara Stark    No results found for this or any previous visit (from the past 24 hour(s)).  Assessment & Plan:  1) High-risk pregnancy G3P2002 at [redacted]w[redacted]d with an Estimated Date of Delivery: 11/05/19 hx IUGR fetus, then normal weight fetus  2) CHTN, stable on Norvasc 10 q d  3) history of cerclage for short cervix.in first pregnancy, then funneling in second pregnancy tx'd with Prometrium and term delivery.  Meds:   Labs/procedures today: u/s  Treatment Plan:  F/u in 4 weeks, bring medications to visit to confirm meds and dosing.  Repeat u/s 2 wk for CL.  Follow-up:   Orders Placed This Encounter  Procedures  . POC Urinalysis Dipstick OB   By signing my name below, I, Samul Dada, attest that this documentation has been prepared under the direction and in the presence of Jonnie Kind, MD. Electronically Signed: Marty. 06/07/19. 4:00 PM.  I personally performed the services described in this documentation, which was SCRIBED in my presence. The recorded information has been reviewed and considered accurate. It has been edited as necessary during review. Jonnie Kind, MD

## 2019-06-08 NOTE — Telephone Encounter (Signed)
LMOVM that Amber is on vacation that week and will need to be scheduled at [facility].  Advised to call me back for me to get this set up.

## 2019-06-13 ENCOUNTER — Telehealth: Payer: Self-pay | Admitting: *Deleted

## 2019-06-13 ENCOUNTER — Other Ambulatory Visit: Payer: Self-pay | Admitting: *Deleted

## 2019-06-13 ENCOUNTER — Encounter: Payer: Self-pay | Admitting: *Deleted

## 2019-06-13 DIAGNOSIS — Z9889 Other specified postprocedural states: Secondary | ICD-10-CM

## 2019-06-13 DIAGNOSIS — O09892 Supervision of other high risk pregnancies, second trimester: Secondary | ICD-10-CM

## 2019-06-13 DIAGNOSIS — O0990 Supervision of high risk pregnancy, unspecified, unspecified trimester: Secondary | ICD-10-CM

## 2019-06-13 NOTE — Telephone Encounter (Signed)
LMOVM that I was able to get her scheduled for an u/s with MFM for cervical length on Monday 11/23 @ 3:45.

## 2019-06-19 ENCOUNTER — Other Ambulatory Visit (HOSPITAL_COMMUNITY): Payer: Medicaid Other

## 2019-06-28 ENCOUNTER — Telehealth: Payer: Self-pay | Admitting: *Deleted

## 2019-06-28 NOTE — Telephone Encounter (Signed)
Left message @ 5:45 pm. JSY

## 2019-06-28 NOTE — Telephone Encounter (Signed)
Pt has an appt tomorrow and has questions. Please call. Thanks!!

## 2019-06-29 ENCOUNTER — Other Ambulatory Visit: Payer: Self-pay

## 2019-06-29 ENCOUNTER — Other Ambulatory Visit: Payer: Self-pay | Admitting: Obstetrics and Gynecology

## 2019-06-29 ENCOUNTER — Ambulatory Visit (HOSPITAL_COMMUNITY)
Admission: RE | Admit: 2019-06-29 | Discharge: 2019-06-29 | Disposition: A | Discharge status: Home / Self Care | Payer: Medicaid Other | Source: Ambulatory Visit | Attending: Obstetrics and Gynecology | Admitting: Obstetrics and Gynecology

## 2019-06-29 ENCOUNTER — Other Ambulatory Visit: Payer: Medicaid Other

## 2019-06-29 DIAGNOSIS — O09292 Supervision of pregnancy with other poor reproductive or obstetric history, second trimester: Secondary | ICD-10-CM

## 2019-06-29 DIAGNOSIS — O10012 Pre-existing essential hypertension complicating pregnancy, second trimester: Secondary | ICD-10-CM | POA: Diagnosis not present

## 2019-06-29 DIAGNOSIS — O09892 Supervision of other high risk pregnancies, second trimester: Secondary | ICD-10-CM

## 2019-06-29 DIAGNOSIS — O0990 Supervision of high risk pregnancy, unspecified, unspecified trimester: Secondary | ICD-10-CM | POA: Diagnosis not present

## 2019-06-29 DIAGNOSIS — Z3A21 21 weeks gestation of pregnancy: Secondary | ICD-10-CM

## 2019-06-29 DIAGNOSIS — Z9889 Other specified postprocedural states: Secondary | ICD-10-CM

## 2019-06-29 DIAGNOSIS — O99212 Obesity complicating pregnancy, second trimester: Secondary | ICD-10-CM | POA: Diagnosis not present

## 2019-06-29 NOTE — Telephone Encounter (Signed)
Telephoned patient at mobile number and left message to return call.

## 2019-06-29 NOTE — Telephone Encounter (Signed)
Pt has appt for [place] with MFM. No answer.

## 2019-07-05 ENCOUNTER — Encounter: Payer: Medicaid Other | Admitting: Obstetrics and Gynecology

## 2019-07-06 ENCOUNTER — Other Ambulatory Visit: Payer: Self-pay

## 2019-07-06 ENCOUNTER — Encounter: Payer: Self-pay | Admitting: Women's Health

## 2019-07-06 ENCOUNTER — Ambulatory Visit (INDEPENDENT_AMBULATORY_CARE_PROVIDER_SITE_OTHER): Payer: Medicaid Other | Admitting: Women's Health

## 2019-07-06 VITALS — BP 127/81 | HR 92 | Wt 182.4 lb

## 2019-07-06 DIAGNOSIS — Z331 Pregnant state, incidental: Secondary | ICD-10-CM

## 2019-07-06 DIAGNOSIS — I10 Essential (primary) hypertension: Secondary | ICD-10-CM

## 2019-07-06 DIAGNOSIS — O09299 Supervision of pregnancy with other poor reproductive or obstetric history, unspecified trimester: Secondary | ICD-10-CM

## 2019-07-06 DIAGNOSIS — O0992 Supervision of high risk pregnancy, unspecified, second trimester: Secondary | ICD-10-CM

## 2019-07-06 DIAGNOSIS — Z1389 Encounter for screening for other disorder: Secondary | ICD-10-CM

## 2019-07-06 DIAGNOSIS — O0990 Supervision of high risk pregnancy, unspecified, unspecified trimester: Secondary | ICD-10-CM

## 2019-07-06 DIAGNOSIS — Z3A22 22 weeks gestation of pregnancy: Secondary | ICD-10-CM

## 2019-07-06 DIAGNOSIS — O09292 Supervision of pregnancy with other poor reproductive or obstetric history, second trimester: Secondary | ICD-10-CM

## 2019-07-06 LAB — POCT URINALYSIS DIPSTICK OB
Blood, UA: NEGATIVE
Glucose, UA: NEGATIVE
Leukocytes, UA: NEGATIVE
Nitrite, UA: NEGATIVE
POC,PROTEIN,UA: NEGATIVE

## 2019-07-06 NOTE — Progress Notes (Signed)
   HIGH-RISK PREGNANCY VISIT Patient name: Tara Stark MRN [id number]  Date of birth: 06-27-1981 Chief Complaint:   High Risk Gestation  History of Present Illness:   Tara Stark is a 38 y.o. G70P2002 female at [redacted]w[redacted]d with an Estimated Date of Delivery: 11/05/19 being seen today for ongoing management of a high-risk pregnancy complicated by chronic hypertension currently on norvasc 10mg , h/o short cx currently on prometrium.  Today she reports no complaints. Contractions: Not present.  .  Movement: Present. denies leaking of fluid.  Review of Systems:   Pertinent items are noted in HPI Denies abnormal vaginal discharge w/ itching/odor/irritation, headaches, visual changes, shortness of breath, chest pain, abdominal pain, severe nausea/vomiting, or problems with urination or bowel movements unless otherwise stated above. Pertinent History Reviewed:  Reviewed past medical,surgical, social, obstetrical and family history.  Reviewed problem list, medications and allergies. Physical Assessment:   Vitals:   07/06/19 1340  BP: 127/81  Pulse: 92  Weight: 182 lb 6.4 oz (82.7 kg)  Body mass index is 35.62 kg/m.           Physical Examination:   General appearance: alert, well appearing, and in no distress  Mental status: alert, oriented to person, place, and time  Skin: warm & dry   Extremities: Edema: None    Cardiovascular: normal heart rate noted  Respiratory: normal respiratory effort, no distress  Abdomen: gravid, soft, non-tender  Pelvic: Cervical exam deferred         Fetal Status: Fetal Heart Rate (bpm): 142 Fundal Height: 28 cm Movement: Present    Fetal Surveillance Testing today: doppler   Chaperone: n/a    Results for orders placed or performed in visit on 07/06/19 (from the past 24 hour(s))  POC Urinalysis Dipstick OB   Collection Time: 07/06/19  1:49 PM  Result Value Ref Range   Color, UA     Clarity, UA     Glucose, UA Negative Negative   Bilirubin, UA     Ketones, UA small    Spec Grav, UA     Blood, UA neg    pH, UA     POC,PROTEIN,UA Negative Negative, Trace, Small (1+), Moderate (2+), Large (3+), 4+   Urobilinogen, UA     Nitrite, UA neg    Leukocytes, UA Negative Negative   Appearance     Odor      Assessment & Plan:  1) High-risk pregnancy G3P2002 at [redacted]w[redacted]d with an Estimated Date of Delivery: 11/05/19   2) CHTN, stable on norvasc 10mg , continue ASA  3) H/O short cx, stable, CL 3.4cm w/ MFM last week, continue nightly prometrium  Meds: No orders of the defined types were placed in this encounter.  Labs/procedures today: none  Treatment Plan:  Growth u/s q4wks    2x/wk testing nst/sono @ 32wks or weekly BPP    Deliver 38-39wks (37wks or prn if poor control)  Reviewed: Preterm labor symptoms and general obstetric precautions including but not limited to vaginal bleeding, contractions, leaking of fluid and fetal movement were reviewed in detail with the patient.  All questions were answered.  Follow-up: Return in about 8 days (around 07/14/2019) for US:EFW (no visit), then 4wks from now for [place] in office w/ MD, PN2, EFW u/s.  Orders Placed This Encounter  Procedures  . US OB Follow Up  . POC Urinalysis Dipstick OB   Roma Schanz CNM, [facility] 07/06/2019 2:11 PM

## 2019-07-06 NOTE — Patient Instructions (Signed)
Tara Stark, I greatly value your feedback.  If you receive a survey following your visit with [place] today, we appreciate you taking the time to fill it out.  Thanks, [name], CNM, WHNP-BC   You will have your sugar test next visit.  Please do not eat or drink anything after midnight the night before you come, not even water.  You will be here for at least two hours.  Please make an appointment online for the bloodwork at [email or web address] for 8:30am (or as close to this as possible). Make sure you select the [facility] service center. The day of the appointment, check in with our office first, then you will go to [place] to start the sugar test.    [place] It is now [place] at [facility] ([place], [place] [address]) Entrance C, located off of [address] parking  Go to [email or web address] to register for FREE online childbirth classes   Call the office [phone]) or go to [facility] if:  You begin to have strong, frequent contractions  Your water breaks.  Sometimes it is a big gush of fluid, sometimes it is just a trickle that keeps getting your panties wet or running down your legs  You have vaginal bleeding.  It is normal to have a small amount of spotting if your cervix was checked.   You don't feel your baby moving like normal.  If you don't, get you something to eat and drink and lay down and focus on feeling your baby move.   If your baby is still not moving like normal, you should call the office or go to [place] Pediatricians/Family Doctors:  [place] [phone]                 [place] [phone] (usually not accepting new patients unless you have family there already, you are always welcome to call and ask)       [facility] Department [phone]       [facility] Pediatricians/Family  Doctors:   [facility]: [phone]  [facility]: [phone]  [facility]: [place] Doctors:   [facility]: [facility]: [place]:  [place]: [phone]   Home Blood Pressure Monitoring for Patients   Your provider has recommended that you check your blood pressure (BP) at least once a week at home. If you do not have a blood pressure cuff at home, one will be provided for you. Contact your provider if you have not received your monitor within 1 week.   Helpful Tips for Accurate Home Blood Pressure Checks  . Don't smoke, exercise, or drink caffeine 30 minutes before checking your BP . Use the restroom before checking your BP (a full bladder can raise your pressure) . Relax in a comfortable upright chair . Feet on the ground . Left arm resting comfortably on a flat surface at the level of your heart . Legs uncrossed . Back supported . Sit quietly and don't talk . Place the cuff on your bare arm . Adjust snuggly, so that only two fingertips can fit between your skin and the top of the cuff . Check 2 readings separated by at least one minute . Keep a log of your  BP readings . For a visual, please reference this diagram: http://ccnc.care/bpdiagram  Provider Name: [facility]     Phone: [phone]  Zone 1: ALL CLEAR  Continue to monitor your symptoms:  . BP reading is less than 140 (top number) or less than 90 (bottom number)  . No right upper stomach pain . No headaches or seeing spots . No feeling nauseated or throwing up . No swelling in face and hands  Zone 2: CAUTION Call your doctor's office for any of the following:  . BP reading is greater than 140 (top number) or greater than 90 (bottom number)  . Stomach pain under your ribs in the middle or right side . Headaches or seeing spots . Feeling  nauseated or throwing up . Swelling in face and hands  Zone 3: EMERGENCY  Seek immediate medical care if you have any of the following:  . BP reading is greater than160 (top number) or greater than 110 (bottom number) . Severe headaches not improving with Tylenol . Serious difficulty catching your breath . Any worsening symptoms from Zone 2   Second Trimester of Pregnancy The second trimester is from week 13 through week 28, months 4 through 6. The second trimester is often a time when you feel your best. Your body has also adjusted to being pregnant, and you begin to feel better physically. Usually, morning sickness has lessened or quit completely, you may have more energy, and you may have an increase in appetite. The second trimester is also a time when the fetus is growing rapidly. At the end of the sixth month, the fetus is about 9 inches long and weighs about 1 pounds. You will likely begin to feel the baby move (quickening) between 18 and 20 weeks of the pregnancy. BODY CHANGES Your body goes through many changes during pregnancy. The changes vary from woman to woman.   Your weight will continue to increase. You will notice your lower abdomen bulging out.  You may begin to get stretch marks on your hips, abdomen, and breasts.  You may develop headaches that can be relieved by medicines approved by your health care provider.  You may urinate more often because the fetus is pressing on your bladder.  You may develop or continue to have heartburn as a result of your pregnancy.  You may develop constipation because certain hormones are causing the muscles that push waste through your intestines to slow down.  You may develop hemorrhoids or swollen, bulging veins (varicose veins).  You may have back pain because of the weight gain and pregnancy hormones relaxing your joints between the bones in your pelvis and as a result of a shift in weight and the muscles that support your  balance.  Your breasts will continue to grow and be tender.  Your gums may bleed and may be sensitive to brushing and flossing.  Dark spots or blotches (chloasma, mask of pregnancy) may develop on your face. This will likely fade after the baby is born.  A dark line from your belly button to the pubic area (linea nigra) may appear. This will likely fade after the baby is born.  You may have changes in your hair. These can include thickening of your hair, rapid growth, and changes in texture. Some women also have hair loss during or after pregnancy, or hair that feels dry or thin. Your hair will most likely return to normal after your baby is born. WHAT TO EXPECT AT YOUR PRENATAL VISITS  During a routine prenatal visit:  You will be weighed to make sure you and the fetus are growing normally.  Your blood pressure will be taken.  Your abdomen will be measured to track your baby's growth.  The fetal heartbeat will be listened to.  Any test results from the previous visit will be discussed. Your health care provider may ask you:  How you are feeling.  If you are feeling the baby move.  If you have had any abnormal symptoms, such as leaking fluid, bleeding, severe headaches, or abdominal cramping.  If you have any questions. Other tests that may be performed during your second trimester include:  Blood tests that check for:  Low iron levels (anemia).  Gestational diabetes (between 24 and 28 weeks).  Rh antibodies.  Urine tests to check for infections, diabetes, or protein in the urine.  An ultrasound to confirm the proper growth and development of the baby.  An amniocentesis to check for possible genetic problems.  Fetal screens for spina bifida and Down syndrome. HOME CARE INSTRUCTIONS   Avoid all smoking, herbs, alcohol, and unprescribed drugs. These chemicals affect the formation and growth of the baby.  Follow your health care provider's instructions regarding  medicine use. There are medicines that are either safe or unsafe to take during pregnancy.  Exercise only as directed by your health care provider. Experiencing uterine cramps is a good sign to stop exercising.  Continue to eat regular, healthy meals.  Wear a good support bra for breast tenderness.  Do not use hot tubs, steam rooms, or saunas.  Wear your seat belt at all times when driving.  Avoid raw meat, uncooked cheese, cat litter boxes, and soil used by cats. These carry germs that can cause birth defects in the baby.  Take your prenatal vitamins.  Try taking a stool softener (if your health care provider approves) if you develop constipation. Eat more high-fiber foods, such as fresh vegetables or fruit and whole grains. Drink plenty of fluids to keep your urine clear or pale yellow.  Take warm sitz baths to soothe any pain or discomfort caused by hemorrhoids. Use hemorrhoid cream if your health care provider approves.  If you develop varicose veins, wear support hose. Elevate your feet for 15 minutes, 3-4 times a day. Limit salt in your diet.  Avoid heavy lifting, wear low heel shoes, and practice good posture.  Rest with your legs elevated if you have leg cramps or low back pain.  Visit your dentist if you have not gone yet during your pregnancy. Use a soft toothbrush to brush your teeth and be gentle when you floss.  A sexual relationship may be continued unless your health care provider directs you otherwise.  Continue to go to all your prenatal visits as directed by your health care provider. SEEK MEDICAL CARE IF:   You have dizziness.  You have mild pelvic cramps, pelvic pressure, or nagging pain in the abdominal area.  You have persistent nausea, vomiting, or diarrhea.  You have a bad smelling vaginal discharge.  You have pain with urination. SEEK IMMEDIATE MEDICAL CARE IF:   You have a fever.  You are leaking fluid from your vagina.  You have spotting or  bleeding from your vagina.  You have severe abdominal cramping or pain.  You have rapid weight gain or loss.  You have shortness of breath with chest pain.  You notice sudden or extreme swelling of your face, hands, ankles, feet, or legs.  You have not felt your baby move in over an hour.  You have severe headaches that do not go away with medicine.  You have vision changes. Document Released: 07/07/2001 Document Revised: 07/18/2013 Document Reviewed: 09/13/2012 [facility] Patient Information 2015 [place], [place]. This information is not intended to replace advice given to you by your health care provider. Make sure you discuss any questions you have with your health care provider.  PROTECT YOURSELF & YOUR BABY FROM THE FLU! Because you are pregnant, we at [facility], along with the Centers for Disease Control (CDC), recommend that you receive the flu vaccine to protect yourself and your baby from the flu. The flu is more likely to cause severe illness in pregnant women than in women of reproductive age who are not pregnant. Changes in the immune system, heart, and lungs during pregnancy make pregnant women (and women up to two weeks postpartum) more prone to severe illness from flu, including illness resulting in hospitalization. Flu also may be harmful for a pregnant woman's developing baby. A common flu symptom is fever, which may be associated with neural tube defects and other adverse outcomes for a developing baby. Getting vaccinated can also help protect a baby after birth from flu. (Mom passes antibodies onto the developing baby during her pregnancy.)  A Flu Vaccine is the Best Protection Against Flu Getting a flu vaccine is the first and most important step in protecting against flu. Pregnant women should get a flu shot and not the live attenuated influenza vaccine (LAIV), also known as nasal spray flu vaccine. Flu vaccines given during pregnancy help protect both the mother and her  baby from flu. Vaccination has been shown to reduce the risk of flu-associated acute respiratory infection in pregnant women by up to one-half. A 2018 study showed that getting a flu shot reduced a pregnant woman's risk of being hospitalized with flu by an average of 40 percent. Pregnant women who get a flu vaccine are also helping to protect their babies from flu illness for the first several months after their birth, when they are too young to get vaccinated.   A Long Record of Safety for Flu Shots in Pregnant Women Flu shots have been given to millions of pregnant women over many years with a good safety record. There is a lot of evidence that flu vaccines can be given safely during pregnancy; though these data are limited for the first trimester. The CDC recommends that pregnant women get vaccinated during any trimester of their pregnancy. It is very important for pregnant women to get the flu shot.   Other Preventive Actions In addition to getting a flu shot, pregnant women should take the same everyday preventive actions the CDC recommends of everyone, including covering coughs, washing hands often, and avoiding people who are sick.  Symptoms and Treatment If you get sick with flu symptoms call your doctor right away. There are antiviral drugs that can treat flu illness and prevent serious flu complications. The CDC recommends prompt treatment for people who have influenza infection or suspected influenza infection and who are at high risk of serious flu complications, such as people with asthma, diabetes (including gestational diabetes), or heart disease. Early treatment of influenza in hospitalized pregnant women has been shown to reduce the length of the hospital stay.  Symptoms Flu symptoms include fever, cough, sore throat, runny or stuffy nose, body aches, headache, chills and fatigue. Some people may also have vomiting and diarrhea. People may be infected with  the flu and have respiratory  symptoms without a fever.  Early Treatment is Important for Pregnant Women Treatment should begin as soon as possible because antiviral drugs work best when started early (within 48 hours after symptoms start). Antiviral drugs can make your flu illness milder and make you feel better faster. They may also prevent serious health problems that can result from flu illness. Oral oseltamivir (Tamiflu) is the preferred treatment for pregnant women because it has the most studies available to suggest that it is safe and beneficial. Antiviral drugs require a prescription from your provider. Having a fever caused by flu infection or other infections early in pregnancy may be linked to birth defects in a baby. In addition to taking antiviral drugs, pregnant women who get a fever should treat their fever with Tylenol (acetaminophen) and contact their provider immediately.  When to [place] If you are pregnant and have any of these signs, seek care immediately:  Difficulty breathing or shortness of breath  Pain or pressure in the chest or abdomen  Sudden dizziness  Confusion  Severe or persistent vomiting  High fever that is not responding to Tylenol (or store brand equivalent)  Decreased or no movement of your baby  [email or web address]

## 2019-07-11 ENCOUNTER — Other Ambulatory Visit: Payer: Self-pay | Admitting: Women's Health

## 2019-07-11 DIAGNOSIS — O0992 Supervision of high risk pregnancy, unspecified, second trimester: Secondary | ICD-10-CM

## 2019-07-11 DIAGNOSIS — I10 Essential (primary) hypertension: Secondary | ICD-10-CM

## 2019-07-11 DIAGNOSIS — O09299 Supervision of pregnancy with other poor reproductive or obstetric history, unspecified trimester: Secondary | ICD-10-CM

## 2019-07-12 ENCOUNTER — Other Ambulatory Visit: Payer: Self-pay

## 2019-07-12 ENCOUNTER — Ambulatory Visit (INDEPENDENT_AMBULATORY_CARE_PROVIDER_SITE_OTHER): Payer: Medicaid Other

## 2019-07-12 DIAGNOSIS — Z3A23 23 weeks gestation of pregnancy: Secondary | ICD-10-CM | POA: Diagnosis not present

## 2019-07-12 DIAGNOSIS — Z362 Encounter for other antenatal screening follow-up: Secondary | ICD-10-CM

## 2019-07-12 DIAGNOSIS — O0992 Supervision of high risk pregnancy, unspecified, second trimester: Secondary | ICD-10-CM | POA: Diagnosis not present

## 2019-07-12 DIAGNOSIS — O09292 Supervision of pregnancy with other poor reproductive or obstetric history, second trimester: Secondary | ICD-10-CM

## 2019-07-12 DIAGNOSIS — I10 Essential (primary) hypertension: Secondary | ICD-10-CM

## 2019-07-12 DIAGNOSIS — O10012 Pre-existing essential hypertension complicating pregnancy, second trimester: Secondary | ICD-10-CM | POA: Diagnosis not present

## 2019-07-12 DIAGNOSIS — O09299 Supervision of pregnancy with other poor reproductive or obstetric history, unspecified trimester: Secondary | ICD-10-CM

## 2019-07-12 NOTE — Progress Notes (Signed)
[place] TA/TV: 23+3 wks,transverse head left,CX length 3.8 cm w/and w/o pressure,posterior placenta gr 0,anterior fibroid N/C 6.6 x 4.8 x 2.8 cm,afi 19 cm,fhr 148 bpm,efw 672 g 77%

## 2019-07-18 ENCOUNTER — Ambulatory Visit (INDEPENDENT_AMBULATORY_CARE_PROVIDER_SITE_OTHER): Payer: Medicaid Other | Admitting: Nurse Practitioner

## 2019-08-02 ENCOUNTER — Other Ambulatory Visit: Payer: Self-pay | Admitting: Obstetrics & Gynecology

## 2019-08-02 DIAGNOSIS — O09292 Supervision of pregnancy with other poor reproductive or obstetric history, second trimester: Secondary | ICD-10-CM

## 2019-08-02 DIAGNOSIS — O10919 Unspecified pre-existing hypertension complicating pregnancy, unspecified trimester: Secondary | ICD-10-CM

## 2019-08-03 ENCOUNTER — Other Ambulatory Visit: Payer: Self-pay

## 2019-08-03 ENCOUNTER — Other Ambulatory Visit: Payer: Medicaid Other

## 2019-08-03 ENCOUNTER — Ambulatory Visit (INDEPENDENT_AMBULATORY_CARE_PROVIDER_SITE_OTHER): Payer: Medicaid Other | Admitting: Obstetrics & Gynecology

## 2019-08-03 ENCOUNTER — Encounter: Payer: Self-pay | Admitting: Obstetrics & Gynecology

## 2019-08-03 ENCOUNTER — Ambulatory Visit (INDEPENDENT_AMBULATORY_CARE_PROVIDER_SITE_OTHER): Payer: Medicaid Other

## 2019-08-03 VITALS — BP 123/75 | HR 93 | Wt 185.0 lb

## 2019-08-03 DIAGNOSIS — O0990 Supervision of high risk pregnancy, unspecified, unspecified trimester: Secondary | ICD-10-CM

## 2019-08-03 DIAGNOSIS — Z3A26 26 weeks gestation of pregnancy: Secondary | ICD-10-CM | POA: Diagnosis not present

## 2019-08-03 DIAGNOSIS — O09292 Supervision of pregnancy with other poor reproductive or obstetric history, second trimester: Secondary | ICD-10-CM

## 2019-08-03 DIAGNOSIS — Z131 Encounter for screening for diabetes mellitus: Secondary | ICD-10-CM | POA: Diagnosis not present

## 2019-08-03 DIAGNOSIS — O10412 Pre-existing secondary hypertension complicating pregnancy, second trimester: Secondary | ICD-10-CM

## 2019-08-03 DIAGNOSIS — Z1389 Encounter for screening for other disorder: Secondary | ICD-10-CM

## 2019-08-03 DIAGNOSIS — Z23 Encounter for immunization: Secondary | ICD-10-CM | POA: Diagnosis not present

## 2019-08-03 DIAGNOSIS — Z362 Encounter for other antenatal screening follow-up: Secondary | ICD-10-CM | POA: Diagnosis not present

## 2019-08-03 DIAGNOSIS — O09892 Supervision of other high risk pregnancies, second trimester: Secondary | ICD-10-CM

## 2019-08-03 DIAGNOSIS — I10 Essential (primary) hypertension: Secondary | ICD-10-CM

## 2019-08-03 DIAGNOSIS — Z331 Pregnant state, incidental: Secondary | ICD-10-CM

## 2019-08-03 DIAGNOSIS — O10919 Unspecified pre-existing hypertension complicating pregnancy, unspecified trimester: Secondary | ICD-10-CM

## 2019-08-03 LAB — POCT URINALYSIS DIPSTICK OB
Glucose, UA: NEGATIVE
Leukocytes, UA: NEGATIVE
Nitrite, UA: NEGATIVE

## 2019-08-03 MED ORDER — TERCONAZOLE 0.4 % VA CREA: 1.0000 | TOPICAL_CREAM | Freq: Every day | VAGINAL | 0 refills | Status: DC

## 2019-08-03 NOTE — Progress Notes (Signed)
[place] 26+4 wks,transverse,cx 3.6 cm,posterior placenta gr 0,normal ovaries,afi 17 cm,anterior fibroid 8.8 x 2.7 x 7.2 cm,fhr 150 bpm,EFW 1131 g 85%

## 2019-08-03 NOTE — Progress Notes (Addendum)
HIGH-RISK PREGNANCY VISIT Patient name: Tara Stark MRN [id number]  Date of birth: 1980-09-07 Chief Complaint:   Routine Prenatal Visit  History of Present Illness:   Tara Stark is a 39 y.o. G26P2002 female at [redacted]w[redacted]d with an Estimated Date of Delivery: 11/05/19 being seen today for ongoing management of a high-risk pregnancy complicated by chronic hypertension currently on norvasc 10 mg and hx o short cervix on prometrium 200 mg per vagina.  Today she reports no complaints. Contractions: Not present. Vag. Bleeding: None.  Movement: Present. denies leaking of fluid.  Review of Systems:   Pertinent items are noted in HPI Denies abnormal vaginal discharge w/ itching/odor/irritation, headaches, visual changes, shortness of breath, chest pain, abdominal pain, severe nausea/vomiting, or problems with urination or bowel movements unless otherwise stated above. Pertinent History Reviewed:  Reviewed past medical,surgical, social, obstetrical and family history.  Reviewed problem list, medications and allergies. Physical Assessment:   Vitals:   08/03/19 0934  BP: 123/75  Pulse: 93  Weight: 185 lb (83.9 kg)  Body mass index is 36.13 kg/m.           Physical Examination:   General appearance: alert, well appearing, and in no distress  Mental status: alert, oriented to person, place, and time  Skin: warm & dry   Extremities: Edema: None    Cardiovascular: normal heart rate noted  Respiratory: normal respiratory effort, no distress  Abdomen: gravid, soft, non-tender  Pelvic: Cervical exam deferred         Fetal Status: Fetal Heart Rate (bpm): 150 Fundal Height: 28 cm Movement: Present    Fetal Surveillance Testing today: sonogram with good growth   Chaperone: n/a    Results for orders placed or performed in visit on 08/03/19 (from the past 24 hour(s))  POC Urinalysis Dipstick OB   Collection Time: 08/03/19  9:50 AM  Result Value Ref Range   Color, UA     Clarity, UA     Glucose, UA Negative Negative   Bilirubin, UA     Ketones, UA large    Spec Grav, UA     Blood, UA moderate    pH, UA     POC,PROTEIN,UA Small (1+) Negative, Trace, Small (1+), Moderate (2+), Large (3+), 4+   Urobilinogen, UA     Nitrite, UA n    Leukocytes, UA Negative Negative   Appearance     Odor      Assessment & Plan:  1) High-risk pregnancy G3P2002 at [redacted]w[redacted]d with an Estimated Date of Delivery: 11/05/19   2) CHTN, stable, on Norvasc 10  3) short cervix, stable, on vaginal prometrium  Meds:  Meds ordered this encounter  Medications  . terconazole (TERAZOL 7) 0.4 % vaginal cream    Sig: Place 1 applicator vaginally at bedtime.    Dispense:  45 g    Refill:  0    Labs/procedures today: sonogram plus PN2  Treatment Plan:  Per chtn protocol  Reviewed: Preterm labor symptoms and general obstetric precautions including but not limited to vaginal bleeding, contractions, leaking of fluid and fetal movement were reviewed in detail with the patient.  All questions were answered. Has home bp cuff. Rx faxed to . Check bp weekly, let us know if >140/90.   Follow-up: Return in about 3 weeks (around 08/24/2019) for [place].  Orders Placed This Encounter  Procedures  . Tdap vaccine greater than or equal to 7yo IM  . Flu Vaccine QUAD 36+ mos IM  . POC  Urinalysis Dipstick OB   Tara Stark Tara Stark  08/03/2019 10:08 AM

## 2019-08-03 NOTE — Addendum Note (Signed)
Addended by: Florian Buff on: 08/03/2019 10:09 AM   Modules accepted: Orders

## 2019-08-04 LAB — CBC
Hematocrit: 35.4 % (ref 34.0–46.6)
Hemoglobin: 12.2 g/dL (ref 11.1–15.9)
MCH: 27.6 pg (ref 26.6–33.0)
MCHC: 34.5 g/dL (ref 31.5–35.7)
MCV: 80 fL (ref 79–97)
Platelets: 209 10*3/uL (ref 150–450)
RBC: 4.42 x10E6/uL (ref 3.77–5.28)
RDW: 15.2 % (ref 11.7–15.4)
WBC: 12.2 10*3/uL — ABNORMAL HIGH (ref 3.4–10.8)

## 2019-08-04 LAB — HIV ANTIBODY (ROUTINE TESTING W REFLEX): HIV Screen 4th Generation wRfx: NONREACTIVE

## 2019-08-04 LAB — GLUCOSE TOLERANCE, 2 HOURS W/ 1HR
Glucose, 1 hour: 142 mg/dL (ref 65–179)
Glucose, 2 hour: 153 mg/dL — ABNORMAL HIGH (ref 65–152)
Glucose, Fasting: 68 mg/dL (ref 65–91)

## 2019-08-04 LAB — RPR: RPR Ser Ql: NONREACTIVE

## 2019-08-04 LAB — ANTIBODY SCREEN: Antibody Screen: NEGATIVE

## 2019-08-05 ENCOUNTER — Encounter: Payer: Self-pay | Admitting: Internal Medicine

## 2019-08-24 ENCOUNTER — Encounter: Payer: Self-pay | Admitting: Family Medicine

## 2019-08-24 ENCOUNTER — Ambulatory Visit (INDEPENDENT_AMBULATORY_CARE_PROVIDER_SITE_OTHER): Payer: Medicaid Other | Admitting: Family Medicine

## 2019-08-24 ENCOUNTER — Other Ambulatory Visit: Payer: Self-pay

## 2019-08-24 VITALS — BP 138/85 | HR 109 | Wt 188.6 lb

## 2019-08-24 DIAGNOSIS — Z8759 Personal history of other complications of pregnancy, childbirth and the puerperium: Secondary | ICD-10-CM

## 2019-08-24 DIAGNOSIS — O0993 Supervision of high risk pregnancy, unspecified, third trimester: Secondary | ICD-10-CM

## 2019-08-24 DIAGNOSIS — Z3A29 29 weeks gestation of pregnancy: Secondary | ICD-10-CM

## 2019-08-24 DIAGNOSIS — O0990 Supervision of high risk pregnancy, unspecified, unspecified trimester: Secondary | ICD-10-CM

## 2019-08-24 DIAGNOSIS — O09892 Supervision of other high risk pregnancies, second trimester: Secondary | ICD-10-CM

## 2019-08-24 DIAGNOSIS — I10 Essential (primary) hypertension: Secondary | ICD-10-CM

## 2019-08-24 NOTE — Progress Notes (Signed)
    PRENATAL VISIT NOTE  Subjective:  Tara Stark is a 39 y.o. G3P2002 at [redacted]w[redacted]d being seen today for ongoing prenatal care.  She is currently monitored for the following issues for this high-risk pregnancy and has Smoker; History of cervical cerclage; History of prior pregnancy with IUGR newborn; Uterine fibroid; Vitamin D deficiency disease; Supervision of high risk pregnancy, antepartum; Short interval between pregnancies affecting pregnancy in second trimester, antepartum; and Chronic hypertension on their problem list.  Patient reports no complaints.  Contractions: Not present. Vag. Bleeding: None.  Movement: Present. Denies leaking of fluid.   The following portions of the patient's history were reviewed and updated as appropriate: allergies, current medications, past family history, past medical history, past social history, past surgical history and problem list. Problem list updated.  Objective:   Vitals:   08/24/19 1620  BP: 138/85  Pulse: (!) 109  Weight: 188 lb 9.6 oz (85.5 kg)    Fetal Status: Fetal Heart Rate (bpm): 145 Fundal Height: 29 cm Movement: Present     General:  Alert, oriented and cooperative. Patient is in no acute distress.  Skin: Skin is warm and dry. No rash noted.   Cardiovascular: Normal heart rate noted  Respiratory: Normal respiratory effort, no problems with respiration noted  Abdomen: Soft, gravid, appropriate for gestational age.  Pain/Pressure: Absent     Pelvic: Cervical exam deferred        Extremities: Normal range of motion.  Edema: None  Mental Status: Normal mood and affect. Normal behavior. Normal judgment and thought content.   Assessment and Plan:  Pregnancy: G3P2002 at [redacted]w[redacted]d  1. 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 Chronic hypertension - Continue amlodipine - US FETAL BPP W/NONSTRESS; Future   Preterm labor  symptoms and general obstetric precautions including but not limited to vaginal bleeding, contractions, leaking of fluid and fetal movement were reviewed in detail with the patient. Please refer to After Visit Summary for other counseling recommendations.   Return in about 2 weeks (around 09/07/2019) for Routine prenatal care, in person.  Future Appointments  Date Time Provider [place]  09/07/2019  3:30 PM Christin Fudge, CNM [facility]  09/13/2019  3:30 PM [place] - [facility] [place] [facility] None  09/13/2019  4:10 PM Myrtis Ser, CNM [facility]    Caren Macadam, MD

## 2019-08-28 ENCOUNTER — Telehealth: Payer: Self-pay | Admitting: *Deleted

## 2019-08-28 NOTE — Telephone Encounter (Signed)
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 She is concerned about what she needs to do.  Informed patient that since it was not direct exposure to her, she would not need to be tested at this time unless she developed symptoms.  Informed the health dept would also give their daycare guidelines on what to do as well. Pt verbalized understanding with no further questions.

## 2019-08-29 ENCOUNTER — Other Ambulatory Visit: Payer: Medicaid Other

## 2019-09-04 ENCOUNTER — Telehealth: Payer: Self-pay | Admitting: *Deleted

## 2019-09-04 NOTE — Telephone Encounter (Signed)
LMOVM that since she was not in direct exposure of the parent, then she does not have to do anything different.  The child should not be at the daycare either.  Informed the health department should be contacted regarding the exposures to give them guidance.  Only needs to be tested if she is having symptoms.

## 2019-09-04 NOTE — Telephone Encounter (Signed)
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 She has been around the child. She wants to know what she should do?

## 2019-09-07 ENCOUNTER — Other Ambulatory Visit: Payer: Self-pay

## 2019-09-07 ENCOUNTER — Ambulatory Visit (INDEPENDENT_AMBULATORY_CARE_PROVIDER_SITE_OTHER): Payer: Medicaid Other | Admitting: Advanced Practice Midwife

## 2019-09-07 VITALS — BP 139/87 | HR 98 | Wt 189.0 lb

## 2019-09-07 DIAGNOSIS — Z1389 Encounter for screening for other disorder: Secondary | ICD-10-CM

## 2019-09-07 DIAGNOSIS — O0990 Supervision of high risk pregnancy, unspecified, unspecified trimester: Secondary | ICD-10-CM

## 2019-09-07 DIAGNOSIS — Z3A31 31 weeks gestation of pregnancy: Secondary | ICD-10-CM

## 2019-09-07 DIAGNOSIS — O0993 Supervision of high risk pregnancy, unspecified, third trimester: Secondary | ICD-10-CM

## 2019-09-07 DIAGNOSIS — I10 Essential (primary) hypertension: Secondary | ICD-10-CM

## 2019-09-07 DIAGNOSIS — Z331 Pregnant state, incidental: Secondary | ICD-10-CM

## 2019-09-07 LAB — POCT URINALYSIS DIPSTICK OB
Blood, UA: NEGATIVE
Glucose, UA: NEGATIVE
Ketones, UA: NEGATIVE
Leukocytes, UA: NEGATIVE
Nitrite, UA: NEGATIVE
POC,PROTEIN,UA: NEGATIVE

## 2019-09-07 NOTE — Progress Notes (Signed)
   [place] PREGNANCY VISIT Patient name: Tara Stark MRN [id number]  Date of birth: 1981/03/18 Chief Complaint:   Routine Prenatal Visit  History of Present Illness:   Tara Stark is a 39 y.o. G9P2002 female at [redacted]w[redacted]d with an Estimated Date of Delivery: 11/05/19 being seen today for ongoing management of a high-risk pregnancy complicated by [facility] currently on Labetalol Today she reports pressure. Contractions: Not present. Vag. Bleeding: None.  Movement: Present. denies leaking of fluid.  Review of Systems:   Pertinent items are noted in HPI Denies abnormal vaginal discharge w/ itching/odor/irritation, headaches, visual changes, shortness of breath, chest pain, abdominal pain, severe nausea/vomiting, or problems with urination or bowel movements unless otherwise stated above. Pertinent History Reviewed:  Reviewed past medical,surgical, social, obstetrical and family history.  Reviewed problem list, medications and allergies. Physical Assessment:   Vitals:   09/07/19 1542  BP: 139/87  Pulse: 98  Weight: 189 lb (85.7 kg)  Body mass index is 36.91 kg/m.           Physical Examination:   General appearance: alert, well appearing, and in no distress  Mental status: alert, oriented to person, place, and time  Skin: warm & dry   Extremities: Edema: None    Cardiovascular: normal heart rate noted  Respiratory: normal respiratory effort, no distress  Abdomen: gravid, soft, non-tender  Pelvic: requested:  Dilation: Closed Effacement (%): Thick Station: Ballotable  Fetal Status: Fetal Heart Rate (bpm): 149 Fundal Height: 31 cm Movement: Present    Fetal Surveillance Testing today: doppler   Results for orders placed or performed in visit on 09/07/19 (from the past 24 hour(s))  POC Urinalysis Dipstick OB   Collection Time: 09/07/19  3:46 PM  Result Value Ref Range   Color, UA     Clarity, UA     Glucose, UA Negative Negative   Bilirubin, UA     Ketones, UA neg    Spec Grav,  UA     Blood, UA neg    pH, UA     POC,PROTEIN,UA Negative Negative, Trace, Small (1+), Moderate (2+), Large (3+), 4+   Urobilinogen, UA     Nitrite, UA neg    Leukocytes, UA Negative Negative   Appearance     Odor      Assessment & Plan:  1) High-risk pregnancy G3P2002 at [redacted]w[redacted]d with an Estimated Date of Delivery: 11/05/19   2) CHTN, stable Treatment plan  Twice weekly testing starting @ 32 weeks  Meds: No orders of the defined types were placed in this encounter.   Labs/procedures today:    Reviewed: Preterm labor symptoms and general obstetric precautions including but not limited to vaginal bleeding, contractions, leaking of fluid and fetal movement were reviewed in detail with the patient.  All questions were answered. Has home bp cuff.  Follow-up: Start 2/23:  NST w/RN Mondays  BPP/HROB on Thursdays  Future Appointments  Date Time Provider [place]  09/13/2019  3:30 PM [facility] [place] [facility] None  09/13/2019  4:10 PM Myrtis Ser, CNM [facility]    Orders Placed This Encounter  Procedures  . POC Urinalysis Dipstick OB   Christin Fudge DNP, CNM 09/07/2019 3:56 PM

## 2019-09-07 NOTE — Patient Instructions (Signed)
Tara Stark, I greatly value your feedback.  If you receive a survey following your visit with [place] today, we appreciate you taking the time to fill it out.  Thanks, Nigel Berthold, DNP, CNM  [place]!!! It is now [place] at [facility] ([place], [place] [address]) Entrance located off of [place] parking   Go to [email or web address] to register for FREE online childbirth classes    Call the office [phone]) or go to [place] if:  You begin to have strong, frequent contractions  Your water breaks.  Sometimes it is a big gush of fluid, sometimes it is just a trickle that keeps getting your panties wet or running down your legs  You have vaginal bleeding.  It is normal to have a small amount of spotting if your cervix was checked.   You don't feel your baby moving like normal.  If you don't, get you something to eat and drink and lay down and focus on feeling your baby move.  You should feel at least 10 movements in 2 hours.  If you don't, you should call the office or go to [place] Blood Pressure Monitoring for Patients   Your provider has recommended that you check your blood pressure (BP) at least once a week at home. If you do not have a blood pressure cuff at home, one will be provided for you. Contact your provider if you have not received your monitor within 1 week.   Helpful Tips for Accurate Home Blood Pressure Checks  . Don't smoke, exercise, or drink caffeine 30 minutes before checking your BP . Use the restroom before checking your BP (a full bladder can raise your pressure) . Relax in a comfortable upright chair . Feet on the ground . Left arm resting comfortably on a flat surface at the level of your heart . Legs uncrossed . Back supported . Sit quietly and don't talk . Place the cuff on your bare arm . Adjust snuggly, so that only two fingertips can fit  between your skin and the top of the cuff . Check 2 readings separated by at least one minute . Keep a log of your BP readings . For a visual, please reference this diagram: http://ccnc.care/bpdiagram  Provider Name: [facility]     Phone: [phone]  Zone 1: ALL CLEAR  Continue to monitor your symptoms:  . BP reading is less than 140 (top number) or less than 90 (bottom number)  . No right upper stomach pain . No headaches or seeing spots . No feeling nauseated or throwing up . No swelling in face and hands  Zone 2: CAUTION Call your doctor's office for any of the following:  . BP reading is greater than 140 (top number) or greater than 90 (bottom number)  . Stomach pain under your ribs in the middle or right side . Headaches or seeing spots . Feeling nauseated or throwing up . Swelling in face and hands  Zone 3: EMERGENCY  Seek immediate medical care if you have any of the following:  . BP reading is greater than160 (top number) or greater than 110 (bottom number) . Severe headaches not improving with Tylenol . Serious difficulty catching your breath . Any worsening symptoms from Zone 2

## 2019-09-12 ENCOUNTER — Other Ambulatory Visit: Payer: Self-pay | Admitting: Obstetrics & Gynecology

## 2019-09-12 ENCOUNTER — Other Ambulatory Visit: Payer: Self-pay | Admitting: Advanced Practice Midwife

## 2019-09-12 ENCOUNTER — Other Ambulatory Visit: Payer: Self-pay | Admitting: Family Medicine

## 2019-09-12 DIAGNOSIS — O0990 Supervision of high risk pregnancy, unspecified, unspecified trimester: Secondary | ICD-10-CM

## 2019-09-12 DIAGNOSIS — O10919 Unspecified pre-existing hypertension complicating pregnancy, unspecified trimester: Secondary | ICD-10-CM

## 2019-09-12 DIAGNOSIS — I10 Essential (primary) hypertension: Secondary | ICD-10-CM

## 2019-09-13 ENCOUNTER — Other Ambulatory Visit: Payer: Self-pay | Admitting: Advanced Practice Midwife

## 2019-09-13 ENCOUNTER — Ambulatory Visit (INDEPENDENT_AMBULATORY_CARE_PROVIDER_SITE_OTHER): Payer: Medicaid Other | Admitting: Advanced Practice Midwife

## 2019-09-13 ENCOUNTER — Other Ambulatory Visit: Payer: Self-pay

## 2019-09-13 ENCOUNTER — Ambulatory Visit (INDEPENDENT_AMBULATORY_CARE_PROVIDER_SITE_OTHER): Payer: Medicaid Other

## 2019-09-13 DIAGNOSIS — Z362 Encounter for other antenatal screening follow-up: Secondary | ICD-10-CM | POA: Diagnosis not present

## 2019-09-13 DIAGNOSIS — O10913 Unspecified pre-existing hypertension complicating pregnancy, third trimester: Secondary | ICD-10-CM

## 2019-09-13 DIAGNOSIS — I10 Essential (primary) hypertension: Secondary | ICD-10-CM

## 2019-09-13 DIAGNOSIS — Z3A32 32 weeks gestation of pregnancy: Secondary | ICD-10-CM | POA: Diagnosis not present

## 2019-09-13 DIAGNOSIS — Z0375 Encounter for suspected cervical shortening ruled out: Secondary | ICD-10-CM | POA: Diagnosis not present

## 2019-09-13 DIAGNOSIS — O0993 Supervision of high risk pregnancy, unspecified, third trimester: Secondary | ICD-10-CM

## 2019-09-13 DIAGNOSIS — O10919 Unspecified pre-existing hypertension complicating pregnancy, unspecified trimester: Secondary | ICD-10-CM

## 2019-09-13 DIAGNOSIS — O09892 Supervision of other high risk pregnancies, second trimester: Secondary | ICD-10-CM

## 2019-09-13 DIAGNOSIS — O0990 Supervision of high risk pregnancy, unspecified, unspecified trimester: Secondary | ICD-10-CM

## 2019-09-13 NOTE — Progress Notes (Signed)
[place] 32+3 wks,transverse head right,cervical funneling on T/A images,TV no funneling visualized,cervical length 2.3-2.7 cm with and w/o pressure,nuchal cord,posterior fundal placenta gr 0,afi 16.6 cm,RI .55,.56,.53,.56=21%,EFW 2298 g 83%,AC 95%,HC 91%,FHR 154 BPM,,BPP 8/8,discussed results with [name]

## 2019-09-13 NOTE — Patient Instructions (Signed)
Tara Stark, I greatly value your feedback.  If you receive a survey following your visit with [place] today, we appreciate you taking the time to fill it out.  Thanks, [name], [name] It is now [name] at [facility] ([place], [place] [address]) Entrance located off of [address] parking    Go to [email or web address] to register for FREE online childbirth classes   Call the office [phone]) or go to [facility] if:  You begin to have strong, frequent contractions  Your water breaks.  Sometimes it is a big gush of fluid, sometimes it is just a trickle that keeps getting your panties wet or running down your legs  You have vaginal bleeding.  It is normal to have a small amount of spotting if your cervix was checked.   You don't feel your baby moving like normal.  If you don't, get you something to eat and drink and lay down and focus on feeling your baby move.  You should feel at least 10 movements in 2 hours.  If you don't, you should call the office or go to [facility].    Tdap Vaccine  It is recommended that you get the Tdap vaccine during the third trimester of EACH pregnancy to help protect your baby from getting pertussis (whooping cough)  27-36 weeks is the BEST time to do this so that you can pass the protection on to your baby. During pregnancy is better than after pregnancy, but if you are unable to get it during pregnancy it will be offered at the hospital.   You can get this vaccine with [place], at the health department, your family doctor, or some local pharmacies  Everyone who will be around your baby should also be up-to-date on their vaccines before the baby comes. Adults (who are not pregnant) only need 1 dose of Tdap during adulthood.   [place] Pediatricians/Family Doctors:  [facility] [phone]                  [place] [phone] (usually not accepting new patients unless you have family there already, you are always welcome to call and ask)       [facility] Department [phone]       [facility] Pediatricians/Family Doctors:   [facility]: [phone]  [facility]: [phone]  [facility]: [place] Doctors:   [facility]: [facility]: [place]:  [place]: [phone]   Home Blood Pressure Monitoring for Patients   Your provider has recommended that you check your blood pressure (BP) at least once a week at home. If you do not have a blood pressure cuff at home, one will be provided for you. Contact your provider if you have not received your monitor within 1 week.   Helpful Tips for Accurate Home Blood Pressure Checks  . Don't smoke, exercise, or drink caffeine 30 minutes before checking your BP . Use the restroom before checking your BP (a full bladder can raise your pressure) . Relax in a comfortable upright chair . Feet on the ground . Left arm resting comfortably on a flat surface at the level of your heart . Legs uncrossed . Back supported . Sit quietly and don't talk .  Place the cuff on your bare arm . Adjust snuggly, so that only two fingertips can fit between your skin and the top of the cuff . Check 2 readings separated by at least one minute . Keep a log of your BP readings . For a visual, please reference this diagram: http://ccnc.care/bpdiagram  Provider Name: [facility]     Phone: [phone]  Zone 1: ALL CLEAR  Continue to monitor your symptoms:  . BP reading is less than 140 (top number) or less than 90 (bottom number)  . No right upper stomach pain . No headaches or seeing spots . No feeling nauseated or throwing up . No swelling in face and  hands  Zone 2: CAUTION Call your doctor's office for any of the following:  . BP reading is greater than 140 (top number) or greater than 90 (bottom number)  . Stomach pain under your ribs in the middle or right side . Headaches or seeing spots . Feeling nauseated or throwing up . Swelling in face and hands  Zone 3: EMERGENCY  Seek immediate medical care if you have any of the following:  . BP reading is greater than160 (top number) or greater than 110 (bottom number) . Severe headaches not improving with Tylenol . Serious difficulty catching your breath . Any worsening symptoms from Zone 2   Third Trimester of Pregnancy The third trimester is from week 29 through week 42, months 7 through 9. The third trimester is a time when the fetus is growing rapidly. At the end of the ninth month, the fetus is about 20 inches in length and weighs 6-10 pounds.  BODY CHANGES Your body goes through many changes during pregnancy. The changes vary from woman to woman.   Your weight will continue to increase. You can expect to gain 25-35 pounds (11-16 kg) by the end of the pregnancy.  You may begin to get stretch marks on your hips, abdomen, and breasts.  You may urinate more often because the fetus is moving lower into your pelvis and pressing on your bladder.  You may develop or continue to have heartburn as a result of your pregnancy.  You may develop constipation because certain hormones are causing the muscles that push waste through your intestines to slow down.  You may develop hemorrhoids or swollen, bulging veins (varicose veins).  You may have pelvic pain because of the weight gain and pregnancy hormones relaxing your joints between the bones in your pelvis. Backaches may result from overexertion of the muscles supporting your posture.  You may have changes in your hair. These can include thickening of your hair, rapid growth, and changes in texture. Some women also have hair loss during  or after pregnancy, or hair that feels dry or thin. Your hair will most likely return to normal after your baby is born.  Your breasts will continue to grow and be tender. A yellow discharge may leak from your breasts called colostrum.  Your belly button may stick out.  You may feel short of breath because of your expanding uterus.  You may notice the fetus "dropping," or moving lower in your abdomen.  You may have a bloody mucus discharge. This usually occurs a few days to a week before labor begins.  Your cervix becomes thin and soft (effaced) near your due date. WHAT TO EXPECT AT YOUR PRENATAL EXAMS  You will have prenatal exams every 2 weeks until week 36. Then, you will have weekly prenatal exams. During  a routine prenatal visit:  You will be weighed to make sure you and the fetus are growing normally.  Your blood pressure is taken.  Your abdomen will be measured to track your baby's growth.  The fetal heartbeat will be listened to.  Any test results from the previous visit will be discussed.  You may have a cervical check near your due date to see if you have effaced. At around 36 weeks, your caregiver will check your cervix. At the same time, your caregiver will also perform a test on the secretions of the vaginal tissue. This test is to determine if a type of bacteria, Group B streptococcus, is present. Your caregiver will explain this further. Your caregiver may ask you:  What your birth plan is.  How you are feeling.  If you are feeling the baby move.  If you have had any abnormal symptoms, such as leaking fluid, bleeding, severe headaches, or abdominal cramping.  If you have any questions. Other tests or screenings that may be performed during your third trimester include:  Blood tests that check for low iron levels (anemia).  Fetal testing to check the health, activity level, and growth of the fetus. Testing is done if you have certain medical conditions or if  there are problems during the pregnancy. FALSE LABOR You may feel small, irregular contractions that eventually go away. These are called Braxton Hicks contractions, or false labor. Contractions may last for hours, days, or even weeks before true labor sets in. If contractions come at regular intervals, intensify, or become painful, it is best to be seen by your caregiver.  SIGNS OF LABOR   Menstrual-like cramps.  Contractions that are 5 minutes apart or less.  Contractions that start on the top of the uterus and spread down to the lower abdomen and back.  A sense of increased pelvic pressure or back pain.  A watery or bloody mucus discharge that comes from the vagina. If you have any of these signs before the 37th week of pregnancy, call your caregiver right away. You need to go to the hospital to get checked immediately. HOME CARE INSTRUCTIONS   Avoid all smoking, herbs, alcohol, and unprescribed drugs. These chemicals affect the formation and growth of the baby.  Follow your caregiver's instructions regarding medicine use. There are medicines that are either safe or unsafe to take during pregnancy.  Exercise only as directed by your caregiver. Experiencing uterine cramps is a good sign to stop exercising.  Continue to eat regular, healthy meals.  Wear a good support bra for breast tenderness.  Do not use hot tubs, steam rooms, or saunas.  Wear your seat belt at all times when driving.  Avoid raw meat, uncooked cheese, cat litter boxes, and soil used by cats. These carry germs that can cause birth defects in the baby.  Take your prenatal vitamins.  Try taking a stool softener (if your caregiver approves) if you develop constipation. Eat more high-fiber foods, such as fresh vegetables or fruit and whole grains. Drink plenty of fluids to keep your urine clear or pale yellow.  Take warm sitz baths to soothe any pain or discomfort caused by hemorrhoids. Use hemorrhoid cream if your  caregiver approves.  If you develop varicose veins, wear support hose. Elevate your feet for 15 minutes, 3-4 times a day. Limit salt in your diet.  Avoid heavy lifting, wear low heal shoes, and practice good posture.  Rest a lot with your legs elevated if you  have leg cramps or low back pain.  Visit your dentist if you have not gone during your pregnancy. Use a soft toothbrush to brush your teeth and be gentle when you floss.  A sexual relationship may be continued unless your caregiver directs you otherwise.  Do not travel far distances unless it is absolutely necessary and only with the approval of your caregiver.  Take prenatal classes to understand, practice, and ask questions about the labor and delivery.  Make a trial run to the hospital.  Pack your hospital bag.  Prepare the baby's nursery.  Continue to go to all your prenatal visits as directed by your caregiver. SEEK MEDICAL CARE IF:  You are unsure if you are in labor or if your water has broken.  You have dizziness.  You have mild pelvic cramps, pelvic pressure, or nagging pain in your abdominal area.  You have persistent nausea, vomiting, or diarrhea.  You have a bad smelling vaginal discharge.  You have pain with urination. SEEK IMMEDIATE MEDICAL CARE IF:   You have a fever.  You are leaking fluid from your vagina.  You have spotting or bleeding from your vagina.  You have severe abdominal cramping or pain.  You have rapid weight loss or gain.  You have shortness of breath with chest pain.  You notice sudden or extreme swelling of your face, hands, ankles, feet, or legs.  You have not felt your baby move in over an hour.  You have severe headaches that do not go away with medicine.  You have vision changes. Document Released: 07/07/2001 Document Revised: 07/18/2013 Document Reviewed: 09/13/2012 [facility] Patient Information 2015 [place], [place]. This information is not intended to replace advice  given to you by your health care provider. Make sure you discuss any questions you have with your health care provider.  PROTECT YOURSELF & YOUR BABY FROM THE FLU! Because you are pregnant, we at [facility], along with the Centers for Disease Control (CDC), recommend that you receive the flu vaccine to protect yourself and your baby from the flu. The flu is more likely to cause severe illness in pregnant women than in women of reproductive age who are not pregnant. Changes in the immune system, heart, and lungs during pregnancy make pregnant women (and women up to two weeks postpartum) more prone to severe illness from flu, including illness resulting in hospitalization. Flu also may be harmful for a pregnant woman's developing baby. A common flu symptom is fever, which may be associated with neural tube defects and other adverse outcomes for a developing baby. Getting vaccinated can also help protect a baby after birth from flu. (Mom passes antibodies onto the developing baby during her pregnancy.)  A Flu Vaccine is the Best Protection Against Flu Getting a flu vaccine is the first and most important step in protecting against flu. Pregnant women should get a flu shot and not the live attenuated influenza vaccine (LAIV), also known as nasal spray flu vaccine. Flu vaccines given during pregnancy help protect both the mother and her baby from flu. Vaccination has been shown to reduce the risk of flu-associated acute respiratory infection in pregnant women by up to one-half. A 2018 study showed that getting a flu shot reduced a pregnant woman's risk of being hospitalized with flu by an average of 40 percent. Pregnant women who get a flu vaccine are also helping to protect their babies from flu illness for the first several months after their birth, when they are too  young to get vaccinated.   A Long Record of Safety for Flu Shots in Pregnant Women Flu shots have been given to millions of pregnant women over  many years with a good safety record. There is a lot of evidence that flu vaccines can be given safely during pregnancy; though these data are limited for the first trimester. The CDC recommends that pregnant women get vaccinated during any trimester of their pregnancy. It is very important for pregnant women to get the flu shot.   Other Preventive Actions In addition to getting a flu shot, pregnant women should take the same everyday preventive actions the CDC recommends of everyone, including covering coughs, washing hands often, and avoiding people who are sick.  Symptoms and Treatment If you get sick with flu symptoms call your doctor right away. There are antiviral drugs that can treat flu illness and prevent serious flu complications. The CDC recommends prompt treatment for people who have influenza infection or suspected influenza infection and who are at high risk of serious flu complications, such as people with asthma, diabetes (including gestational diabetes), or heart disease. Early treatment of influenza in hospitalized pregnant women has been shown to reduce the length of the hospital stay.  Symptoms Flu symptoms include fever, cough, sore throat, runny or stuffy nose, body aches, headache, chills and fatigue. Some people may also have vomiting and diarrhea. People may be infected with the flu and have respiratory symptoms without a fever.  Early Treatment is Important for Pregnant Women Treatment should begin as soon as possible because antiviral drugs work best when started early (within 48 hours after symptoms start). Antiviral drugs can make your flu illness milder and make you feel better faster. They may also prevent serious health problems that can result from flu illness. Oral oseltamivir (Tamiflu) is the preferred treatment for pregnant women because it has the most studies available to suggest that it is safe and beneficial. Antiviral drugs require a prescription from your  provider. Having a fever caused by flu infection or other infections early in pregnancy may be linked to birth defects in a baby. In addition to taking antiviral drugs, pregnant women who get a fever should treat their fever with Tylenol (acetaminophen) and contact their provider immediately.  When to [place] If you are pregnant and have any of these signs, seek care immediately:  Difficulty breathing or shortness of breath  Pain or pressure in the chest or abdomen  Sudden dizziness  Confusion  Severe or persistent vomiting  High fever that is not responding to Tylenol (or store brand equivalent)  Decreased or no movement of your baby  [email or web address]

## 2019-09-13 NOTE — Progress Notes (Signed)
   HIGH-RISK PREGNANCY VISIT Patient name: Tara Stark MRN [id number]  Date of birth: 09/17/1980 Chief Complaint:   Routine Prenatal Visit  History of Present Illness:   Tara Stark is a 39 y.o. G74P2002 female at [redacted]w[redacted]d with an Estimated Date of Delivery: 11/05/19 being seen today for ongoing management of a high-risk pregnancy complicated by chronic hypertension currently on Norvasc 10mg ; also with short cx using Prometrium PV q hs.  Today she reports sees floaters when she moves quickly. Contractions: Not present. Vag. Bleeding: None.  Movement: Present. denies leaking of fluid.  Review of Systems:   Pertinent items are noted in HPI Denies abnormal vaginal discharge w/ itching/odor/irritation, headaches, visual changes, shortness of breath, chest pain, abdominal pain, severe nausea/vomiting, or problems with urination or bowel movements unless otherwise stated above. Pertinent History Reviewed:  Reviewed past medical,surgical, social, obstetrical and family history.  Reviewed problem list, medications and allergies. Physical Assessment:   Vitals:   09/13/19 1619  BP: 124/78  Pulse: 94  Weight: 190 lb (86.2 kg)  Body mass index is 37.11 kg/m.           Physical Examination:   General appearance: alert, well appearing, and in no distress  Mental status: alert, oriented to person, place, and time  Skin: warm & dry   Extremities: Edema: None    Cardiovascular: normal heart rate noted  Respiratory: normal respiratory effort, no distress  Abdomen: gravid, soft, non-tender  Pelvic: Cervical exam deferred         Fetal Status: Fetal Heart Rate (bpm): 154 u/s   Movement: Present    Fetal Surveillance Testing today: [place] 32+3 wks,transverse head right,cervical funneling on T/A images,TV no funneling visualized,cervical length 2.3-2.7 cm with and w/o pressure,nuchal cord,posterior fundal placenta gr 0,afi 16.6 cm,RI .55,.56,.53,.56=21%,EFW 2298 g 83%,AC 95%,HC 91%,FHR 154 BPM,,BPP  8/8,discussed results with [name]    No results found for this or any previous visit (from the past 24 hour(s)).  Assessment & Plan:  1) High-risk pregnancy G3P2002 at [redacted]w[redacted]d with an Estimated Date of Delivery: 11/05/19   2) cHTN, stable on Norvasc 10mg ; beginning twice weekly ante testing next week (Tues/Fri)  3) Hx short cx, measured 2.7-2.3cm with fundal pressure, continue Prometrium pv  4) Transverse position, will continue to follow  5) Desires sterilization, will sign BTL papers today  Meds: No orders of the defined types were placed in this encounter.   Labs/procedures today: none  Treatment Plan:  Ante testing with IOL at 37-39wks, depending on control  Reviewed: Preterm labor symptoms and general obstetric precautions including but not limited to vaginal bleeding, contractions, leaking of fluid and fetal movement were reviewed in detail with the patient.  All questions were answered. Has home bp cuff. Check bp weekly, let us know if >140/90.   Follow-up: Return for Sign BTL consent today.  No orders of the defined types were placed in this encounter.  Myrtis Ser CNM 09/13/2019 4:44 PM

## 2019-09-15 ENCOUNTER — Encounter: Payer: Self-pay | Admitting: Obstetrics and Gynecology

## 2019-09-15 ENCOUNTER — Telehealth: Payer: Self-pay | Admitting: *Deleted

## 2019-09-15 NOTE — Telephone Encounter (Signed)
Pt has been exposed to Covid. She works at a daycare. Pt did have mask on. Pt has a scratchy throat. Pt's son has congestion and sneezing. He has an appt to see dr today. He will be tested because he has been around child that has tested positive. Advised pt to wait 5 days from the positive contact and get tested. Pt voiced understanding. [place]

## 2019-09-15 NOTE — Telephone Encounter (Signed)
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 She wants to know what she should do.

## 2019-09-18 ENCOUNTER — Other Ambulatory Visit: Payer: Self-pay

## 2019-09-18 ENCOUNTER — Ambulatory Visit: Payer: Medicaid Other | Attending: Internal Medicine

## 2019-09-18 ENCOUNTER — Other Ambulatory Visit: Payer: Medicaid Other

## 2019-09-18 DIAGNOSIS — Z20822 Contact with and (suspected) exposure to covid-19: Secondary | ICD-10-CM

## 2019-09-19 ENCOUNTER — Other Ambulatory Visit: Payer: Self-pay

## 2019-09-19 ENCOUNTER — Ambulatory Visit (INDEPENDENT_AMBULATORY_CARE_PROVIDER_SITE_OTHER): Payer: Medicaid Other | Admitting: *Deleted

## 2019-09-19 VITALS — BP 138/87 | HR 115 | Wt 190.0 lb

## 2019-09-19 DIAGNOSIS — I10 Essential (primary) hypertension: Secondary | ICD-10-CM

## 2019-09-19 DIAGNOSIS — O0993 Supervision of high risk pregnancy, unspecified, third trimester: Secondary | ICD-10-CM

## 2019-09-19 DIAGNOSIS — Z331 Pregnant state, incidental: Secondary | ICD-10-CM

## 2019-09-19 DIAGNOSIS — Z1389 Encounter for screening for other disorder: Secondary | ICD-10-CM

## 2019-09-19 DIAGNOSIS — Z3A33 33 weeks gestation of pregnancy: Secondary | ICD-10-CM | POA: Diagnosis not present

## 2019-09-19 DIAGNOSIS — O0990 Supervision of high risk pregnancy, unspecified, unspecified trimester: Secondary | ICD-10-CM

## 2019-09-19 LAB — NOVEL CORONAVIRUS, NAA: SARS-CoV-2, NAA: NOT DETECTED

## 2019-09-19 LAB — POCT URINALYSIS DIPSTICK OB
Blood, UA: NEGATIVE
Glucose, UA: NEGATIVE
Ketones, UA: NEGATIVE
Leukocytes, UA: NEGATIVE
Nitrite, UA: NEGATIVE
POC,PROTEIN,UA: NEGATIVE

## 2019-09-19 NOTE — Progress Notes (Signed)
   NURSE VISIT- NST  SUBJECTIVE:  Tara Stark is a 39 y.o. G42P2002 female at [redacted]w[redacted]d, here for a NST for pregnancy complicated by [facility] and FGR.  She reports active fetal movement, contractions: none, vaginal bleeding: none, membranes: intact.   OBJECTIVE:  BP 138/87   Pulse (!) 115   Wt 190 lb (86.2 kg)   LMP 01/14/2019   BMI 37.11 kg/m   Appears well, no apparent distress  Results for orders placed or performed in visit on 09/19/19 (from the past 24 hour(s))  POC Urinalysis Dipstick OB   Collection Time: 09/19/19  2:44 PM  Result Value Ref Range   Color, UA     Clarity, UA     Glucose, UA Negative Negative   Bilirubin, UA     Ketones, UA neg    Spec Grav, UA     Blood, UA neg    pH, UA     POC,PROTEIN,UA Negative Negative, Trace, Small (1+), Moderate (2+), Large (3+), 4+   Urobilinogen, UA     Nitrite, UA neg    Leukocytes, UA Negative Negative   Appearance     Odor      NST: FHR baseline 135 bpm, Variability: moderate, Accelerations:present, Decelerations:  Absent= Cat 1/Reactive Toco: none   ASSESSMENT: G3P2002 at [redacted]w[redacted]d with CHTN and FGR NST reactive  PLAN: EFM strip reviewed by Dr. Glo Herring   Recommendations: keep next appointment as scheduled    Jayvan Mcshan, [place]b  09/19/2019 2:51 PM

## 2019-09-21 ENCOUNTER — Other Ambulatory Visit: Payer: Self-pay | Admitting: Advanced Practice Midwife

## 2019-09-21 DIAGNOSIS — O10919 Unspecified pre-existing hypertension complicating pregnancy, unspecified trimester: Secondary | ICD-10-CM

## 2019-09-22 ENCOUNTER — Ambulatory Visit (INDEPENDENT_AMBULATORY_CARE_PROVIDER_SITE_OTHER): Payer: Medicaid Other | Admitting: Advanced Practice Midwife

## 2019-09-22 ENCOUNTER — Other Ambulatory Visit: Payer: Self-pay

## 2019-09-22 ENCOUNTER — Ambulatory Visit (INDEPENDENT_AMBULATORY_CARE_PROVIDER_SITE_OTHER): Payer: Medicaid Other

## 2019-09-22 VITALS — BP 123/74 | HR 86 | Wt 189.0 lb

## 2019-09-22 DIAGNOSIS — N898 Other specified noninflammatory disorders of vagina: Secondary | ICD-10-CM | POA: Diagnosis not present

## 2019-09-22 DIAGNOSIS — I10 Essential (primary) hypertension: Secondary | ICD-10-CM

## 2019-09-22 DIAGNOSIS — O0990 Supervision of high risk pregnancy, unspecified, unspecified trimester: Secondary | ICD-10-CM

## 2019-09-22 DIAGNOSIS — O26893 Other specified pregnancy related conditions, third trimester: Secondary | ICD-10-CM | POA: Diagnosis not present

## 2019-09-22 DIAGNOSIS — O09293 Supervision of pregnancy with other poor reproductive or obstetric history, third trimester: Secondary | ICD-10-CM

## 2019-09-22 DIAGNOSIS — Z3A35 35 weeks gestation of pregnancy: Secondary | ICD-10-CM

## 2019-09-22 DIAGNOSIS — O10913 Unspecified pre-existing hypertension complicating pregnancy, third trimester: Secondary | ICD-10-CM

## 2019-09-22 DIAGNOSIS — Z3A33 33 weeks gestation of pregnancy: Secondary | ICD-10-CM | POA: Diagnosis not present

## 2019-09-22 DIAGNOSIS — Z8759 Personal history of other complications of pregnancy, childbirth and the puerperium: Secondary | ICD-10-CM

## 2019-09-22 DIAGNOSIS — O09892 Supervision of other high risk pregnancies, second trimester: Secondary | ICD-10-CM

## 2019-09-22 DIAGNOSIS — O10919 Unspecified pre-existing hypertension complicating pregnancy, unspecified trimester: Secondary | ICD-10-CM

## 2019-09-22 NOTE — Progress Notes (Signed)
   HIGH-RISK PREGNANCY VISIT Patient name: Tara Stark MRN [id number]  Date of birth: July 20, 1981 Chief Complaint:   Routine Prenatal Visit  History of Present Illness:   Tara Stark is a 39 y.o. G86P2002 female at [redacted]w[redacted]d with an Estimated Date of Delivery: 11/05/19 being seen today for ongoing management of a high-risk pregnancy complicated by chronic hypertension currently on Norvasc 10mg ; also with short cx using Prometrium PV qhs.  Today she reports vag d/c; not really itching; sometimes light brown. Contractions: Not present. Vag. Bleeding: None.  Movement: Present. denies leaking of fluid.  Review of Systems:   Pertinent items are noted in HPI Denies abnormal vaginal discharge w/ itching/odor/irritation, headaches, visual changes, shortness of breath, chest pain, abdominal pain, severe nausea/vomiting, or problems with urination or bowel movements unless otherwise stated above. Pertinent History Reviewed:  Reviewed past medical,surgical, social, obstetrical and family history.  Reviewed problem list, medications and allergies. Physical Assessment:   Vitals:   09/22/19 1150  BP: 123/74  Pulse: 86  Weight: 189 lb (85.7 kg)  Body mass index is 36.91 kg/m.           Physical Examination:   General appearance: alert, well appearing, and in no distress  Mental status: alert, oriented to person, place, and time  Skin: warm & dry   Extremities: Edema: None    Cardiovascular: normal heart rate noted  Respiratory: normal respiratory effort, no distress  Abdomen: gravid, soft, non-tender  Pelvic: Cervical exam deferred  SE: showed mod amt white d/c        Fetal Status: Fetal Heart Rate (bpm): 142 u/s   Movement: Present    Fetal Surveillance Testing today:  [place] [id number] wks,cephalic,cord does not appear to be presenting,baby has changed positions from prior ultrasound,posterior fundal placenta gr 0,BPP 8/8,AFI 12.6 cm,fhr 142 bpm,RI .65,.61,.60=64%,CX length 3 cm   No results  found for this or any previous visit (from the past 24 hour(s)).  Assessment & Plan:  1) High-risk pregnancy G3P2002 at [redacted]w[redacted]d with an Estimated Date of Delivery: 11/05/19   2) cHTN, stable on Norvasc 10mg   3) Hx shortened cx, stable at 3cm today  4) Vaginal discharge, NuSwab sent  5) Desires sterilization, didn't sign papers last week- will do today  Meds: No orders of the defined types were placed in this encounter.   Labs/procedures today: BPP; NuSwab  Treatment Plan:  Twice weekly NST/weekly BPP with IOL at 38 weeks with good BP control  Reviewed: Preterm labor symptoms and general obstetric precautions including but not limited to vaginal bleeding, contractions, leaking of fluid and fetal movement were reviewed in detail with the patient.  All questions were answered. Has home bp cuff. Check bp weekly, let us know if >140/90.   Follow-up: Return for continue with NST 2x/wk alternating with BPP weekly (already scheduled), Sign BTL consent today.  Orders Placed This Encounter  Procedures  . NuSwab Vaginitis Plus (VG+)   Myrtis Ser [facility] 09/22/2019 12:22 PM

## 2019-09-22 NOTE — Progress Notes (Signed)
[place] [id number] wks,cephalic,cord does not appear to be presenting,baby has changed positions from prior ultrasound,posterior fundal placenta gr 0,BPP 8/8,AFI 12.6 cm,fhr 142 bpm,RI .65,.61,.60=64%,CX length 3 cm

## 2019-09-25 ENCOUNTER — Telehealth: Payer: Self-pay | Admitting: *Deleted

## 2019-09-25 NOTE — Telephone Encounter (Signed)
Patient states Saturday BP was 160's/112, Sunday 140/80.  She has has some headaches,blurred vision and lightheadedness over the weekend.  Currently today, her BP is  140/80. She has had some headaches, blurred vision and floaters but not currently.  Has not taken any Tylenol only Aspirin and BP medication today.  Discussed with [facility] and advised to take Tylenol and if symptoms worsen or do not improve or if BP gets worse, to go to [facility]. Pt verbalized understanding with no further questions.

## 2019-09-26 ENCOUNTER — Ambulatory Visit (INDEPENDENT_AMBULATORY_CARE_PROVIDER_SITE_OTHER): Payer: Medicaid Other | Admitting: *Deleted

## 2019-09-26 ENCOUNTER — Other Ambulatory Visit: Payer: Self-pay

## 2019-09-26 ENCOUNTER — Other Ambulatory Visit: Payer: Self-pay | Admitting: Advanced Practice Midwife

## 2019-09-26 VITALS — BP 132/87 | HR 93 | Wt 189.4 lb

## 2019-09-26 DIAGNOSIS — Z3A34 34 weeks gestation of pregnancy: Secondary | ICD-10-CM

## 2019-09-26 DIAGNOSIS — I10 Essential (primary) hypertension: Secondary | ICD-10-CM

## 2019-09-26 DIAGNOSIS — Z331 Pregnant state, incidental: Secondary | ICD-10-CM

## 2019-09-26 DIAGNOSIS — O0990 Supervision of high risk pregnancy, unspecified, unspecified trimester: Secondary | ICD-10-CM

## 2019-09-26 DIAGNOSIS — O10013 Pre-existing essential hypertension complicating pregnancy, third trimester: Secondary | ICD-10-CM

## 2019-09-26 DIAGNOSIS — Z1389 Encounter for screening for other disorder: Secondary | ICD-10-CM

## 2019-09-26 DIAGNOSIS — O09892 Supervision of other high risk pregnancies, second trimester: Secondary | ICD-10-CM

## 2019-09-26 LAB — POCT URINALYSIS DIPSTICK OB
Blood, UA: NEGATIVE
Glucose, UA: NEGATIVE
Ketones, UA: NEGATIVE
Nitrite, UA: NEGATIVE
POC,PROTEIN,UA: NEGATIVE

## 2019-09-26 LAB — NUSWAB VAGINITIS PLUS (VG+)
Atopobium vaginae: HIGH Score — AB
Candida albicans, NAA: POSITIVE — AB
Candida glabrata, NAA: NEGATIVE
Chlamydia trachomatis, NAA: NEGATIVE
Megasphaera 1: HIGH Score — AB
Neisseria gonorrhoeae, NAA: NEGATIVE
Trich vag by NAA: NEGATIVE

## 2019-09-26 MED ORDER — METRONIDAZOLE 500 MG PO TABS: 500.0000 mg | ORAL_TABLET | Freq: Two times a day (BID) | ORAL | 0 refills | Status: DC

## 2019-09-26 NOTE — Progress Notes (Addendum)
   NURSE VISIT- NST  SUBJECTIVE:  Tara Stark is a 39 y.o. G9P2002 female at [redacted]w[redacted]d, here for a NST for pregnancy complicated by [facility].  She reports active fetal movement, contractions: none, vaginal bleeding: none, membranes: intact.   OBJECTIVE:  BP 132/87   Pulse 93   Wt 189 lb 6.4 oz (85.9 kg)   LMP 01/14/2019   BMI 36.99 kg/m   Appears well, no apparent distress  Results for orders placed or performed in visit on 09/26/19 (from the past 24 hour(s))  POC Urinalysis Dipstick OB   Collection Time: 09/26/19  2:01 PM  Result Value Ref Range   Color, UA     Clarity, UA     Glucose, UA Negative Negative   Bilirubin, UA     Ketones, UA neg    Spec Grav, UA     Blood, UA neg    pH, UA     POC,PROTEIN,UA Negative Negative, Trace, Small (1+), Moderate (2+), Large (3+), 4+   Urobilinogen, UA     Nitrite, UA neg    Leukocytes, UA Moderate (2+) (A) Negative   Appearance     Odor      NST: FHR baseline 130 bpm, Variability: moderate, Accelerations:present, Decelerations:  Absent= Cat 1/Reactive Toco: none   ASSESSMENT: G3P2002 at [redacted]w[redacted]d with CHTN NST reactive  PLAN: EFM strip reviewed by Dr. Elonda Husky   Recommendations: keep next appointment as scheduled    [name]  09/26/2019 3:48 PM   Attestation of Attending Supervision of Advanced Practitioner (CNM/NP/PA): Evaluation and management procedures were performed by the Advanced Practitioner under my supervision and collaboration. I have reviewed the Advanced Practitioner's note and chart, and I agree with the management and plan.  Jacelyn Grip MD Attending Physician for the Center for [facility] 01/09/2020 6:40 AM

## 2019-09-28 ENCOUNTER — Other Ambulatory Visit: Payer: Self-pay | Admitting: Advanced Practice Midwife

## 2019-09-28 DIAGNOSIS — O10919 Unspecified pre-existing hypertension complicating pregnancy, unspecified trimester: Secondary | ICD-10-CM

## 2019-09-29 ENCOUNTER — Ambulatory Visit (INDEPENDENT_AMBULATORY_CARE_PROVIDER_SITE_OTHER): Payer: Medicaid Other

## 2019-09-29 ENCOUNTER — Other Ambulatory Visit: Payer: Self-pay

## 2019-09-29 ENCOUNTER — Encounter: Payer: Self-pay | Admitting: Women's Health

## 2019-09-29 ENCOUNTER — Ambulatory Visit (INDEPENDENT_AMBULATORY_CARE_PROVIDER_SITE_OTHER): Payer: Medicaid Other | Admitting: Women's Health

## 2019-09-29 VITALS — BP 135/87 | HR 85 | Wt 188.0 lb

## 2019-09-29 DIAGNOSIS — R42 Dizziness and giddiness: Secondary | ICD-10-CM | POA: Diagnosis not present

## 2019-09-29 DIAGNOSIS — Z3A34 34 weeks gestation of pregnancy: Secondary | ICD-10-CM

## 2019-09-29 DIAGNOSIS — I10 Essential (primary) hypertension: Secondary | ICD-10-CM

## 2019-09-29 DIAGNOSIS — Z3483 Encounter for supervision of other normal pregnancy, third trimester: Secondary | ICD-10-CM

## 2019-09-29 DIAGNOSIS — O10919 Unspecified pre-existing hypertension complicating pregnancy, unspecified trimester: Secondary | ICD-10-CM

## 2019-09-29 DIAGNOSIS — O0993 Supervision of high risk pregnancy, unspecified, third trimester: Secondary | ICD-10-CM

## 2019-09-29 DIAGNOSIS — O0990 Supervision of high risk pregnancy, unspecified, unspecified trimester: Secondary | ICD-10-CM

## 2019-09-29 DIAGNOSIS — O09892 Supervision of other high risk pregnancies, second trimester: Secondary | ICD-10-CM

## 2019-09-29 NOTE — Progress Notes (Signed)
[place] [id number] wks,cephalic,BPP [id number] BPM,AFI 18.2 cm,posterior placenta gr 1,RI .61,.59,.49=37%

## 2019-09-29 NOTE — Patient Instructions (Signed)
Tara Stark, I greatly value your feedback.  If you receive a survey following your visit with [place] today, we appreciate you taking the time to fill it out.  Thanks, [name], CNM, [facility]  [place]!!! It is now [place] at [facility] ([place], [place] [address]) Entrance located off of [address] parking   Go to [email or web address] to register for FREE online childbirth classes    Call the office [phone]) or go to [facility] if:  You begin to have strong, frequent contractions  Your water breaks.  Sometimes it is a big gush of fluid, sometimes it is just a trickle that keeps getting your panties wet or running down your legs  You have vaginal bleeding.  It is normal to have a small amount of spotting if your cervix was checked.   You don't feel your baby moving like normal.  If you don't, get you something to eat and drink and lay down and focus on feeling your baby move.  You should feel at least 10 movements in 2 hours.  If you don't, you should call the office or go to [facility].   Call the office [phone]) or go to [facility] hospital for these signs of pre-eclampsia:  Severe headache that does not go away with Tylenol  Visual changes- seeing spots, double, blurred vision  Pain under your right breast or upper abdomen that does not go away with Tums or heartburn medicine  Nausea and/or vomiting  Severe swelling in your hands, feet, and face      Home Blood Pressure Monitoring for Patients call us or go to [facility] for blood pressure >=160 on top or >=110 on the bottom  Your provider has recommended that you check your blood pressure (BP) at least once a week at home. If you do not have a blood pressure cuff at home, one will be provided for you. Contact your provider if you have not received your monitor within 1 week.   Helpful Tips for Accurate Home Blood Pressure Checks  . Don't smoke,  exercise, or drink caffeine 30 minutes before checking your BP . Use the restroom before checking your BP (a full bladder can raise your pressure) . Relax in a comfortable upright chair . Feet on the ground . Left arm resting comfortably on a flat surface at the level of your heart . Legs uncrossed . Back supported . Sit quietly and don't talk . Place the cuff on your bare arm . Adjust snuggly, so that only two fingertips can fit between your skin and the top of the cuff . Check 2 readings separated by at least one minute . Keep a log of your BP readings . For a visual, please reference this diagram: http://ccnc.care/bpdiagram  Provider Name: [facility]     Phone: [phone]  Zone 1: ALL CLEAR  Continue to monitor your symptoms:  . BP reading is less than 140 (top number) or less than 90 (bottom number)  . No right upper stomach pain . No headaches or seeing spots . No feeling nauseated or throwing up . No swelling in face and hands  Zone 2: CAUTION Call your doctor's office for any of the following:  . BP reading is greater than 140 (top number) or greater than 90 (bottom number)  . Stomach pain under your ribs in the middle or right side . Headaches or seeing spots . Feeling  nauseated or throwing up . Swelling in face and hands  Zone 3: EMERGENCY  Seek immediate medical care if you have any of the following:  . BP reading is greater than160 (top number) or greater than 110 (bottom number) . Severe headaches not improving with Tylenol . Serious difficulty catching your breath . Any worsening symptoms from Zone 2

## 2019-09-29 NOTE — Progress Notes (Signed)
   HIGH-RISK PREGNANCY VISIT Patient name: Tara Stark MRN [id number]  Date of birth: 04/06/1981 Chief Complaint:   Routine Prenatal Visit  History of Present Illness:   Tara Stark is a 39 y.o. G85P2002 female at [redacted]w[redacted]d with an Estimated Date of Delivery: 11/05/19 being seen today for ongoing management of a high-risk pregnancy complicated by chronic hypertension currently on norvasc 10mg , h/o FGR, uterine fibroid.  Today she reports lightheadedness/dizziness, no cp/sob. Contractions: Not present. Vag. Bleeding: None.  Movement: Present. denies leaking of fluid.  Review of Systems:   Pertinent items are noted in HPI Denies abnormal vaginal discharge w/ itching/odor/irritation, headaches, visual changes, shortness of breath, chest pain, abdominal pain, severe nausea/vomiting, or problems with urination or bowel movements unless otherwise stated above. Pertinent History Reviewed:  Reviewed past medical,surgical, social, obstetrical and family history.  Reviewed problem list, medications and allergies. Physical Assessment:   Vitals:   09/29/19 1217  BP: 135/87  Pulse: 85  Weight: 188 lb (85.3 kg)  Body mass index is 36.72 kg/m.           Physical Examination:   General appearance: alert, well appearing, and in no distress  Mental status: alert, oriented to person, place, and time  Skin: warm & dry   Extremities: Edema: None    Cardiovascular: normal heart rate noted  Respiratory: normal respiratory effort, no distress  Abdomen: gravid, soft, non-tender  Pelvic: Cervical exam deferred         Fetal Status: Fetal Heart Rate (bpm): 127 u/s   Movement: Present    Fetal Surveillance Testing today: [place] [id number] wks,cephalic,BPP [id number] BPM,AFI 18.2 cm,posterior placenta gr 1,RI .61,.59,.49=37%  Chaperone: n/a    Fingerstick Hgb: 12.1  No results found for this or any previous visit (from the past 24 hour(s)).  Assessment & Plan:  1) High-risk pregnancy G3P2002 at [redacted]w[redacted]d with an  Estimated Date of Delivery: 11/05/19   2) CHTN, stable on norvasc 10mg , wants 38wk (not 39wk)- ok per MFM recommendations sheet  3) Dizziness/lightheadedness, fingerstick hgb 12.1, has h/o Vit D deficiency, will check VitD and TSH. Stay well hydrated, eat frequent snacks  4) H/O short cervix w/ cerclage 1st pregnancy> no problems this pregnancy  5) H/O FGR> normal growth this pregnancy  Meds: No orders of the defined types were placed in this encounter.  Labs/procedures today: as below  Treatment Plan: Growth q4wks    2x/wk testing nst/sono      Deliver 38-39wks (37wks or prn if poor control)____   Reviewed: Preterm labor symptoms and general obstetric precautions including but not limited to vaginal bleeding, contractions, leaking of fluid and fetal movement were reviewed in detail with the patient.  All questions were answered. Has home bp cuff.  Check bp weekly, let us know if >140/90.   Follow-up: Return for As scheduled Tues for nst/nurse visit, Fri .  Orders Placed This Encounter  Procedures  . TSH  . VITAMIN D 25 Hydroxy (Vit-D Deficiency, Fractures)   Roma Schanz CNM, WHNP-BC 09/29/2019 2:10 PM

## 2019-09-30 LAB — TSH: TSH: 1.05 u[IU]/mL (ref 0.450–4.500)

## 2019-09-30 LAB — VITAMIN D 25 HYDROXY (VIT D DEFICIENCY, FRACTURES): Vit D, 25-Hydroxy: 84.7 ng/mL (ref 30.0–100.0)

## 2019-10-03 ENCOUNTER — Ambulatory Visit (INDEPENDENT_AMBULATORY_CARE_PROVIDER_SITE_OTHER): Payer: Medicaid Other | Admitting: *Deleted

## 2019-10-03 ENCOUNTER — Other Ambulatory Visit: Payer: Self-pay

## 2019-10-03 DIAGNOSIS — O10013 Pre-existing essential hypertension complicating pregnancy, third trimester: Secondary | ICD-10-CM

## 2019-10-03 DIAGNOSIS — O0990 Supervision of high risk pregnancy, unspecified, unspecified trimester: Secondary | ICD-10-CM

## 2019-10-03 DIAGNOSIS — Z3A35 35 weeks gestation of pregnancy: Secondary | ICD-10-CM | POA: Diagnosis not present

## 2019-10-03 DIAGNOSIS — I10 Essential (primary) hypertension: Secondary | ICD-10-CM

## 2019-10-03 NOTE — Progress Notes (Signed)
   NURSE VISIT- NST  SUBJECTIVE:  Tara Stark is a 39 y.o. G58P2002 female at [redacted]w[redacted]d, here for a NST for pregnancy complicated by [facility].  She reports active fetal movement, contractions: none, vaginal bleeding: none, membranes: intact.   OBJECTIVE:  BP 135/85   LMP 01/14/2019   Appears well, no apparent distress  No results found for this or any previous visit (from the past 24 hour(s)).  NST: FHR baseline 130 bpm, Variability: moderate, Accelerations:present, Decelerations:  Absent= Cat 1/Reactive Toco: none   ASSESSMENT: G3P2002 at [redacted]w[redacted]d with CHTN NST reactive  PLAN: EFM strip reviewed by [name], CNM, [facility]   Recommendations: keep next appointment as scheduled    [name]  10/03/2019 3:58 PM

## 2019-10-05 ENCOUNTER — Other Ambulatory Visit: Payer: Self-pay | Admitting: Women's Health

## 2019-10-05 DIAGNOSIS — O10919 Unspecified pre-existing hypertension complicating pregnancy, unspecified trimester: Secondary | ICD-10-CM

## 2019-10-06 ENCOUNTER — Ambulatory Visit (INDEPENDENT_AMBULATORY_CARE_PROVIDER_SITE_OTHER): Payer: Medicaid Other

## 2019-10-06 ENCOUNTER — Ambulatory Visit (INDEPENDENT_AMBULATORY_CARE_PROVIDER_SITE_OTHER): Payer: Medicaid Other | Admitting: Advanced Practice Midwife

## 2019-10-06 ENCOUNTER — Other Ambulatory Visit: Payer: Self-pay

## 2019-10-06 VITALS — BP 135/85 | HR 88 | Wt 188.0 lb

## 2019-10-06 DIAGNOSIS — Z3A35 35 weeks gestation of pregnancy: Secondary | ICD-10-CM

## 2019-10-06 DIAGNOSIS — O10919 Unspecified pre-existing hypertension complicating pregnancy, unspecified trimester: Secondary | ICD-10-CM

## 2019-10-06 DIAGNOSIS — O10913 Unspecified pre-existing hypertension complicating pregnancy, third trimester: Secondary | ICD-10-CM | POA: Diagnosis not present

## 2019-10-06 DIAGNOSIS — O09892 Supervision of other high risk pregnancies, second trimester: Secondary | ICD-10-CM

## 2019-10-06 DIAGNOSIS — I10 Essential (primary) hypertension: Secondary | ICD-10-CM

## 2019-10-06 DIAGNOSIS — Z3483 Encounter for supervision of other normal pregnancy, third trimester: Secondary | ICD-10-CM

## 2019-10-06 DIAGNOSIS — O10013 Pre-existing essential hypertension complicating pregnancy, third trimester: Secondary | ICD-10-CM

## 2019-10-06 DIAGNOSIS — O0990 Supervision of high risk pregnancy, unspecified, unspecified trimester: Secondary | ICD-10-CM

## 2019-10-06 NOTE — Patient Instructions (Signed)

## 2019-10-06 NOTE — Progress Notes (Signed)
[place] [id number] wks,cephalic,FHR Q000111Q bpm,BPP [id number] placenta gr 1,afi 14 cm,RI .64,.64,.61=76%

## 2019-10-06 NOTE — Progress Notes (Signed)
   PRENATAL VISIT NOTE  Subjective:  Tara Stark is a 39 y.o. G3P2002 at [redacted]w[redacted]d being seen today for ongoing prenatal care.  She is currently monitored for the following issues for this high-risk pregnancy and has History of cervical cerclage; History of prior pregnancy with IUGR newborn; Uterine fibroid; Vitamin D deficiency disease; Supervision of high risk pregnancy, antepartum; Short interval between pregnancies affecting pregnancy in second trimester, antepartum; and Chronic hypertension on their problem list.  Patient reports no complaints.  Contractions: Not present. Vag. Bleeding: None.  Movement: Present. Denies leaking of fluid.   The following portions of the patient's history were reviewed and updated as appropriate: allergies, current medications, past family history, past medical history, past social history, past surgical history and problem list. Problem list updated.  Objective:   Vitals:   10/06/19 1045  BP: 135/85  Pulse: 88  Weight: 188 lb (85.3 kg)    Fetal Status: Fetal Heart Rate (bpm): 132 u/s   Movement: Present     General:  Alert, oriented and cooperative. Patient is in no acute distress.  Skin: Skin is warm and dry. No rash noted.   Cardiovascular: Normal heart rate noted  Respiratory: Normal respiratory effort, no problems with respiration noted  Abdomen: Soft, gravid, appropriate for gestational age.  Pain/Pressure: Absent     Pelvic: Cervical exam deferred        Extremities: Normal range of motion.  Edema: None  Mental Status: Normal mood and affect. Normal behavior. Normal judgment and thought content.   Assessment and Plan:  Pregnancy: G3P2002 at [redacted]w[redacted]d  1. Encounter for supervision of other normal pregnancy in third trimester - No changes to plan of care - BPP 8/8 today - Cord Doppler 76% - Desires IOL on 03/28 at 38 weeks, approved by MFM, formally schedule next visit  2. Chronic hypertension - Stable on Norvasc 10 mg  Confirmed  location of MAU for labor, PEC signs/symptoms PRN  Preterm labor symptoms and general obstetric precautions including but not limited to vaginal bleeding, contractions, leaking of fluid and fetal movement were reviewed in detail with the patient. Please refer to After Visit Summary for other counseling recommendations.  No follow-ups on file.  Future Appointments  Date Time Provider [place]  10/10/2019  2:30 PM [facility] NURSE [facility]  10/13/2019 10:30 AM [facility] [place] [facility] None  10/13/2019 11:30 AM Roma Schanz, CNM [facility]  10/17/2019  9:30 AM [facility] NURSE [facility]  10/20/2019 10:00 AM [facility] [place] [facility] None  10/20/2019 10:50 AM Florian Buff, MD [facility]  10/23/2019  2:30 PM [facility] NURSE [facility]  10/26/2019  1:30 PM [place] - [facility] [place] [facility] None  10/26/2019  2:30 PM Roma Schanz, CNM [facility]  10/31/2019  2:30 PM [facility] NURSE [facility]  11/03/2019 10:00 AM [facility] [place] [facility] None  11/03/2019 10:50 AM Jonnie Kind, MD [facility]  11/07/2019  2:30 PM [facility] NURSE [facility]  11/10/2019 11:30 AM [facility] [place] [facility] None  11/10/2019 12:30 PM Eure, [name], MD [facility]    Darlina Rumpf, CNM

## 2019-10-10 ENCOUNTER — Other Ambulatory Visit: Payer: Self-pay

## 2019-10-10 ENCOUNTER — Ambulatory Visit (INDEPENDENT_AMBULATORY_CARE_PROVIDER_SITE_OTHER): Payer: Medicaid Other | Admitting: *Deleted

## 2019-10-10 VITALS — BP 153/96 | HR 97 | Wt 190.0 lb

## 2019-10-10 DIAGNOSIS — O09892 Supervision of other high risk pregnancies, second trimester: Secondary | ICD-10-CM

## 2019-10-10 DIAGNOSIS — Z3A36 36 weeks gestation of pregnancy: Secondary | ICD-10-CM

## 2019-10-10 DIAGNOSIS — O10013 Pre-existing essential hypertension complicating pregnancy, third trimester: Secondary | ICD-10-CM | POA: Diagnosis not present

## 2019-10-10 DIAGNOSIS — Z1389 Encounter for screening for other disorder: Secondary | ICD-10-CM

## 2019-10-10 DIAGNOSIS — Z331 Pregnant state, incidental: Secondary | ICD-10-CM

## 2019-10-10 DIAGNOSIS — O0990 Supervision of high risk pregnancy, unspecified, unspecified trimester: Secondary | ICD-10-CM

## 2019-10-10 DIAGNOSIS — I10 Essential (primary) hypertension: Secondary | ICD-10-CM

## 2019-10-10 DIAGNOSIS — O288 Other abnormal findings on antenatal screening of mother: Secondary | ICD-10-CM

## 2019-10-10 LAB — POCT URINALYSIS DIPSTICK OB
Blood, UA: NEGATIVE
Glucose, UA: NEGATIVE
Ketones, UA: NEGATIVE
Leukocytes, UA: NEGATIVE
Nitrite, UA: NEGATIVE
POC,PROTEIN,UA: NEGATIVE

## 2019-10-10 NOTE — Progress Notes (Signed)
   NURSE VISIT- NST  SUBJECTIVE:  Tara Stark is a 39 y.o. G66P2002 female at [redacted]w[redacted]d, here for a NST for pregnancy complicated by [facility].  She reports active fetal movement, contractions: none, vaginal bleeding: none, membranes: intact.   OBJECTIVE:  BP (!) 153/96   Pulse 97   Wt 190 lb (86.2 kg)   LMP 01/14/2019   BMI 37.11 kg/m   Appears well, no apparent distress  Results for orders placed or performed in visit on 10/10/19 (from the past 24 hour(s))  POC Urinalysis Dipstick OB   Collection Time: 10/10/19  3:01 PM  Result Value Ref Range   Color, UA     Clarity, UA     Glucose, UA Negative Negative   Bilirubin, UA     Ketones, UA neg    Spec Grav, UA     Blood, UA neg    pH, UA     POC,PROTEIN,UA Negative Negative, Trace, Small (1+), Moderate (2+), Large (3+), 4+   Urobilinogen, UA     Nitrite, UA neg    Leukocytes, UA Negative Negative   Appearance     Odor      NST: FHR baseline 130 bpm, Variability: moderate, Accelerations:present, Decelerations:  Absent= Cat 1/Reactive Toco: UI   ASSESSMENT: [id number] at [redacted]w[redacted]d with CHTN NST reactive  PLAN: EFM strip reviewed by Dr. Elonda Husky   Recommendations: keep next appointment as scheduled    Tara Stark  10/10/2019 4:01 PM

## 2019-10-12 ENCOUNTER — Other Ambulatory Visit: Payer: Self-pay | Admitting: Obstetrics and Gynecology

## 2019-10-12 DIAGNOSIS — Z8759 Personal history of other complications of pregnancy, childbirth and the puerperium: Secondary | ICD-10-CM

## 2019-10-12 DIAGNOSIS — O10919 Unspecified pre-existing hypertension complicating pregnancy, unspecified trimester: Secondary | ICD-10-CM

## 2019-10-13 ENCOUNTER — Other Ambulatory Visit (HOSPITAL_COMMUNITY)
Admission: RE | Admit: 2019-10-13 | Discharge: 2019-10-13 | Disposition: A | Discharge status: Home / Self Care | Payer: Medicaid Other | Source: Ambulatory Visit | Attending: Obstetrics & Gynecology | Admitting: Obstetrics & Gynecology

## 2019-10-13 ENCOUNTER — Encounter: Payer: Self-pay | Admitting: Women's Health

## 2019-10-13 ENCOUNTER — Encounter (HOSPITAL_COMMUNITY): Payer: Self-pay | Admitting: *Deleted

## 2019-10-13 ENCOUNTER — Telehealth (HOSPITAL_COMMUNITY): Payer: Self-pay | Admitting: *Deleted

## 2019-10-13 ENCOUNTER — Other Ambulatory Visit: Payer: Self-pay

## 2019-10-13 ENCOUNTER — Ambulatory Visit (INDEPENDENT_AMBULATORY_CARE_PROVIDER_SITE_OTHER): Payer: Medicaid Other

## 2019-10-13 ENCOUNTER — Ambulatory Visit (INDEPENDENT_AMBULATORY_CARE_PROVIDER_SITE_OTHER): Payer: Medicaid Other | Admitting: Women's Health

## 2019-10-13 VITALS — BP 139/91 | HR 87 | Wt 190.0 lb

## 2019-10-13 DIAGNOSIS — Z3A36 36 weeks gestation of pregnancy: Secondary | ICD-10-CM | POA: Diagnosis not present

## 2019-10-13 DIAGNOSIS — O10913 Unspecified pre-existing hypertension complicating pregnancy, third trimester: Secondary | ICD-10-CM

## 2019-10-13 DIAGNOSIS — O09892 Supervision of other high risk pregnancies, second trimester: Secondary | ICD-10-CM

## 2019-10-13 DIAGNOSIS — O0990 Supervision of high risk pregnancy, unspecified, unspecified trimester: Secondary | ICD-10-CM

## 2019-10-13 DIAGNOSIS — Z331 Pregnant state, incidental: Secondary | ICD-10-CM

## 2019-10-13 DIAGNOSIS — O0993 Supervision of high risk pregnancy, unspecified, third trimester: Secondary | ICD-10-CM | POA: Insufficient documentation

## 2019-10-13 DIAGNOSIS — O10919 Unspecified pre-existing hypertension complicating pregnancy, unspecified trimester: Secondary | ICD-10-CM

## 2019-10-13 DIAGNOSIS — O365931 Maternal care for other known or suspected poor fetal growth, third trimester, fetus 1: Secondary | ICD-10-CM | POA: Diagnosis not present

## 2019-10-13 DIAGNOSIS — O10013 Pre-existing essential hypertension complicating pregnancy, third trimester: Secondary | ICD-10-CM | POA: Diagnosis not present

## 2019-10-13 DIAGNOSIS — Z1389 Encounter for screening for other disorder: Secondary | ICD-10-CM

## 2019-10-13 DIAGNOSIS — I10 Essential (primary) hypertension: Secondary | ICD-10-CM

## 2019-10-13 DIAGNOSIS — Z8759 Personal history of other complications of pregnancy, childbirth and the puerperium: Secondary | ICD-10-CM

## 2019-10-13 LAB — POCT URINALYSIS DIPSTICK OB
Blood, UA: NEGATIVE
Glucose, UA: NEGATIVE
Ketones, UA: NEGATIVE
Leukocytes, UA: NEGATIVE
Nitrite, UA: NEGATIVE
POC,PROTEIN,UA: NEGATIVE

## 2019-10-13 NOTE — Progress Notes (Addendum)
[place] [id number] wks,cephalic,posterior placenta gr 2,afi 12.7 cm,fhr 132 bpm,BPP 8/8,RI .61,.52=78%,EFW 3501 g 91%,AC 98%,HC 97%

## 2019-10-13 NOTE — Patient Instructions (Addendum)
Tara Stark, I greatly value your feedback.  If you receive a survey following your visit with [place] today, we appreciate you taking the time to fill it out.  Thanks, Tara Stark, CNM, [facility]  [place]!!! It is now [place] [facility] ([place], Tara Stark Tara Stark) Entrance located off of [address] parking   Go to [email or web address] to register for FREE online childbirth classes    Call the office [phone]) or go to Tara Stark if:  You begin to have strong, frequent contractions  Your water breaks.  Sometimes it is a big gush of fluid, sometimes it is just a trickle that keeps getting your panties wet or running down your legs  You have vaginal bleeding.  It is normal to have a small amount of spotting if your cervix was checked.   You don't feel your baby moving like normal.  If you don't, get you something to eat and drink and lay down and focus on feeling your baby move.  You should feel at least 10 movements in 2 hours.  If you don't, you should call the office or go to Tara Stark.   Call the office [phone]) or go to Tara Stark [facility] for these signs of pre-eclampsia:  Severe headache that does not go away with Tylenol  Visual changes- seeing spots, double, blurred vision  Pain under your right breast or upper abdomen that does not go away with Tums or heartburn medicine  Nausea and/or vomiting  Severe swelling in your hands, feet, and face   Check your blood pressure 4 times daily and keep a log, bring this with you to all appointments.  If blood pressure is >=160 on top or >=110 on bottom, check again in 5 minutes, if still this high, call us or go to [facility] to be evaluated      Home Blood Pressure Monitoring for Patients   Your provider has recommended that you check your blood pressure (BP) at least once a week at home. If you do not have a blood pressure cuff at home, one  will be provided for you. Contact your provider if you have not received your monitor within 1 week.   Helpful Tips for Accurate Home Blood Pressure Checks  . Don't smoke, exercise, or drink caffeine 30 minutes before checking your BP . Use the restroom before checking your BP (a full bladder can raise your pressure) . Relax in a comfortable upright chair . Feet on the ground . Left arm resting comfortably on a flat surface at the level of your heart . Legs uncrossed . Back supported . Sit quietly and don't talk . Place the cuff on your bare arm . Adjust snuggly, so that only two fingertips can fit between your skin and the top of the cuff . Check 2 readings separated by at least one minute . Keep a log of your BP readings . For a visual, please reference this diagram: http://ccnc.care/bpdiagram  Provider Name: [facility]     Phone: [phone]  Zone 1: ALL CLEAR  Continue to monitor your symptoms:  . BP reading is less than 140 (top number) or less than 90 (bottom number)  . No right upper stomach pain . No headaches or seeing spots . No feeling nauseated or throwing up . No swelling in face and hands  Zone 2: CAUTION Call your doctor's office for any of the following:  .  BP reading is greater than 140 (top number) or greater than 90 (bottom number)  . Stomach pain under your ribs in the middle or right side . Headaches or seeing spots . Feeling nauseated or throwing up . Swelling in face and hands  Zone 3: EMERGENCY  Seek immediate medical care if you have any of the following:  . BP reading is greater than160 (top number) or greater than 110 (bottom number) . Severe headaches not improving with Tylenol . Serious difficulty catching your breath . Any worsening symptoms from Zone 2    Braxton Hicks Contractions Contractions of the uterus can occur throughout pregnancy, but they are not always a sign that you are in labor. You may have practice contractions called  Braxton Hicks contractions. These false labor contractions are sometimes confused with true labor. What are [place] contractions? Braxton Hicks contractions are tightening movements that occur in the muscles of the uterus before labor. Unlike true labor contractions, these contractions do not result in opening (dilation) and thinning of the cervix. Toward the end of pregnancy (32-34 weeks), Braxton Hicks contractions can happen more often and may become stronger. These contractions are sometimes difficult to tell apart from true labor because they can be very uncomfortable. You should not feel embarrassed if you go to the [facility] with false labor. Sometimes, the only way to tell if you are in true labor is for your health care provider to look for changes in the cervix. The health care provider will do a physical exam and may monitor your contractions. If you are not in true labor, the exam should show that your cervix is not dilating and your water has not broken. If there are no other health problems associated with your pregnancy, it is completely safe for you to be sent home with false labor. You may continue to have Braxton Hicks contractions until you go into true labor. How to tell the difference between true labor and false labor True labor  Contractions last 30-70 seconds.  Contractions become very regular.  Discomfort is usually felt in the top of the uterus, and it spreads to the lower abdomen and low back.  Contractions do not go away with walking.  Contractions usually become more intense and increase in frequency.  The cervix dilates and gets thinner. False labor  Contractions are usually shorter and not as strong as true labor contractions.  Contractions are usually irregular.  Contractions are often felt in the front of the lower abdomen and in the groin.  Contractions may go away when you walk around or change positions while lying down.  Contractions get weaker  and are shorter-lasting as time goes on.  The cervix usually does not dilate or become thin. Follow these instructions at home:   Take over-the-counter and prescription medicines only as told by your health care provider.  Keep up with your usual exercises and follow other instructions from your health care provider.  Eat and drink lightly if you think you are going into labor.  If Braxton Hicks contractions are making you uncomfortable: ? Change your position from lying down or resting to walking, or change from walking to resting. ? Sit and rest in a tub of warm water. ? Drink enough fluid to keep your urine pale yellow. Dehydration may cause these contractions. ? Do slow and deep breathing several times an hour.  Keep all follow-up prenatal visits as told by your health care provider. This is important. Contact a health care  provider if:  You have a fever.  You have continuous pain in your abdomen. Get help right away if:  Your contractions become stronger, more regular, and closer together.  You have fluid leaking or gushing from your vagina.  You pass blood-tinged mucus (bloody show).  You have bleeding from your vagina.  You have low back pain that you never had before.  You feel your baby's head pushing down and causing pelvic pressure.  Your baby is not moving inside you as much as it used to. Summary  Contractions that occur before labor are called Braxton Hicks contractions, false labor, or practice contractions.  Braxton Hicks contractions are usually shorter, weaker, farther apart, and less regular than true labor contractions. True labor contractions usually become progressively stronger and regular, and they become more frequent.  Manage discomfort from [facility] contractions by changing position, resting in a warm bath, drinking plenty of water, or practicing deep breathing. This information is not intended to replace advice given to you by your health  care provider. Make sure you discuss any questions you have with your health care provider. Document Revised: 06/25/2017 Document Reviewed: 11/26/2016 Elsevier Patient Education  Anon [place].

## 2019-10-13 NOTE — Progress Notes (Signed)
HIGH-RISK PREGNANCY VISIT Patient name: Tara Stark MRN [id number]  Date of birth: 14-Mar-1981 Chief Complaint:   Routine Prenatal Visit  History of Present Illness:   Tara Stark is a 39 y.o. G40P2002 female at [redacted]w[redacted]d with an Estimated Date of Delivery: 11/05/19 being seen today for ongoing management of a high-risk pregnancy complicated by chronic hypertension currently on norvasc 10mg , h/o FGR, large uterine fibroid.  Today she reports occ mild headaches, doesn't have to take anything, denies visual changes, ruq/epigastric pain, n/v. Just took norvasc 15-41mins before coming to appt. Contractions: Irregular. Vag. Bleeding: None.  Movement: Present. denies leaking of fluid.  Review of Systems:   Pertinent items are noted in HPI Denies abnormal vaginal discharge w/ itching/odor/irritation, headaches, visual changes, shortness of breath, chest pain, abdominal pain, severe nausea/vomiting, or problems with urination or bowel movements unless otherwise stated above. Pertinent History Reviewed:  Reviewed past medical,surgical, social, obstetrical and family history.  Reviewed problem list, medications and allergies. Physical Assessment:   Vitals:   10/13/19 1136  BP: (!) 139/91  Pulse: 87  Weight: 190 lb (86.2 kg)  Body mass index is 37.11 kg/m.           Physical Examination:   General appearance: alert, well appearing, and in no distress  Mental status: alert, oriented to person, place, and time  Skin: warm & dry   Extremities: Edema: None    Cardiovascular: normal heart rate noted  Respiratory: normal respiratory effort, no distress  Abdomen: gravid, soft, non-tender  Pelvic: Cervical exam performed  Dilation: 3 Effacement (%): 50 Station: Ballotable  Fetal Status: Fetal Heart Rate (bpm): 132 u/s   Movement: Present Presentation: Vertex  Fetal Surveillance Testing today: [place] [id number] wks,cephalic,posterior placenta gr 2,afi 12.7 cm,fhr 132 bpm,BPP 8/8,RI .61,.52=78%,EFW 3501 g  91%,AC 98%,HC 97%  Chaperone: Peggy Dones    Results for orders placed or performed in visit on 10/13/19 (from the past 24 hour(s))  POC Urinalysis Dipstick OB   Collection Time: 10/13/19 11:37 AM  Result Value Ref Range   Color, UA     Clarity, UA     Glucose, UA Negative Negative   Bilirubin, UA     Ketones, UA neg    Spec Grav, UA     Blood, UA neg    pH, UA     POC,PROTEIN,UA Negative Negative, Trace, Small (1+), Moderate (2+), Large (3+), 4+   Urobilinogen, UA     Nitrite, UA neg    Leukocytes, UA Negative Negative   Appearance     Odor      Assessment & Plan:  1) High-risk pregnancy G3P2002 at [redacted]w[redacted]d with an Estimated Date of Delivery: 11/05/19   2) CHTN, on norvasc 10mg  daily, bp's elevating slightly over last 2 visits, asymptomatic, no proteinuria, just took norvasc 15-42min prior to visit. Will check pre-e labs today. Check bp QID, reviewed severe range levels, pre-e s/s, reasons to seek care.   3) H/O FGR, EFW today actually 91%/3501g  4) 9cm anterior fibroid  Meds: No orders of the defined types were placed in this encounter.   Labs/procedures today: gbs, gc/ct, sve  Treatment Plan:  2x/wk testing bpp/dopp alt w/ nst, IOL @ 38wks per pt request (rather than 39wks),  IOL form faxed via Epic and orders placed   Reviewed: Term labor symptoms and general obstetric precautions including but not limited to vaginal bleeding, contractions, leaking of fluid and fetal movement were reviewed in detail with the patient.  All questions  were answered. Has home bp cuff.    Follow-up: Return for As scheduled tues nst/rn.  Orders Placed This Encounter  Procedures  . Culture, beta strep (group b only)  . CBC  . Comprehensive metabolic panel  . Protein / creatinine ratio, urine  . POC Urinalysis Dipstick OB   Roma Schanz CNM, [facility] 10/13/2019 12:09 PM

## 2019-10-13 NOTE — Progress Notes (Signed)
Induction Assessment Scheduling Form Fax to [facility]:  [id number]  Tara Stark                                                                                   DOB:  06-15-1981                                                            MRN:  [id number]                                                                     Phone #:     [phone] (Mobile)                 Provider:  [facility]  GP:  [id number]                                                            Estimated Date of Delivery: 11/05/19  Dating Criteria: 8wk u/s    Medical Indications for induction:  CHTN on meds Admission Date/Time:  3/28 AM Gestational age on admission:  38.0   Filed Weights   10/13/19 1136  Weight: 190 lb (86.2 kg)   HIV:  Non Reactive (01/07 0831) GBS:  pending  3/50/ballotable, vtx   Method of induction(proposed):  Pit   Scheduling Provider Signature:  Roma Schanz, CNM                                            Today's Date:  10/13/2019

## 2019-10-13 NOTE — Telephone Encounter (Signed)
Preadmission screen  

## 2019-10-14 LAB — CBC
Hematocrit: 37.9 % (ref 34.0–46.6)
Hemoglobin: 12.4 g/dL (ref 11.1–15.9)
MCH: 26.7 pg (ref 26.6–33.0)
MCHC: 32.7 g/dL (ref 31.5–35.7)
MCV: 82 fL (ref 79–97)
Platelets: 243 10*3/uL (ref 150–450)
RBC: 4.64 x10E6/uL (ref 3.77–5.28)
RDW: 14.4 % (ref 11.7–15.4)
WBC: 11.5 10*3/uL — ABNORMAL HIGH (ref 3.4–10.8)

## 2019-10-14 LAB — COMPREHENSIVE METABOLIC PANEL
ALT: 15 IU/L (ref 0–32)
AST: 19 IU/L (ref 0–40)
Albumin/Globulin Ratio: 1.3 (ref 1.2–2.2)
Albumin: 3.6 g/dL — ABNORMAL LOW (ref 3.8–4.8)
Alkaline Phosphatase: 206 IU/L — ABNORMAL HIGH (ref 39–117)
BUN/Creatinine Ratio: 8 — ABNORMAL LOW (ref 9–23)
BUN: 4 mg/dL — ABNORMAL LOW (ref 6–20)
Bilirubin Total: 0.3 mg/dL (ref 0.0–1.2)
CO2: 19 mmol/L — ABNORMAL LOW (ref 20–29)
Calcium: 9.7 mg/dL (ref 8.7–10.2)
Chloride: 105 mmol/L (ref 96–106)
Creatinine, Ser: 0.49 mg/dL — ABNORMAL LOW (ref 0.57–1.00)
GFR calc Af Amer: 143 mL/min/{1.73_m2} (ref >59)
GFR calc non Af Amer: 124 mL/min/{1.73_m2} (ref >59)
Globulin, Total: 2.7 g/dL (ref 1.5–4.5)
Glucose: 70 mg/dL (ref 65–99)
Potassium: 4.2 mmol/L (ref 3.5–5.2)
Sodium: 139 mmol/L (ref 134–144)
Total Protein: 6.3 g/dL (ref 6.0–8.5)

## 2019-10-14 LAB — PROTEIN / CREATININE RATIO, URINE
Creatinine, Urine: 37.5 mg/dL
Protein, Ur: 9.4 mg/dL
Protein/Creat Ratio: 251 mg/g creat — ABNORMAL HIGH (ref 0–200)

## 2019-10-16 ENCOUNTER — Other Ambulatory Visit: Payer: Self-pay | Admitting: Advanced Practice Midwife

## 2019-10-16 ENCOUNTER — Other Ambulatory Visit: Payer: Self-pay | Admitting: Women's Health

## 2019-10-16 DIAGNOSIS — O0990 Supervision of high risk pregnancy, unspecified, unspecified trimester: Secondary | ICD-10-CM

## 2019-10-16 LAB — CERVICOVAGINAL ANCILLARY ONLY
Chlamydia: NEGATIVE
Comment: NEGATIVE
Comment: NORMAL
Neisseria Gonorrhea: NEGATIVE

## 2019-10-16 LAB — CULTURE, BETA STREP (GROUP B ONLY): Strep Gp B Culture: POSITIVE — AB

## 2019-10-17 ENCOUNTER — Inpatient Hospital Stay (HOSPITAL_COMMUNITY)
Admission: AD | Admit: 2019-10-17 | Discharge: 2019-10-17 | Disposition: A | Discharge status: Home / Self Care | Payer: Medicaid Other | Attending: Obstetrics and Gynecology | Admitting: Obstetrics and Gynecology

## 2019-10-17 ENCOUNTER — Encounter (HOSPITAL_COMMUNITY): Payer: Self-pay | Admitting: Obstetrics and Gynecology

## 2019-10-17 ENCOUNTER — Other Ambulatory Visit: Payer: Self-pay

## 2019-10-17 ENCOUNTER — Ambulatory Visit: Payer: Medicaid Other | Admitting: *Deleted

## 2019-10-17 VITALS — BP 142/92 | HR 108 | Wt 193.2 lb

## 2019-10-17 DIAGNOSIS — R030 Elevated blood-pressure reading, without diagnosis of hypertension: Secondary | ICD-10-CM | POA: Diagnosis present

## 2019-10-17 DIAGNOSIS — O26893 Other specified pregnancy related conditions, third trimester: Secondary | ICD-10-CM | POA: Diagnosis not present

## 2019-10-17 DIAGNOSIS — O10013 Pre-existing essential hypertension complicating pregnancy, third trimester: Secondary | ICD-10-CM | POA: Insufficient documentation

## 2019-10-17 DIAGNOSIS — O10919 Unspecified pre-existing hypertension complicating pregnancy, unspecified trimester: Secondary | ICD-10-CM

## 2019-10-17 DIAGNOSIS — R519 Headache, unspecified: Secondary | ICD-10-CM | POA: Diagnosis not present

## 2019-10-17 DIAGNOSIS — Z1389 Encounter for screening for other disorder: Secondary | ICD-10-CM

## 2019-10-17 DIAGNOSIS — Z3A37 37 weeks gestation of pregnancy: Secondary | ICD-10-CM | POA: Diagnosis not present

## 2019-10-17 DIAGNOSIS — Z3689 Encounter for other specified antenatal screening: Secondary | ICD-10-CM

## 2019-10-17 DIAGNOSIS — O0990 Supervision of high risk pregnancy, unspecified, unspecified trimester: Secondary | ICD-10-CM

## 2019-10-17 DIAGNOSIS — Z87891 Personal history of nicotine dependence: Secondary | ICD-10-CM | POA: Diagnosis not present

## 2019-10-17 DIAGNOSIS — Z331 Pregnant state, incidental: Secondary | ICD-10-CM

## 2019-10-17 DIAGNOSIS — O09892 Supervision of other high risk pregnancies, second trimester: Secondary | ICD-10-CM

## 2019-10-17 DIAGNOSIS — I10 Essential (primary) hypertension: Secondary | ICD-10-CM

## 2019-10-17 LAB — PROTEIN / CREATININE RATIO, URINE
Creatinine, Urine: 103.5 mg/dL
Protein Creatinine Ratio: 0.24 mg/mg{Cre} — ABNORMAL HIGH (ref 0.00–0.15)
Total Protein, Urine: 25 mg/dL

## 2019-10-17 LAB — COMPREHENSIVE METABOLIC PANEL
ALT: 19 U/L (ref 0–44)
AST: 22 U/L (ref 15–41)
Albumin: 2.7 g/dL — ABNORMAL LOW (ref 3.5–5.0)
Alkaline Phosphatase: 167 U/L — ABNORMAL HIGH (ref 38–126)
Anion gap: 10 (ref 5–15)
BUN: 6 mg/dL (ref 6–20)
CO2: 21 mmol/L — ABNORMAL LOW (ref 22–32)
Calcium: 8.5 mg/dL — ABNORMAL LOW (ref 8.9–10.3)
Chloride: 105 mmol/L (ref 98–111)
Creatinine, Ser: 0.5 mg/dL (ref 0.44–1.00)
GFR calc Af Amer: >60 mL/min (ref >60)
GFR calc non Af Amer: >60 mL/min (ref >60)
Glucose, Bld: 116 mg/dL — ABNORMAL HIGH (ref 70–99)
Potassium: 3.7 mmol/L (ref 3.5–5.1)
Sodium: 136 mmol/L (ref 135–145)
Total Bilirubin: 0.6 mg/dL (ref 0.3–1.2)
Total Protein: 6.2 g/dL — ABNORMAL LOW (ref 6.5–8.1)

## 2019-10-17 LAB — CBC
HCT: 35.3 % — ABNORMAL LOW (ref 36.0–46.0)
Hemoglobin: 11.5 g/dL — ABNORMAL LOW (ref 12.0–15.0)
MCH: 27 pg (ref 26.0–34.0)
MCHC: 32.6 g/dL (ref 30.0–36.0)
MCV: 82.9 fL (ref 80.0–100.0)
Platelets: 230 10*3/uL (ref 150–400)
RBC: 4.26 MIL/uL (ref 3.87–5.11)
RDW: 14.7 % (ref 11.5–15.5)
WBC: 11.1 10*3/uL — ABNORMAL HIGH (ref 4.0–10.5)
nRBC: 0 % (ref 0.0–0.2)

## 2019-10-17 MED ORDER — ACETAMINOPHEN 500 MG PO TABS
1000.0000 mg | ORAL_TABLET | Freq: Once | ORAL | Status: AC
  Administered 2019-10-17: 1000 mg via ORAL
  Filled 2019-10-17: qty 2

## 2019-10-17 NOTE — MAU Note (Addendum)
Pt sent from [facility] to be evaluated for hypertension. BP at office 152/97. Pt has chronic HTN.  Denies contractions, LOF, VB, +FM.  Has off and on headaches, but nothng currently. Denies vision changes, RUQ pain, some extra swelling in feet.

## 2019-10-17 NOTE — Progress Notes (Signed)
   NURSE VISIT- NST  SUBJECTIVE:  Tara Stark is a 39 y.o. G33P2002 female at [redacted]w[redacted]d, here for a NST for pregnancy complicated by [facility].  She reports active fetal movement, contractions: none, vaginal bleeding: none, membranes: intact.   OBJECTIVE:  BP (!) 152/97   Pulse (!) 108   Wt 193 lb 3.2 oz (87.6 kg)   LMP 01/14/2019   BMI 37.73 kg/m   Appears well, no apparent distress  No results found for this or any previous visit (from the past 24 hour(s)).  NST: FHR baseline 125 bpm, Variability: moderate, Accelerations:present, Decelerations:  Absent= Cat 1/Reactive Toco: UI   ASSESSMENT: [name] at [redacted]w[redacted]d with CHTN NST reactive  PLAN: EFM strip reviewed by Tara Stark, CNM, [facility]   Recommendations: sent to MAU, [place] notified.     Tara Stark  10/17/2019 10:40 AM

## 2019-10-17 NOTE — MAU Provider Note (Signed)
History     CSN: [id number]  Arrival date and time: 10/17/19 1248   First Provider Initiated Contact with Patient 10/17/19 1352      Chief Complaint  Patient presents with  . Hypertension   Ms. Tara Stark is a 39 y.o. G3P2002 at [redacted]w[redacted]d who presents to MAU for preeclampsia evaluation after she had elevated blood pressures at the clinic and was sent to MAU for evaluation. Patient reports a normal blood pressure for her is 130s-140s/80s at home.  Pt reports HA that started while waiting in MAU. Patient reports it is on her right side and "comes and goes real quick." Patient reports she took Tylenol last night and the headache went away, but she did not take anything for the headache today. Pt denies any headache diagnosis. Patient rates HA as 4/10 at this time.  Pt denies blurry vision/seeing spots, N/V, epigastric pain, swelling in face and hands, sudden weight gain. Pt denies chest pain and SOB.  Pt denies constipation, diarrhea, or urinary problems. Pt denies fever, chills, fatigue, sweating or changes in appetite. Pt denies dizziness, light-headedness, weakness.  Pt reports infrequent contractions. Pt denies VB, LOF and reports good FM.  Current pregnancy problems? CHTN, EFW 91%, fibroid, GBS positive Blood Type? A Positive Allergies? NKDA Current medications? Norvasc, VitD, PNV, bASA Current [facility] & next appt? [facility], Friday 10/20/2019, IOL scheduled 10/22/2019   OB History    Gravida  3   Para  2   Term  2   Preterm  0   AB  0   Living  2     SAB  0   TAB  0   Ectopic  0   Multiple  0   Live Births  2           Past Medical History:  Diagnosis Date  . Acid indigestion   . Fibroid   . Hemorrhoids   . Hypertension   . Thyroid disease   . Vitamin D deficiency disease 04/18/2019    Past Surgical History:  Procedure Laterality Date  . NO PAST SURGERIES      Family History  Problem Relation Age of Onset  . Other Paternal Grandfather         MVA  . Breast cancer Paternal Grandmother   . Alzheimer's disease Maternal Grandmother   . Cancer Maternal Grandfather        throat  . Diabetes Father   . Other Father        has a pacemaker  . Hypertension Mother   . Hypertension Brother   . Hypertension Sister   . Hypertension Sister   . Bronchiolitis Son   . Hypertension Paternal Aunt     Social History   Tobacco Use  . Smoking status: Former Smoker    Years: 4.00    Types: Cigarettes  . Smokeless tobacco: Never Used  . Tobacco comment: 2-3 cigarettes per day  Substance Use Topics  . Alcohol use: No  . Drug use: No    Allergies: No Known Allergies  Medications Prior to Admission  Medication Sig Dispense Refill Last Dose  . amLODipine (NORVASC) 10 MG tablet Take 1 tablet (10 mg total) by mouth daily. 30 tablet 4 10/17/2019 at Unknown time  . aspirin EC 81 MG tablet Take 2 tablets (162 mg total) by mouth daily. 60 tablet 10 10/16/2019 at Unknown time  . cholecalciferol (VITAMIN D3) 25 MCG (1000 UNIT) tablet Take 1,000 Units by mouth daily.  10/17/2019 at Unknown time  . Prenatal 27-1 MG TABS TAKE ONE TABLET AT NOON. 30 tablet 6 10/16/2019 at Unknown time  . Blood Pressure Monitor MISC For regular home bp monitoring during pregnancy 1 each 0     Review of Systems  Constitutional: Negative for chills, diaphoresis, fatigue and fever.  Eyes: Negative for visual disturbance.  Respiratory: Negative for shortness of breath.   Cardiovascular: Negative for chest pain.  Gastrointestinal: Negative for abdominal pain, constipation, diarrhea, nausea and vomiting.  Genitourinary: Negative for dysuria, flank pain, frequency, pelvic pain, urgency, vaginal bleeding and vaginal discharge.  Neurological: Positive for headaches. Negative for dizziness, weakness and light-headedness.   Physical Exam   Blood pressure 133/79, pulse 81, temperature 97.8 F (36.6 C), temperature source Oral, resp. rate 16, height 4\' 11"  (1.499 m),  weight 87.5 kg, last menstrual period 01/14/2019, SpO2 100 %, currently breastfeeding.  Patient Vitals for the past 24 hrs:  BP Temp Temp src Pulse Resp SpO2 Height Weight  10/17/19 1401 133/79 -- -- 81 -- -- -- --  10/17/19 1346 134/85 -- -- 86 -- -- -- --  10/17/19 1331 (!) 126/93 -- -- 81 -- -- -- --  10/17/19 1325 (!) 144/86 -- -- 86 -- -- -- --  10/17/19 1307 (!) 150/87 97.8 F (36.6 C) Oral 89 16 100 % 4\' 11"  (1.499 m) 87.5 kg   Physical Exam  Constitutional: She is oriented to person, place, and time. She appears well-developed and well-nourished. No distress.  HENT:  Head: Normocephalic and atraumatic.  Respiratory: Effort normal.  GI: Soft. She exhibits no distension and no mass. There is no abdominal tenderness. There is no rebound and no guarding.  Neurological: She is alert and oriented to person, place, and time.  Skin: Skin is warm and dry. She is not diaphoretic.  Psychiatric: She has a normal mood and affect. Her behavior is normal. Judgment and thought content normal.   Results for orders placed or performed during the hospital encounter of 10/17/19 (from the past 24 hour(s))  Protein / creatinine ratio, urine     Status: Abnormal   Collection Time: 10/17/19  1:00 PM  Result Value Ref Range   Creatinine, Urine 103.50 mg/dL   Total Protein, Urine 25 mg/dL   Protein Creatinine Ratio 0.24 (H) 0.00 - 0.15 mg/mg[Cre]  CBC     Status: Abnormal   Collection Time: 10/17/19  1:34 PM  Result Value Ref Range   WBC 11.1 (H) 4.0 - 10.5 K/uL   RBC 4.26 3.87 - 5.11 MIL/uL   Hemoglobin 11.5 (L) 12.0 - 15.0 g/dL   HCT 35.3 (L) 36.0 - 46.0 %   MCV 82.9 80.0 - 100.0 fL   MCH 27.0 26.0 - 34.0 pg   MCHC 32.6 30.0 - 36.0 g/dL   RDW 14.7 11.5 - 15.5 %   Platelets 230 150 - 400 K/uL   nRBC 0.0 0.0 - 0.2 %  Comprehensive metabolic panel     Status: Abnormal   Collection Time: 10/17/19  1:34 PM  Result Value Ref Range   Sodium 136 135 - 145 mmol/L   Potassium 3.7 3.5 - 5.1 mmol/L    Chloride 105 98 - 111 mmol/L   CO2 21 (L) 22 - 32 mmol/L   Glucose, Bld 116 (H) 70 - 99 mg/dL   BUN 6 6 - 20 mg/dL   Creatinine, Ser 0.50 0.44 - 1.00 mg/dL   Calcium 8.5 (L) 8.9 - 10.3 mg/dL   Total Protein  6.2 (L) 6.5 - 8.1 g/dL   Albumin 2.7 (L) 3.5 - 5.0 g/dL   AST 22 15 - 41 U/L   ALT 19 0 - 44 U/L   Alkaline Phosphatase 167 (H) 38 - 126 U/L   Total Bilirubin 0.6 0.3 - 1.2 mg/dL   GFR calc non Af Amer >60 >60 mL/min   GFR calc Af Amer >60 >60 mL/min   Anion gap 10 5 - 15    MAU Course  Procedures  MDM -preeclampsia evaluation with 4/10 HA in MAU -CBC: WNL, platelets 230 -CMP: WNL, serum creatinine 0.50, AST/ALT 22/19 -PCr: 0.24 -Tylenol 1000mg  given, pt reports HA now 0/10 -EFM: reactive       -baseline: 130       -variability: moderate       -accels: present, 15x15       -decels: absent       -TOCO: few, irregular ctx -consulted with Dr. Elly Modena, pt OK to be discharged home. -pt discharged to home in stable condition  Orders Placed This Encounter  Procedures  . CBC    Standing Status:   Standing    Number of Occurrences:   1  . Comprehensive metabolic panel    Standing Status:   Standing    Number of Occurrences:   1  . Protein / creatinine ratio, urine    Standing Status:   Standing    Number of Occurrences:   1  . Discharge patient    Order Specific Question:   Discharge disposition    Answer:   01-Home or Self Care [1]    Order Specific Question:   Discharge patient date    Answer:   10/17/2019   Meds ordered this encounter  Medications  . acetaminophen (TYLENOL) tablet 1,000 mg   Assessment and Plan   1. Chronic hypertension affecting pregnancy   2. [redacted] weeks gestation of pregnancy   3. NST (non-stress test) reactive   4. Pregnancy headache in third trimester    Allergies as of 10/17/2019   No Known Allergies     Medication List    TAKE these medications   amLODipine 10 MG tablet Commonly known as: Norvasc Take 1 tablet (10 mg total)  by mouth daily.   aspirin EC 81 MG tablet Take 2 tablets (162 mg total) by mouth daily.   Blood Pressure Monitor Misc For regular home bp monitoring during pregnancy   cholecalciferol 25 MCG (1000 UNIT) tablet Commonly known as: VITAMIN D3 Take 1,000 Units by mouth daily.   Prenatal 27-1 MG Tabs TAKE ONE TABLET AT NOON.      -pt to keep appt on Friday with [facility] -s/sx of preeclampsia discussed, pt to return to MAU if symptoms occur or BP elevated in severe range -return MAU precautions given -pt discharged to home in stable condition  Gerrie Nordmann Letrice Pollok 10/17/2019, 3:47 PM

## 2019-10-17 NOTE — Discharge Instructions (Signed)
Hypertension During Pregnancy High blood pressure (hypertension) is when the force of blood pumping through the arteries is too strong. Arteries are blood vessels that carry blood from the heart throughout the body. Hypertension during pregnancy can be mild or severe. Severe hypertension during pregnancy (preeclampsia) is a medical emergency that requires prompt evaluation and treatment. Different types of hypertension can happen during pregnancy. These include:  Chronic hypertension. This happens when you had high blood pressure before you became pregnant, and it continues during the pregnancy. Hypertension that develops before you are [redacted] weeks pregnant and continues during the pregnancy is also called chronic hypertension. If you have chronic hypertension, it will not go away after you have your baby. You will need follow-up visits with your health care provider after you have your baby. Your doctor may want you to keep taking medicine for your blood pressure.  Gestational hypertension. This is hypertension that develops after the 20th week of pregnancy. Gestational hypertension usually goes away after you have your baby, but your health care provider will need to monitor your blood pressure to make sure that it is getting better.  Preeclampsia. This is severe hypertension during pregnancy. This can cause serious complications for you and your baby and can also cause complications for you after the delivery of your baby.  Postpartum preeclampsia. You may develop severe hypertension after giving birth. This usually occurs within 48 hours after childbirth but may occur up to 6 weeks after giving birth. This is rare. How does this affect me? Women who have hypertension during pregnancy have a greater chance of developing hypertension later in life or during future pregnancies. In some cases, hypertension during pregnancy can cause serious complications, such as:  Stroke.  Heart attack.  Injury to  other organs, such as kidneys, lungs, or liver.  Preeclampsia.  Convulsions or seizures.  Placental abruption. How does this affect my baby? Hypertension during pregnancy can affect your baby. Your baby may:  Be born early (prematurely).  Not weigh as much as he or she should at birth (low birth weight).  Not tolerate labor well, leading to an unplanned cesarean delivery. What are the risks? There are certain factors that make it more likely for you to develop hypertension during pregnancy. These include:  Having hypertension during a previous pregnancy.  Being overweight.  Being age 35 or older.  Being pregnant for the first time.  Being pregnant with more than one baby.  Becoming pregnant using fertilization methods, such as IVF (in vitro fertilization).  Having other medical problems, such as diabetes, kidney disease, or lupus.  Having a family history of hypertension. What can I do to lower my risk? The exact cause of hypertension during pregnancy is not known. You may be able to lower your risk by:  Maintaining a healthy weight.  Eating a healthy and balanced diet.  Following your health care provider's instructions about treating any long-term conditions that you had before becoming pregnant. It is very important to keep all of your prenatal care appointments. Your health care provider will check your blood pressure and make sure that your pregnancy is progressing as expected. If a problem is found, early treatment can prevent complications. How is this treated? Treatment for hypertension during pregnancy varies depending on the type of hypertension you have and how serious it is.  If you were taking medicine for high blood pressure before you became pregnant, talk with your health care provider. You may need to change medicine during pregnancy because   some medicines, like ACE inhibitors, may not be considered safe for your baby.  If you have gestational  hypertension, your health care provider may order medicine to treat this during pregnancy.  If you are at risk for preeclampsia, your health care provider may recommend that you take a low-dose aspirin during your pregnancy.  If you have severe hypertension, you may need to be hospitalized so you and your baby can be monitored closely. You may also need to be given medicine to lower your blood pressure. This medicine may be given by mouth or through an IV.  In some cases, if your condition gets worse, you may need to deliver your baby early. Follow these instructions at home: Eating and drinking   Drink enough fluid to keep your urine pale yellow.  Avoid caffeine. Lifestyle  Do not use any products that contain nicotine or tobacco, such as cigarettes, e-cigarettes, and chewing tobacco. If you need help quitting, ask your health care provider.  Do not use alcohol or drugs.  Avoid stress as much as possible.  Rest and get plenty of sleep.  Regular exercise can help to reduce your blood pressure. Ask your health care provider what kinds of exercise are best for you. General instructions  Take over-the-counter and prescription medicines only as told by your health care provider.  Keep all prenatal and follow-up visits as told by your health care provider. This is important. Contact a health care provider if:  You have symptoms that your health care provider told you may require more treatment or monitoring, such as: ? Headaches. ? Nausea or vomiting. ? Abdominal pain. ? Dizziness. ? Light-headedness. Get help right away if:  You have: ? Severe abdominal pain that does not get better with treatment. ? A severe headache that does not get better. ? Vomiting that does not get better. ? Sudden, rapid weight gain. ? Sudden swelling in your hands, ankles, or face. ? Vaginal bleeding. ? Blood in your urine. ? Blurred or double vision. ? Shortness of breath or chest  pain. ? Weakness on one side of your body. ? Difficulty speaking.  Your baby is not moving as much as usual. Summary  High blood pressure (hypertension) is when the force of blood pumping through the arteries is too strong.  Hypertension during pregnancy can cause problems for you and your baby.  Treatment for hypertension during pregnancy varies depending on the type of hypertension you have and how serious it is.  Keep all prenatal and follow-up visits as told by your health care provider. This is important. This information is not intended to replace advice given to you by your health care provider. Make sure you discuss any questions you have with your health care provider. Document Revised: 11/03/2018 Document Reviewed: 08/09/2018 Elsevier Patient Education  [place]. Preeclampsia and Eclampsia Preeclampsia is a serious condition that may develop during pregnancy. This condition causes high blood pressure and increased protein in your urine along with other symptoms, such as headaches and vision changes. These symptoms may develop as the condition gets worse. Preeclampsia may occur at 20 weeks of pregnancy or later. Diagnosing and treating preeclampsia early is very important. If not treated early, it can cause serious problems for you and your baby. One problem it can lead to is eclampsia. Eclampsia is a condition that causes muscle jerking or shaking (convulsions or seizures) and other serious problems for the mother. During pregnancy, delivering your baby may be the best treatment for preeclampsia  or eclampsia. For most women, preeclampsia and eclampsia symptoms go away after giving birth. In rare cases, a woman may develop preeclampsia after giving birth (postpartum preeclampsia). This usually occurs within 48 hours after childbirth but may occur up to 6 weeks after giving birth. What are the causes? The cause of preeclampsia is not known. What increases the risk? The  following risk factors make you more likely to develop preeclampsia:  Being pregnant for the first time.  Having had preeclampsia during a past pregnancy.  Having a family history of preeclampsia.  Having high blood pressure.  Being pregnant with more than one baby.  Being 47 or older.  Being African-American.  Having kidney disease or diabetes.  Having medical conditions such as lupus or blood diseases.  Being very overweight (obese). What are the signs or symptoms? The most common symptoms are:  Severe headaches.  Vision problems, such as blurred or double vision.  Abdominal pain, especially upper abdominal pain. Other symptoms that may develop as the condition gets worse include:  Sudden weight gain.  Sudden swelling of the hands, face, legs, and feet.  Severe nausea and vomiting.  Numbness in the face, arms, legs, and feet.  Dizziness.  Urinating less than usual.  Slurred speech.  Convulsions or seizures. How is this diagnosed? There are no screening tests for preeclampsia. Your health care provider will ask you about symptoms and check for signs of preeclampsia during your prenatal visits. You may also have tests that include:  Checking your blood pressure.  Urine tests to check for protein. Your health care provider will check for this at every prenatal visit.  Blood tests.  Monitoring your baby's heart rate.  Ultrasound. How is this treated? You and your health care provider will determine the treatment approach that is best for you. Treatment may include:  Having more frequent prenatal exams to check for signs of preeclampsia, if you have an increased risk for preeclampsia.  Medicine to lower your blood pressure.  Staying in the hospital, if your condition is severe. There, treatment will focus on controlling your blood pressure and the amount of fluids in your body (fluid retention).  Taking medicine (magnesium sulfate) to prevent seizures.  This may be given as an injection or through an IV.  Taking a low-dose aspirin during your pregnancy.  Delivering your baby early. You may have your labor started with medicine (induced), or you may have a cesarean delivery. Follow these instructions at home: Eating and drinking   Drink enough fluid to keep your urine pale yellow.  Avoid caffeine. Lifestyle  Do not use any products that contain nicotine or tobacco, such as cigarettes and e-cigarettes. If you need help quitting, ask your health care provider.  Do not use alcohol or drugs.  Avoid stress as much as possible. Rest and get plenty of sleep. General instructions  Take over-the-counter and prescription medicines only as told by your health care provider.  When lying down, lie on your left side. This keeps pressure off your major blood vessels.  When sitting or lying down, raise (elevate) your feet. Try putting some pillows underneath your lower legs.  Exercise regularly. Ask your health care provider what kinds of exercise are best for you.  Keep all follow-up and prenatal visits as told by your health care provider. This is important. How is this prevented? There is no known way of preventing preeclampsia or eclampsia from developing. However, to lower your risk of complications and detect problems early:  Get regular prenatal care. Your health care provider may be able to diagnose and treat the condition early.  Maintain a healthy weight. Ask your health care provider for help managing weight gain during pregnancy.  Work with your health care provider to manage any long-term (chronic) health conditions you have, such as diabetes or kidney problems.  You may have tests of your blood pressure and kidney function after giving birth.  Your health care provider may have you take low-dose aspirin during your next pregnancy. Contact a health care provider if:  You have symptoms that your health care provider told you  may require more treatment or monitoring, such as: ? Headaches. ? Nausea or vomiting. ? Abdominal pain. ? Dizziness. ? Light-headedness. Get help right away if:  You have severe: ? Abdominal pain. ? Headaches that do not get better. ? Dizziness. ? Vision problems. ? Confusion. ? Nausea or vomiting.  You have any of the following: ? A seizure. ? Sudden, rapid weight gain. ? Sudden swelling in your hands, ankles, or face. ? Trouble moving any part of your body. ? Numbness in any part of your body. ? Trouble speaking. ? Abnormal bleeding.  You faint. Summary  Preeclampsia is a serious condition that may develop during pregnancy.  This condition causes high blood pressure and increased protein in your urine along with other symptoms, such as headaches and vision changes.  Diagnosing and treating preeclampsia early is very important. If not treated early, it can cause serious problems for you and your baby.  Get help right away if you have symptoms that your health care provider told you to watch for. This information is not intended to replace advice given to you by your health care provider. Make sure you discuss any questions you have with your health care provider. Document Revised: 03/15/2018 Document Reviewed: 02/17/2016 Elsevier Patient Education  [place]. Signs and Symptoms of Labor Labor is your body's natural process of moving your baby, placenta, and umbilical cord out of your uterus. The process of labor usually starts when your baby is full-term, between 62 and 40 weeks of pregnancy. How will I know when I am close to going into labor? As your body prepares for labor and the birth of your baby, you may notice the following symptoms in the weeks and days before true labor starts:  Having a strong desire to get your home ready to receive your new baby. This is called nesting. Nesting may be a sign that labor is approaching, and it may occur several weeks  before birth. Nesting may involve cleaning and organizing your home.  Passing a small amount of thick, bloody mucus out of your vagina (normal bloody show or losing your mucus plug). This may happen more than a week before labor begins, or it might occur right before labor begins as the opening of the cervix starts to widen (dilate). For some women, the entire mucus plug passes at once. For others, smaller portions of the mucus plug may gradually pass over several days.  Your baby moving (dropping) lower in your pelvis to get into position for birth (lightening). When this happens, you may feel more pressure on your bladder and pelvic bone and less pressure on your ribs. This may make it easier to breathe. It may also cause you to need to urinate more often and have problems with bowel movements.  Having "practice contractions" (Braxton Hicks contractions) that occur at irregular (unevenly spaced) intervals that are more than  10 minutes apart. This is also called false labor. False labor contractions are common after exercise or sexual activity, and they will stop if you change position, rest, or drink fluids. These contractions are usually mild and do not get stronger over time. They may feel like: ? A backache or back pain. ? Mild cramps, similar to menstrual cramps. ? Tightening or pressure in your abdomen. Other early symptoms that labor may be starting soon include:  Nausea or loss of appetite.  Diarrhea.  Having a sudden burst of energy, or feeling very tired.  Mood changes.  Having trouble sleeping. How will I know when labor has begun? Signs that true labor has begun may include:  Having contractions that come at regular (evenly spaced) intervals and increase in intensity. This may feel like more intense tightening or pressure in your abdomen that moves to your back. ? Contractions may also feel like rhythmic pain in your upper thighs or back that comes and goes at regular  intervals. ? For first-time mothers, this change in intensity of contractions often occurs at a more gradual pace. ? Women who have given birth before may notice a more rapid progression of contraction changes.  Having a feeling of pressure in the vaginal area.  Your water breaking (rupture of membranes). This is when the sac of fluid that surrounds your baby breaks. When this happens, you will notice fluid leaking from your vagina. This may be clear or blood-tinged. Labor usually starts within 24 hours of your water breaking, but it may take longer to begin. ? Some women notice this as a gush of fluid. ? Others notice that their underwear repeatedly becomes damp. Follow these instructions at home:   When labor starts, or if your water breaks, call your health care provider or nurse care line. Based on your situation, they will determine when you should go in for an exam.  When you are in early labor, you may be able to rest and manage symptoms at home. Some strategies to try at home include: ? Breathing and relaxation techniques. ? Taking a warm bath or shower. ? Listening to music. ? Using a heating pad on the lower back for pain. If you are directed to use heat:  Place a towel between your skin and the heat source.  Leave the heat on for 20-30 minutes.  Remove the heat if your skin turns bright red. This is especially important if you are unable to feel pain, heat, or cold. You may have a greater risk of getting burned. Get help right away if:  You have painful, regular contractions that are 5 minutes apart or less.  Labor starts before you are [redacted] weeks along in your pregnancy.  You have a fever.  You have a headache that does not go away.  You have bright red blood coming from your vagina.  You do not feel your baby moving.  You have a sudden onset of: ? Severe headache with vision problems. ? Nausea, vomiting, or diarrhea. ? Chest pain or shortness of breath. These  symptoms may be an emergency. If your health care provider recommends that you go to the hospital or birth center where you plan to deliver, do not drive yourself. Have someone else drive you, or call emergency services (911 in the [place] Summary  Labor is your body's natural process of moving your baby, placenta, and umbilical cord out of your uterus.  The process of labor usually starts when your baby is  full-term, between 25 and 40 weeks of pregnancy.  When labor starts, or if your water breaks, call your health care provider or nurse care line. Based on your situation, they will determine when you should go in for an exam. This information is not intended to replace advice given to you by your health care provider. Make sure you discuss any questions you have with your health care provider. Document Revised: 04/12/2017 Document Reviewed: 12/18/2016 Elsevier Patient Education  [place].

## 2019-10-18 ENCOUNTER — Other Ambulatory Visit: Payer: Self-pay

## 2019-10-18 ENCOUNTER — Encounter (HOSPITAL_COMMUNITY): Payer: Self-pay | Admitting: Obstetrics & Gynecology

## 2019-10-18 ENCOUNTER — Telehealth: Payer: Self-pay | Admitting: *Deleted

## 2019-10-18 ENCOUNTER — Inpatient Hospital Stay (HOSPITAL_COMMUNITY)
Admission: AD | Admit: 2019-10-18 | Discharge: 2019-10-18 | Disposition: A | Discharge status: Home / Self Care | Payer: Medicaid Other | Attending: Obstetrics & Gynecology | Admitting: Obstetrics & Gynecology

## 2019-10-18 DIAGNOSIS — O10013 Pre-existing essential hypertension complicating pregnancy, third trimester: Secondary | ICD-10-CM | POA: Insufficient documentation

## 2019-10-18 DIAGNOSIS — R519 Headache, unspecified: Secondary | ICD-10-CM | POA: Diagnosis not present

## 2019-10-18 DIAGNOSIS — O26893 Other specified pregnancy related conditions, third trimester: Secondary | ICD-10-CM | POA: Diagnosis not present

## 2019-10-18 DIAGNOSIS — Z87891 Personal history of nicotine dependence: Secondary | ICD-10-CM | POA: Diagnosis not present

## 2019-10-18 DIAGNOSIS — I10 Essential (primary) hypertension: Secondary | ICD-10-CM

## 2019-10-18 DIAGNOSIS — Z3A37 37 weeks gestation of pregnancy: Secondary | ICD-10-CM | POA: Insufficient documentation

## 2019-10-18 LAB — COMPREHENSIVE METABOLIC PANEL
ALT: 20 U/L (ref 0–44)
AST: 22 U/L (ref 15–41)
Albumin: 2.6 g/dL — ABNORMAL LOW (ref 3.5–5.0)
Alkaline Phosphatase: 181 U/L — ABNORMAL HIGH (ref 38–126)
Anion gap: 8 (ref 5–15)
BUN: <5 mg/dL — ABNORMAL LOW (ref 6–20)
CO2: 22 mmol/L (ref 22–32)
Calcium: 8.8 mg/dL — ABNORMAL LOW (ref 8.9–10.3)
Chloride: 108 mmol/L (ref 98–111)
Creatinine, Ser: 0.45 mg/dL (ref 0.44–1.00)
GFR calc Af Amer: >60 mL/min (ref >60)
GFR calc non Af Amer: >60 mL/min (ref >60)
Glucose, Bld: 108 mg/dL — ABNORMAL HIGH (ref 70–99)
Potassium: 3.5 mmol/L (ref 3.5–5.1)
Sodium: 138 mmol/L (ref 135–145)
Total Bilirubin: 0.6 mg/dL (ref 0.3–1.2)
Total Protein: 6 g/dL — ABNORMAL LOW (ref 6.5–8.1)

## 2019-10-18 LAB — CBC
HCT: 34.7 % — ABNORMAL LOW (ref 36.0–46.0)
Hemoglobin: 11.5 g/dL — ABNORMAL LOW (ref 12.0–15.0)
MCH: 26.9 pg (ref 26.0–34.0)
MCHC: 33.1 g/dL (ref 30.0–36.0)
MCV: 81.3 fL (ref 80.0–100.0)
Platelets: 214 10*3/uL (ref 150–400)
RBC: 4.27 MIL/uL (ref 3.87–5.11)
RDW: 14.8 % (ref 11.5–15.5)
WBC: 11.5 10*3/uL — ABNORMAL HIGH (ref 4.0–10.5)
nRBC: 0 % (ref 0.0–0.2)

## 2019-10-18 LAB — URINALYSIS, ROUTINE W REFLEX MICROSCOPIC
Bilirubin Urine: NEGATIVE
Glucose, UA: NEGATIVE mg/dL
Hgb urine dipstick: NEGATIVE
Ketones, ur: NEGATIVE mg/dL
Nitrite: NEGATIVE
Protein, ur: 30 mg/dL — AB
Specific Gravity, Urine: 1.016 (ref 1.005–1.030)
pH: 6 (ref 5.0–8.0)

## 2019-10-18 LAB — PROTEIN / CREATININE RATIO, URINE
Creatinine, Urine: 128.19 mg/dL
Protein Creatinine Ratio: 0.25 mg/mg{Cre} — ABNORMAL HIGH (ref 0.00–0.15)
Total Protein, Urine: 32 mg/dL

## 2019-10-18 MED ORDER — BUTALBITAL-APAP-CAFFEINE 50-325-40 MG PO TABS
2.0000 | ORAL_TABLET | Freq: Once | ORAL | Status: AC
  Administered 2019-10-18: 2 via ORAL
  Filled 2019-10-18: qty 2

## 2019-10-18 MED ORDER — BUTALBITAL-APAP-CAFFEINE 50-325-40 MG PO TABS: 1.0000 | ORAL_TABLET | Freq: Four times a day (QID) | ORAL | 0 refills | Status: DC | PRN

## 2019-10-18 NOTE — Telephone Encounter (Signed)
Pt called stating that she was seeing floaters in her vision and her bp is running 150's/100's. Advised patient to go to [facility] for evaluation. Patient agreeable.

## 2019-10-18 NOTE — [facility] Note (Signed)
Started seeing spots and had h/a and felt lightheaded. Laid down and symptoms continued. B/P at home 159/110 and 150/110. Symptoms started about an hour ago. Last tylenol was earlier afternoon but no meds since h/a started. For IOL Sunday

## 2019-10-18 NOTE — MAU Provider Note (Signed)
Chief Complaint:  Hypertension, Headache, and Visual Field Change   First Provider Initiated Contact with Patient 10/18/19 2009      HPI: Tara Stark is a 39 y.o. G3P2002 at [redacted]w[redacted]d by early ultrasound who presents to maternity admissions reporting HTN at home with new onset today of h/a and visual changes with seeing spots.  She reports the h/a is right sided temporal and occipital.  It is not associated with n/v or photophobia.  She reports floaters today, described as moving around in her visual field.  She took Tylenol this morning and the h/a improved but returned 3-4 hours later.  There are no other symptoms. She has not tried any treatments.  She reports good fetal movement.  HPI  Past Medical History: Past Medical History:  Diagnosis Date  . Acid indigestion   . Fibroid   . Hemorrhoids   . Hypertension   . Thyroid disease   . Vitamin D deficiency disease 04/18/2019    Past obstetric history: OB History  Gravida Para Term Preterm AB Living  3 2 2  0 0 2  SAB TAB Ectopic Multiple Live Births  0 0 0 0 2    # Outcome Date GA Lbr Len/2nd Weight Sex Delivery Anes PTL Lv  3 Current           2 Term 10/04/18 [redacted]w[redacted]d 14:09 / 00:22 3379 g F Vag-Spont EPI  LIV     Birth Comments: moulding  1 Term 04/03/17 [redacted]w[redacted]d  2013 g M Vag-Spont EPI N LIV     Complications: Pre-eclampsia, History of cervical cerclage    Past Surgical History: Past Surgical History:  Procedure Laterality Date  . NO PAST SURGERIES      Family History: Family History  Problem Relation Age of Onset  . Other Paternal Grandfather        MVA  . Breast cancer Paternal Grandmother   . Alzheimer's disease Maternal Grandmother   . Cancer Maternal Grandfather        throat  . Diabetes Father   . Other Father        has a pacemaker  . Hypertension Mother   . Hypertension Brother   . Hypertension Sister   . Hypertension Sister   . Bronchiolitis Son   . Hypertension Paternal Aunt     Social  History: Social History   Tobacco Use  . Smoking status: Former Smoker    Years: 4.00    Types: Cigarettes  . Smokeless tobacco: Never Used  . Tobacco comment: 2-3 cigarettes per day  Substance Use Topics  . Alcohol use: No  . Drug use: No    Allergies: No Known Allergies  Meds:  Medications Prior to Admission  Medication Sig Dispense Refill Last Dose  . amLODipine (NORVASC) 10 MG tablet Take 1 tablet (10 mg total) by mouth daily. 30 tablet 4 10/18/2019 at Unknown time  . aspirin EC 81 MG tablet Take 2 tablets (162 mg total) by mouth daily. 60 tablet 10 10/18/2019 at Unknown time  . Blood Pressure Monitor MISC For regular home bp monitoring during pregnancy 1 each 0 10/18/2019 at Unknown time  . cholecalciferol (VITAMIN D3) 25 MCG (1000 UNIT) tablet Take 1,000 Units by mouth daily.   10/18/2019 at Unknown time  . Prenatal 27-1 MG TABS TAKE ONE TABLET AT NOON. 30 tablet 6 10/18/2019 at Unknown time    ROS:  Review of Systems  Constitutional: Negative for chills, fatigue and fever.  Eyes: Positive for visual  disturbance.  Respiratory: Negative for shortness of breath.   Cardiovascular: Negative for chest pain.  Gastrointestinal: Negative for abdominal pain, nausea and vomiting.  Genitourinary: Negative for difficulty urinating, dysuria, flank pain, pelvic pain, vaginal bleeding, vaginal discharge and vaginal pain.  Neurological: Positive for headaches. Negative for dizziness.  Psychiatric/Behavioral: Negative.      I have reviewed patient's Past Medical Hx, Surgical Hx, Family Hx, Social Hx, medications and allergies.   Physical Exam   Patient Vitals for the past 24 hrs:  BP Temp Pulse Resp Height Weight  10/18/19 2146 (!) 135/93 -- 80 -- -- --  10/18/19 2131 130/80 -- 85 -- -- --  10/18/19 2116 126/81 -- 87 -- -- --  10/18/19 2101 128/80 -- 83 -- -- --  10/18/19 2046 134/80 -- 90 -- -- --  10/18/19 2031 133/77 -- 88 -- -- --  10/18/19 2016 135/81 -- 87 -- -- --  10/18/19  2001 133/80 -- 83 -- -- --  10/18/19 1943 136/80 -- (!) 101 -- -- --  10/18/19 1920 138/87 -- 89 -- -- --  10/18/19 1915 -- 98.4 F (36.9 C) -- 16 4\' 11"  (1.499 m) 87.1 kg   Constitutional: Well-developed, well-nourished female in no acute distress.  Cardiovascular: normal rate Respiratory: normal effort GI: Abd soft, non-tender, gravid appropriate for gestational age.  MS: Extremities nontender, no edema, normal ROM Neurologic: Alert and oriented x 4.  GU: Neg CVAT.  PELVIC EXAM: Deferred     FHT:  Baseline 135 , moderate variability, accelerations present, no decelerations Contractions: irritability    Labs: Results for orders placed or performed during the hospital encounter of 10/18/19 (from the past 24 hour(s))  Urinalysis, Routine w reflex microscopic     Status: Abnormal   Collection Time: 10/18/19  7:49 PM  Result Value Ref Range   Color, Urine YELLOW YELLOW   APPearance HAZY (A) CLEAR   Specific Gravity, Urine 1.016 1.005 - 1.030   pH 6.0 5.0 - 8.0   Glucose, UA NEGATIVE NEGATIVE mg/dL   Hgb urine dipstick NEGATIVE NEGATIVE   Bilirubin Urine NEGATIVE NEGATIVE   Ketones, ur NEGATIVE NEGATIVE mg/dL   Protein, ur 30 (A) NEGATIVE mg/dL   Nitrite NEGATIVE NEGATIVE   Leukocytes,Ua LARGE (A) NEGATIVE   RBC / HPF 0-5 0 - 5 RBC/hpf   WBC, UA 0-5 0 - 5 WBC/hpf   Bacteria, UA RARE (A) NONE SEEN   Squamous Epithelial / LPF 11-20 0 - 5   Mucus PRESENT    Ca Oxalate Crys, UA PRESENT   Protein / creatinine ratio, urine     Status: Abnormal   Collection Time: 10/18/19  8:03 PM  Result Value Ref Range   Creatinine, Urine 128.19 mg/dL   Total Protein, Urine 32 mg/dL   Protein Creatinine Ratio 0.25 (H) 0.00 - 0.15 mg/mg[Cre]  CBC     Status: Abnormal   Collection Time: 10/18/19  8:10 PM  Result Value Ref Range   WBC 11.5 (H) 4.0 - 10.5 K/uL   RBC 4.27 3.87 - 5.11 MIL/uL   Hemoglobin 11.5 (L) 12.0 - 15.0 g/dL   HCT 34.7 (L) 36.0 - 46.0 %   MCV 81.3 80.0 - 100.0 fL   MCH  26.9 26.0 - 34.0 pg   MCHC 33.1 30.0 - 36.0 g/dL   RDW 14.8 11.5 - 15.5 %   Platelets 214 150 - 400 K/uL   nRBC 0.0 0.0 - 0.2 %  Comprehensive metabolic panel  Status: Abnormal   Collection Time: 10/18/19  8:10 PM  Result Value Ref Range   Sodium 138 135 - 145 mmol/L   Potassium 3.5 3.5 - 5.1 mmol/L   Chloride 108 98 - 111 mmol/L   CO2 22 22 - 32 mmol/L   Glucose, Bld 108 (H) 70 - 99 mg/dL   BUN <5 (L) 6 - 20 mg/dL   Creatinine, Ser 0.45 0.44 - 1.00 mg/dL   Calcium 8.8 (L) 8.9 - 10.3 mg/dL   Total Protein 6.0 (L) 6.5 - 8.1 g/dL   Albumin 2.6 (L) 3.5 - 5.0 g/dL   AST 22 15 - 41 U/L   ALT 20 0 - 44 U/L   Alkaline Phosphatase 181 (H) 38 - 126 U/L   Total Bilirubin 0.6 0.3 - 1.2 mg/dL   GFR calc non Af Amer >60 >60 mL/min   GFR calc Af Amer >60 >60 mL/min   Anion gap 8 5 - 15   A/Positive/-- (09/01 0000)  Imaging:    MAU Course/MDM: Orders Placed This Encounter  Procedures  . Urinalysis, Routine w reflex microscopic  . CBC  . Comprehensive metabolic panel  . Protein / creatinine ratio, urine  . Discharge patient    Meds ordered this encounter  Medications  . butalbital-acetaminophen-caffeine (FIORICET) 50-325-40 MG per tablet 2 tablet     NST reviewed and reactive Fioricet 2 tablets given with complete resolution of headache/visual symptoms PEC labs wnl, P/C ratio unchanged from previous labs Consult Anyanwu with presentation, exam findings and test results.  Keep BP check on 10/20/19 in the office and IOL on 10/22/19. Pt discharge with strict PEC precautions.    Assessment: 1. Headache in pregnancy, antepartum, third trimester   2. Chronic hypertension     Plan: Discharge home Labor precautions and fetal kick counts Follow-up Information    [facility] Follow up.   Specialty: Obstetrics and Gynecology Why: As scheduled 10/20/19 for blood pressure check. Return to MAU as needed for emergencies.  Contact information: [address] [place] [phone]         Allergies as of 10/18/2019   No Known Allergies     Medication List    TAKE these medications   amLODipine 10 MG tablet Commonly known as: Norvasc Take 1 tablet (10 mg total) by mouth daily.   aspirin EC 81 MG tablet Take 2 tablets (162 mg total) by mouth daily.   Blood Pressure Monitor Misc For regular home bp monitoring during pregnancy   cholecalciferol 25 MCG (1000 UNIT) tablet Commonly known as: VITAMIN D3 Take 1,000 Units by mouth daily.   Prenatal 27-1 MG Tabs TAKE ONE TABLET AT NOON.       Fatima Blank Certified Nurse-Midwife 10/18/2019 9:57 PM

## 2019-10-19 ENCOUNTER — Other Ambulatory Visit: Payer: Self-pay | Admitting: Women's Health

## 2019-10-19 DIAGNOSIS — O10919 Unspecified pre-existing hypertension complicating pregnancy, unspecified trimester: Secondary | ICD-10-CM

## 2019-10-20 ENCOUNTER — Ambulatory Visit (INDEPENDENT_AMBULATORY_CARE_PROVIDER_SITE_OTHER): Payer: Medicaid Other | Admitting: Obstetrics & Gynecology

## 2019-10-20 ENCOUNTER — Other Ambulatory Visit (HOSPITAL_COMMUNITY)
Admission: RE | Admit: 2019-10-20 | Discharge: 2019-10-20 | Disposition: A | Discharge status: Home / Self Care | Payer: Medicaid Other | Source: Ambulatory Visit | Attending: Obstetrics and Gynecology | Admitting: Obstetrics and Gynecology

## 2019-10-20 ENCOUNTER — Ambulatory Visit (INDEPENDENT_AMBULATORY_CARE_PROVIDER_SITE_OTHER): Payer: Medicaid Other

## 2019-10-20 ENCOUNTER — Other Ambulatory Visit: Payer: Self-pay

## 2019-10-20 ENCOUNTER — Encounter: Payer: Self-pay | Admitting: Obstetrics & Gynecology

## 2019-10-20 VITALS — BP 138/91 | HR 92 | Wt 192.0 lb

## 2019-10-20 DIAGNOSIS — Z3A37 37 weeks gestation of pregnancy: Secondary | ICD-10-CM

## 2019-10-20 DIAGNOSIS — Z01812 Encounter for preprocedural laboratory examination: Secondary | ICD-10-CM | POA: Diagnosis not present

## 2019-10-20 DIAGNOSIS — Z20822 Contact with and (suspected) exposure to covid-19: Secondary | ICD-10-CM | POA: Diagnosis not present

## 2019-10-20 DIAGNOSIS — Z1389 Encounter for screening for other disorder: Secondary | ICD-10-CM

## 2019-10-20 DIAGNOSIS — O10919 Unspecified pre-existing hypertension complicating pregnancy, unspecified trimester: Secondary | ICD-10-CM

## 2019-10-20 DIAGNOSIS — O0990 Supervision of high risk pregnancy, unspecified, unspecified trimester: Secondary | ICD-10-CM

## 2019-10-20 DIAGNOSIS — O10913 Unspecified pre-existing hypertension complicating pregnancy, third trimester: Secondary | ICD-10-CM

## 2019-10-20 DIAGNOSIS — I10 Essential (primary) hypertension: Secondary | ICD-10-CM

## 2019-10-20 DIAGNOSIS — O10013 Pre-existing essential hypertension complicating pregnancy, third trimester: Secondary | ICD-10-CM

## 2019-10-20 DIAGNOSIS — Z331 Pregnant state, incidental: Secondary | ICD-10-CM

## 2019-10-20 DIAGNOSIS — O09892 Supervision of other high risk pregnancies, second trimester: Secondary | ICD-10-CM

## 2019-10-20 LAB — POCT URINALYSIS DIPSTICK OB
Blood, UA: NEGATIVE
Glucose, UA: NEGATIVE
Ketones, UA: NEGATIVE
Leukocytes, UA: NEGATIVE
Nitrite, UA: NEGATIVE

## 2019-10-20 NOTE — Progress Notes (Signed)
.     HIGH-RISK PREGNANCY VISIT Patient name: Tara Stark MRN [id number]  Date of birth: 01/19/1981 Chief Complaint:   Routine Prenatal Visit (Ultrasound)  History of Present Illness:   Tara Stark is a 39 y.o. G56P2002 female at [redacted]w[redacted]d with an Estimated Date of Delivery: 11/05/19 being seen today for ongoing management of a high-risk pregnancy complicated by chronic hypertension currently on Norvasc 10 mg.  Today she reports no complaints. Contractions: Irregular. Vag. Bleeding: None.  Movement: Present. denies leaking of fluid.  Review of Systems:   Pertinent items are noted in HPI Denies abnormal vaginal discharge w/ itching/odor/irritation, headaches, visual changes, shortness of breath, chest pain, abdominal pain, severe nausea/vomiting, or problems with urination or bowel movements unless otherwise stated above. Pertinent History Reviewed:  Reviewed past medical,surgical, social, obstetrical and family history.  Reviewed problem list, medications and allergies. Physical Assessment:   Vitals:   10/20/19 1033 10/20/19 1035  BP: (!) 142/95 (!) 138/91  Pulse: 91 92  Weight: 192 lb (87.1 kg)   Body mass index is 38.78 kg/m.           Physical Examination:   General appearance: alert, well appearing, and in no distress  Mental status: alert, oriented to person, place, and time  Skin: warm & dry   Extremities: Edema: Trace    Cardiovascular: normal heart rate noted  Respiratory: normal respiratory effort, no distress  Abdomen: gravid, soft, non-tender  Pelvic: Cervical exam deferred         Fetal Status:     Movement: Present    Fetal Surveillance Testing today: BPP 8/8 with normal Dopplers   Chaperone: Diona Fanti    Results for orders placed or performed in visit on 10/20/19 (from the past 24 hour(s))  POC Urinalysis Dipstick OB   Collection Time: 10/20/19 10:32 AM  Result Value Ref Range   Color, UA     Clarity, UA     Glucose, UA Negative Negative   Bilirubin, UA      Ketones, UA n    Spec Grav, UA     Blood, UA n    pH, UA     POC,PROTEIN,UA Small (1+) Negative, Trace, Small (1+), Moderate (2+), Large (3+), 4+   Urobilinogen, UA     Nitrite, UA n    Leukocytes, UA Negative Negative   Appearance     Odor      Assessment & Plan:  1) High-risk pregnancy G3P2002 at [redacted]w[redacted]d with an Estimated Date of Delivery: 11/05/19   2) CHTN, stable, Norvasc 10 mg daiy + ASA81, to be induced in 2 days  3) desires BTL, papers signed  Meds: No orders of the defined types were placed in this encounter.   Labs/procedures today: sonogram   Treatment Plan:  IOL 3/28  Reviewed:  labor symptoms and general obstetric precautions including but not limited to vaginal bleeding, contractions, leaking of fluid and fetal movement were reviewed in detail with the patient.  All questions were answered.  home bp cuff. Rx faxed to  Check bp weekly, let us know if >140/90.   Follow-up: Return in about 11 days (around 10/31/2019) for BP check nurse only video visit.  Orders Placed This Encounter  Procedures  . POC Urinalysis Dipstick OB   Jalessa Peyser H Roizy Harold  10/20/2019 11:00 AM

## 2019-10-20 NOTE — Progress Notes (Signed)
[place] [id number] wks,cephalic,fhr 123456 bpm,RI .[id number] 8/8,posterior placenta gr 2,afi 18 cm

## 2019-10-21 LAB — SARS CORONAVIRUS 2 (TAT 6-24 HRS): SARS Coronavirus 2: NEGATIVE

## 2019-10-22 ENCOUNTER — Inpatient Hospital Stay (HOSPITAL_COMMUNITY): Payer: Medicaid Other

## 2019-10-22 ENCOUNTER — Inpatient Hospital Stay (HOSPITAL_COMMUNITY)
Admission: AD | Admit: 2019-10-22 | Discharge: 2019-10-26 | DRG: 784 | Disposition: A | Discharge status: Home / Self Care | Payer: Medicaid Other | Attending: Obstetrics and Gynecology | Admitting: Obstetrics and Gynecology

## 2019-10-22 ENCOUNTER — Inpatient Hospital Stay (HOSPITAL_COMMUNITY): Payer: Medicaid Other | Admitting: Anesthesiology

## 2019-10-22 ENCOUNTER — Other Ambulatory Visit: Payer: Self-pay

## 2019-10-22 ENCOUNTER — Encounter (HOSPITAL_COMMUNITY): Payer: Self-pay | Admitting: Obstetrics & Gynecology

## 2019-10-22 DIAGNOSIS — O1410 Severe pre-eclampsia, unspecified trimester: Secondary | ICD-10-CM | POA: Diagnosis present

## 2019-10-22 DIAGNOSIS — O9882 Other maternal infectious and parasitic diseases complicating childbirth: Secondary | ICD-10-CM | POA: Diagnosis present

## 2019-10-22 DIAGNOSIS — O4593 Premature separation of placenta, unspecified, third trimester: Secondary | ICD-10-CM | POA: Diagnosis not present

## 2019-10-22 DIAGNOSIS — O09892 Supervision of other high risk pregnancies, second trimester: Secondary | ICD-10-CM

## 2019-10-22 DIAGNOSIS — Z3A38 38 weeks gestation of pregnancy: Secondary | ICD-10-CM

## 2019-10-22 DIAGNOSIS — O324XX0 Maternal care for high head at term, not applicable or unspecified: Secondary | ICD-10-CM | POA: Diagnosis present

## 2019-10-22 DIAGNOSIS — O0990 Supervision of high risk pregnancy, unspecified, unspecified trimester: Secondary | ICD-10-CM

## 2019-10-22 DIAGNOSIS — D251 Intramural leiomyoma of uterus: Secondary | ICD-10-CM | POA: Diagnosis present

## 2019-10-22 DIAGNOSIS — D649 Anemia, unspecified: Secondary | ICD-10-CM | POA: Diagnosis present

## 2019-10-22 DIAGNOSIS — O99824 Streptococcus B carrier state complicating childbirth: Secondary | ICD-10-CM | POA: Diagnosis present

## 2019-10-22 DIAGNOSIS — O10919 Unspecified pre-existing hypertension complicating pregnancy, unspecified trimester: Secondary | ICD-10-CM | POA: Insufficient documentation

## 2019-10-22 DIAGNOSIS — Z79899 Other long term (current) drug therapy: Secondary | ICD-10-CM | POA: Diagnosis not present

## 2019-10-22 DIAGNOSIS — I10 Essential (primary) hypertension: Secondary | ICD-10-CM | POA: Diagnosis present

## 2019-10-22 DIAGNOSIS — O1092 Unspecified pre-existing hypertension complicating childbirth: Secondary | ICD-10-CM | POA: Diagnosis present

## 2019-10-22 DIAGNOSIS — Z87891 Personal history of nicotine dependence: Secondary | ICD-10-CM

## 2019-10-22 DIAGNOSIS — Z302 Encounter for sterilization: Secondary | ICD-10-CM | POA: Diagnosis not present

## 2019-10-22 DIAGNOSIS — O9902 Anemia complicating childbirth: Secondary | ICD-10-CM | POA: Diagnosis present

## 2019-10-22 DIAGNOSIS — O1002 Pre-existing essential hypertension complicating childbirth: Secondary | ICD-10-CM | POA: Diagnosis present

## 2019-10-22 DIAGNOSIS — O114 Pre-existing hypertension with pre-eclampsia, complicating childbirth: Principal | ICD-10-CM | POA: Diagnosis present

## 2019-10-22 DIAGNOSIS — O3413 Maternal care for benign tumor of corpus uteri, third trimester: Secondary | ICD-10-CM | POA: Diagnosis present

## 2019-10-22 DIAGNOSIS — B373 Candidiasis of vulva and vagina: Secondary | ICD-10-CM | POA: Diagnosis present

## 2019-10-22 DIAGNOSIS — O10013 Pre-existing essential hypertension complicating pregnancy, third trimester: Secondary | ICD-10-CM | POA: Diagnosis not present

## 2019-10-22 DIAGNOSIS — D259 Leiomyoma of uterus, unspecified: Secondary | ICD-10-CM | POA: Diagnosis present

## 2019-10-22 DIAGNOSIS — O4590 Premature separation of placenta, unspecified, unspecified trimester: Secondary | ICD-10-CM | POA: Diagnosis present

## 2019-10-22 DIAGNOSIS — Z98891 History of uterine scar from previous surgery: Secondary | ICD-10-CM

## 2019-10-22 DIAGNOSIS — N838 Other noninflammatory disorders of ovary, fallopian tube and broad ligament: Secondary | ICD-10-CM | POA: Diagnosis not present

## 2019-10-22 LAB — COMPREHENSIVE METABOLIC PANEL
ALT: 18 U/L (ref 0–44)
AST: 25 U/L (ref 15–41)
Albumin: 2.7 g/dL — ABNORMAL LOW (ref 3.5–5.0)
Alkaline Phosphatase: 184 U/L — ABNORMAL HIGH (ref 38–126)
Anion gap: 11 (ref 5–15)
BUN: 5 mg/dL — ABNORMAL LOW (ref 6–20)
CO2: 22 mmol/L (ref 22–32)
Calcium: 8.6 mg/dL — ABNORMAL LOW (ref 8.9–10.3)
Chloride: 103 mmol/L (ref 98–111)
Creatinine, Ser: 0.4 mg/dL — ABNORMAL LOW (ref 0.44–1.00)
GFR calc Af Amer: >60 mL/min (ref >60)
GFR calc non Af Amer: >60 mL/min (ref >60)
Glucose, Bld: 87 mg/dL (ref 70–99)
Potassium: 4.1 mmol/L (ref 3.5–5.1)
Sodium: 136 mmol/L (ref 135–145)
Total Bilirubin: 0.6 mg/dL (ref 0.3–1.2)
Total Protein: 5.7 g/dL — ABNORMAL LOW (ref 6.5–8.1)

## 2019-10-22 LAB — CBC
HCT: 36 % (ref 36.0–46.0)
Hemoglobin: 11.7 g/dL — ABNORMAL LOW (ref 12.0–15.0)
MCH: 26.8 pg (ref 26.0–34.0)
MCHC: 32.5 g/dL (ref 30.0–36.0)
MCV: 82.4 fL (ref 80.0–100.0)
Platelets: 220 10*3/uL (ref 150–400)
RBC: 4.37 MIL/uL (ref 3.87–5.11)
RDW: 15.3 % (ref 11.5–15.5)
WBC: 10.9 10*3/uL — ABNORMAL HIGH (ref 4.0–10.5)
nRBC: 0 % (ref 0.0–0.2)

## 2019-10-22 LAB — TYPE AND SCREEN
ABO/RH(D): A POS
Antibody Screen: NEGATIVE

## 2019-10-22 LAB — PROTEIN / CREATININE RATIO, URINE
Creatinine, Urine: 220.9 mg/dL
Protein Creatinine Ratio: 0.38 mg/mg{Cre} — ABNORMAL HIGH (ref 0.00–0.15)
Total Protein, Urine: 85 mg/dL

## 2019-10-22 LAB — GLUCOSE, CAPILLARY: Glucose-Capillary: 92 mg/dL (ref 70–99)

## 2019-10-22 LAB — RPR: RPR Ser Ql: NONREACTIVE

## 2019-10-22 MED ORDER — SODIUM CHLORIDE 0.9 % IV SOLN
5.0000 10*6.[IU] | Freq: Once | INTRAVENOUS | Status: AC
  Administered 2019-10-22: 5 10*6.[IU] via INTRAVENOUS
  Filled 2019-10-22: qty 5

## 2019-10-22 MED ORDER — EPHEDRINE 5 MG/ML INJ: 10.0000 mg | INTRAVENOUS | Status: DC | PRN

## 2019-10-22 MED ORDER — PHENYLEPHRINE 40 MCG/ML (10ML) SYRINGE FOR IV PUSH (FOR BLOOD PRESSURE SUPPORT)
80.0000 ug | PREFILLED_SYRINGE | INTRAVENOUS | Status: DC | PRN
  Filled 2019-10-22: qty 10

## 2019-10-22 MED ORDER — LACTATED RINGERS IV SOLN: 500.0000 mL | INTRAVENOUS | Status: DC | PRN

## 2019-10-22 MED ORDER — OXYTOCIN 40 UNITS IN NORMAL SALINE INFUSION - SIMPLE MED: 2.5000 [IU]/h | INTRAVENOUS | Status: DC

## 2019-10-22 MED ORDER — HYDROXYZINE HCL 50 MG PO TABS: 50.0000 mg | ORAL_TABLET | Freq: Four times a day (QID) | ORAL | Status: DC | PRN

## 2019-10-22 MED ORDER — ACETAMINOPHEN 325 MG PO TABS: 650.0000 mg | ORAL_TABLET | ORAL | Status: DC | PRN

## 2019-10-22 MED ORDER — OXYTOCIN 40 UNITS IN NORMAL SALINE INFUSION - SIMPLE MED
1.0000 m[IU]/min | INTRAVENOUS | Status: DC
  Administered 2019-10-22 – 2019-10-23 (×2): 2 m[IU]/min via INTRAVENOUS

## 2019-10-22 MED ORDER — OXYCODONE-ACETAMINOPHEN 5-325 MG PO TABS: 2.0000 | ORAL_TABLET | ORAL | Status: DC | PRN

## 2019-10-22 MED ORDER — FENTANYL-BUPIVACAINE-NACL 0.5-0.125-0.9 MG/250ML-% EP SOLN
12.0000 mL/h | EPIDURAL | Status: DC | PRN
  Filled 2019-10-22: qty 250

## 2019-10-22 MED ORDER — ONDANSETRON HCL 4 MG/2ML IJ SOLN
4.0000 mg | Freq: Four times a day (QID) | INTRAMUSCULAR | Status: DC | PRN
  Administered 2019-10-22 – 2019-10-23 (×2): 4 mg via INTRAVENOUS
  Filled 2019-10-22 (×2): qty 2

## 2019-10-22 MED ORDER — LIDOCAINE HCL (PF) 1 % IJ SOLN: 30.0000 mL | INTRAMUSCULAR | Status: DC | PRN

## 2019-10-22 MED ORDER — TERBUTALINE SULFATE 1 MG/ML IJ SOLN: 0.2500 mg | Freq: Once | INTRAMUSCULAR | Status: DC | PRN

## 2019-10-22 MED ORDER — SOD CITRATE-CITRIC ACID 500-334 MG/5ML PO SOLN: 30.0000 mL | ORAL | Status: DC | PRN

## 2019-10-22 MED ORDER — HYDRALAZINE HCL 20 MG/ML IJ SOLN: 10.0000 mg | INTRAMUSCULAR | Status: DC | PRN

## 2019-10-22 MED ORDER — FLEET ENEMA 7-19 GM/118ML RE ENEM: 1.0000 | ENEMA | RECTAL | Status: DC | PRN

## 2019-10-22 MED ORDER — LACTATED RINGERS IV SOLN
500.0000 mL | Freq: Once | INTRAVENOUS | Status: AC
  Administered 2019-10-22: 13:00:00 500 mL via INTRAVENOUS

## 2019-10-22 MED ORDER — DIPHENHYDRAMINE HCL 50 MG/ML IJ SOLN: 12.5000 mg | INTRAMUSCULAR | Status: DC | PRN

## 2019-10-22 MED ORDER — PHENYLEPHRINE 40 MCG/ML (10ML) SYRINGE FOR IV PUSH (FOR BLOOD PRESSURE SUPPORT): 80.0000 ug | PREFILLED_SYRINGE | INTRAVENOUS | Status: DC | PRN

## 2019-10-22 MED ORDER — OXYTOCIN BOLUS FROM INFUSION: 500.0000 mL | Freq: Once | INTRAVENOUS | Status: DC

## 2019-10-22 MED ORDER — FENTANYL CITRATE (PF) 100 MCG/2ML IJ SOLN
100.0000 ug | INTRAMUSCULAR | Status: DC | PRN
  Administered 2019-10-23: 100 ug via INTRAVENOUS

## 2019-10-22 MED ORDER — LABETALOL HCL 5 MG/ML IV SOLN: 80.0000 mg | INTRAVENOUS | Status: DC | PRN

## 2019-10-22 MED ORDER — LABETALOL HCL 5 MG/ML IV SOLN: 40.0000 mg | INTRAVENOUS | Status: DC | PRN

## 2019-10-22 MED ORDER — AMLODIPINE BESYLATE 10 MG PO TABS
10.0000 mg | ORAL_TABLET | Freq: Every day | ORAL | Status: DC
  Administered 2019-10-22 – 2019-10-26 (×4): 10 mg via ORAL
  Filled 2019-10-22 (×6): qty 1

## 2019-10-22 MED ORDER — LIDOCAINE HCL (PF) 1 % IJ SOLN
INTRAMUSCULAR | Status: DC | PRN
  Administered 2019-10-22: 5 mL via EPIDURAL

## 2019-10-22 MED ORDER — SODIUM CHLORIDE (PF) 0.9 % IJ SOLN
INTRAMUSCULAR | Status: DC | PRN
  Administered 2019-10-22: 12 mL/h via EPIDURAL

## 2019-10-22 MED ORDER — FLUCONAZOLE 150 MG PO TABS
150.0000 mg | ORAL_TABLET | Freq: Once | ORAL | Status: DC
  Filled 2019-10-22: qty 1

## 2019-10-22 MED ORDER — OXYTOCIN 40 UNITS IN NORMAL SALINE INFUSION - SIMPLE MED
INTRAVENOUS | Status: AC
  Filled 2019-10-22: qty 1000

## 2019-10-22 MED ORDER — LACTATED RINGERS IV SOLN: INTRAVENOUS | Status: DC

## 2019-10-22 MED ORDER — OXYCODONE-ACETAMINOPHEN 5-325 MG PO TABS: 1.0000 | ORAL_TABLET | ORAL | Status: DC | PRN

## 2019-10-22 MED ORDER — LABETALOL HCL 5 MG/ML IV SOLN
20.0000 mg | INTRAVENOUS | Status: DC | PRN
  Administered 2019-10-23: 20 mg via INTRAVENOUS
  Filled 2019-10-22: qty 4

## 2019-10-22 MED ORDER — PENICILLIN G POT IN DEXTROSE 60000 UNIT/ML IV SOLN
3.0000 10*6.[IU] | INTRAVENOUS | Status: DC
  Administered 2019-10-22 – 2019-10-23 (×4): 3 10*6.[IU] via INTRAVENOUS
  Filled 2019-10-22 (×4): qty 50

## 2019-10-22 NOTE — Progress Notes (Addendum)
Tara Stark is a 39 y.o. G3P2002 at [redacted]w[redacted]d admitted for IOL 2/2 cHTN, now with superimposed Pre-E (w/o severe features).  Subjective: Comfortable with epidural. Feeling mild pressure with ctx.   Objective: BP (!) 153/92   Pulse 86   Temp 97.8 F (36.6 C) (Oral)   Resp 18   Ht 4\' 11"  (1.499 m)   Wt 87 kg   LMP 01/14/2019   SpO2 97%   BMI 38.72 kg/m  No intake/output data recorded.  FHT:  FHR: 120-125 bpm, variability: moderate,  accelerations:  Present,  decelerations:  Absent UC:  q31m  SVE:   Dilation: 6 Effacement (%): 90 Station: 0 Exam by:: Alvira Philips, RN  Labs: Lab Results  Component Value Date   WBC 10.9 (H) 10/22/2019   HGB 11.7 (L) 10/22/2019   HCT 36.0 10/22/2019   MCV 82.4 10/22/2019   PLT 220 10/22/2019    Assessment / Plan: 39 y.o. G3P2002 [redacted]w[redacted]d for IOL for cHTN.  #Labor: Progressing well. MVU's adequate without Pit; restart PRN. S/p AROM. Anticipate SVD. Report of some vaginal bleeding throughout the day. Only mucous-like bloody show currently. FHR reassuring. Continue to monitor.  #Pain: Epidural #FWB: Cat I #GBS positive - PCN #cHTN with superimposed Pre-E (w/o severe features): Continue Norvasc 10mg /day. No severe range pressures or severe features.  #Vaginal yeast- Diflucan 150 mg ordered post-partum   Chauncey Mann, MD OB Fellow, [facility] 10/22/2019, 8:18 PM

## 2019-10-22 NOTE — Anesthesia Procedure Notes (Signed)
Epidural Patient location during procedure: OB Start time: 10/22/2019 12:52 PM End time: 10/22/2019 1:07 PM  Staffing Anesthesiologist: Barnet Glasgow, MD Performed: anesthesiologist   Preanesthetic Checklist Completed: patient identified, IV checked, site marked, risks and benefits discussed, surgical consent, monitors and equipment checked, pre-op evaluation and timeout performed  Epidural Patient position: sitting Prep: DuraPrep and site prepped and draped Patient monitoring: continuous pulse ox and blood pressure Approach: midline Location: L3-L4 Injection technique: LOR air  Needle:  Needle type: Tuohy  Needle gauge: 17 G Needle length: 9 cm and 9 Needle insertion depth: 8 cm Catheter type: closed end flexible Catheter size: 19 Gauge Catheter at skin depth: 14 cm Test dose: negative  Assessment Events: blood not aspirated, injection not painful, no injection resistance, no paresthesia and negative IV test  Additional Notes Patient identified. Risks/Benefits/Options discussed with patient including but not limited to bleeding, infection, nerve damage, paralysis, failed block, incomplete pain control, headache, blood pressure changes, nausea, vomiting, reactions to medication both or allergic, itching and postpartum back pain. Confirmed with bedside nurse the patient's most recent platelet count. Confirmed with patient that they are not currently taking any anticoagulation, have any bleeding history or any family history of bleeding disorders. Patient expressed understanding and wished to proceed. All questions were answered. Sterile technique was used throughout the entire procedure. Please see nursing notes for vital signs. Test dose was given through epidural needle and negative prior to continuing to dose epidural or start infusion. Warning signs of high block given to the patient including shortness of breath, tingling/numbness in hands, complete motor block, or any  concerning symptoms with instructions to call for help. Patient was given instructions on fall risk and not to get out of bed. All questions and concerns addressed with instructions to call with any issues. 2 Attempt (S) . Patient tolerated procedure well.

## 2019-10-22 NOTE — Anesthesia Preprocedure Evaluation (Addendum)
Anesthesia Evaluation  Patient identified by MRN, date of birth, ID band Patient awake    Reviewed: Allergy & Precautions, NPO status , Patient's Chart, lab work & pertinent test results  History of Anesthesia Complications Negative for: history of anesthetic complications  Airway Mallampati: II  TM Distance: >3 FB Neck ROM: Full    Dental  (+) Teeth Intact, Dental Advisory Given   Pulmonary neg recent URI, former smoker,    breath sounds clear to auscultation       Cardiovascular hypertension, Pt. on medications  Rhythm:Regular Rate:Normal     Neuro/Psych negative neurological ROS  negative psych ROS   GI/Hepatic negative GI ROS, Neg liver ROS,   Endo/Other  negative endocrine ROS  Renal/GU negative Renal ROS     Musculoskeletal negative musculoskeletal ROS (+)   Abdominal (+) + obese,   Peds  Hematology  (+) anemia , hgb 11.7 plt 220   Anesthesia Other Findings C section called, fetal intolerance and possible sbruption  Reproductive/Obstetrics (+) Pregnancy                           Anesthesia Physical Anesthesia Plan  ASA: III  Anesthesia Plan: Epidural   Post-op Pain Management:    Induction:   PONV Risk Score and Plan: 2 and Ondansetron and Treatment may vary due to age or medical condition  Airway Management Planned:   Additional Equipment: Fetal Monitoring  Intra-op Plan:   Post-operative Plan:   Informed Consent: I have reviewed the patients History and Physical, chart, labs and discussed the procedure including the risks, benefits and alternatives for the proposed anesthesia with the patient or authorized representative who has indicated his/her understanding and acceptance.     Dental advisory given  Plan Discussed with: CRNA and Surgeon  Anesthesia Plan Comments: (81 Wk G3p2 for LEA)      Anesthesia Quick Evaluation

## 2019-10-22 NOTE — H&P (Addendum)
OBSTETRIC ADMISSION HISTORY AND PHYSICAL  Tara Stark is a 39 y.o. female G3P2002 with IUP at [redacted]w[redacted]d by 8 week u/s presenting for IOL for cHTN. She reports +FMs, No LOF, no VB, headaches peripheral edema, or RUQ pain.  She states she did have a short bout of blurry vision earlier which has since resolved. She plans on bottle/breast feeding. She request BTL for birth control. She received her prenatal care at [facility]   Dating: By 8 week u/s --->  Estimated Date of Delivery: 11/05/19  Sono:   @[redacted]w[redacted]d , CWD, normal anatomy, cephalic presentation, [id number], 91% EFW   Prenatal History/Complications: cHTN Hx Cervical cerclage Uterine fibroid Hx IUGR in prior preg Short interval between pregnancies  Past Medical History: Past Medical History:  Diagnosis Date  . Acid indigestion   . Fibroid   . Hemorrhoids   . Hypertension   . Thyroid disease   . Vitamin D deficiency disease 04/18/2019    Past Surgical History: Past Surgical History:  Procedure Laterality Date  . NO PAST SURGERIES      Obstetrical History: OB History    Gravida  3   Para  2   Term  2   Preterm  0   AB  0   Living  2     SAB  0   TAB  0   Ectopic  0   Multiple  0   Live Births  2           Social History Social History   Socioeconomic History  . Marital status: Married    Spouse name: Jamarria Jubb  . Number of children: 1  . Years of education: Not on file  . Highest education level: Associate degree: occupational, Hotel manager, or vocational program  Occupational History  . Not on file  Tobacco Use  . Smoking status: Former Smoker    Years: 4.00    Types: Cigarettes  . Smokeless tobacco: Never Used  . Tobacco comment: 2-3 cigarettes per day  Substance and Sexual Activity  . Alcohol use: No  . Drug use: No  . Sexual activity: Yes    Birth control/protection: None  Other Topics Concern  . Not on file  Social History Narrative   Married since 2002.Lives with husband and 2  kids.Lead teacher Childcare/Daycare.   Social Determinants of Health   Financial Resource Strain:   . Difficulty of Paying Living Expenses:   Food Insecurity:   . Worried About Charity fundraiser in the Last Year:   . Arboriculturist in the Last Year:   Transportation Needs:   . Film/video editor (Medical):   Marland Kitchen Lack of Transportation (Non-Medical):   Physical Activity:   . Days of Exercise per Week:   . Minutes of Exercise per Session:   Stress:   . Feeling of Stress :   Social Connections:   . Frequency of Communication with Friends and Family:   . Frequency of Social Gatherings with Friends and Family:   . Attends Religious Services:   . Active Member of Clubs or Organizations:   . Attends Archivist Meetings:   Marland Kitchen Marital Status:     Family History: Family History  Problem Relation Age of Onset  . Other Paternal Grandfather        MVA  . Breast cancer Paternal Grandmother   . Alzheimer's disease Maternal Grandmother   . Cancer Maternal Grandfather        throat  .  Diabetes Father   . Other Father        has a pacemaker  . Hypertension Mother   . Hypertension Brother   . Hypertension Sister   . Hypertension Sister   . Bronchiolitis Son   . Hypertension Paternal Aunt     Allergies: No Known Allergies  Medications Prior to Admission  Medication Sig Dispense Refill Last Dose  . amLODipine (NORVASC) 10 MG tablet Take 1 tablet (10 mg total) by mouth daily. 30 tablet 4   . aspirin EC 81 MG tablet Take 2 tablets (162 mg total) by mouth daily. 60 tablet 10   . Blood Pressure Monitor MISC For regular home bp monitoring during pregnancy 1 each 0   . butalbital-acetaminophen-caffeine (FIORICET) 50-325-40 MG tablet Take 1-2 tablets by mouth every 6 (six) hours as needed for headache. 10 tablet 0   . cholecalciferol (VITAMIN D3) 25 MCG (1000 UNIT) tablet Take 1,000 Units by mouth daily.     . Prenatal 27-1 MG TABS TAKE ONE TABLET AT NOON. 30 tablet 6       Review of Systems   All systems reviewed and negative except as stated in HPI  Blood pressure (!) 132/91, pulse 93, temperature 98.2 F (36.8 C), temperature source Oral, resp. rate 18, height 4\' 11"  (1.499 m), weight 87 kg, last menstrual period 01/14/2019, currently breastfeeding. General appearance: alert, cooperative and no distress Lungs: clear to auscultation bilaterally Heart: regular rate and rhythm Abdomen: soft, non-tender; bowel sounds normal Pelvic: Deferred Extremities: Homans sign is negative, no sign of DVT Presentation: cephalic Fetal monitoringBaseline: 135 bpm, Variability: Good {> 6 bpm) and Accelerations: Reactive Uterine activity No contractions Dilation: 3.5 Effacement (%): Thick Station: [place], -2 Exam by:: [employer]  Prenatal labs: ABO, Rh: A/Positive/-- (09/01 0000) Antibody: Negative (01/07 0831) Rubella: Immune (09/01 0000) RPR: Non Reactive (01/07 0831)  HBsAg: Negative (09/01 0000)  HIV: Non Reactive (01/07 0831)  GBS: Positive/-- (03/19 1400)  2 hr Glucola 68/142/153* Genetic screening  MaterniT21 normal Anatomy US Normal  Prenatal Transfer Tool  Maternal Diabetes: Borderline 2hr GTT Genetic Screening: Normal Maternal Ultrasounds/Referrals: Normal Fetal Ultrasounds or other Referrals:  None Maternal Substance Abuse:  No Significant Maternal Medications:  None Significant Maternal Lab Results: Group B Strep positive  No results found for this or any previous visit (from the past 24 hour(s)).  Patient Active Problem List   Diagnosis Date Noted  . Chronic hypertension affecting pregnancy 10/22/2019  . NST (non-stress test) nonreactive 10/10/2019  . Supervision of high risk pregnancy, antepartum 05/17/2019  . Short interval between pregnancies affecting pregnancy in second trimester, antepartum 05/17/2019  . Chronic hypertension 05/17/2019  . Vitamin D deficiency disease 04/18/2019  . Uterine fibroid 04/21/2018  . History of  prior pregnancy with IUGR newborn 04/15/2018  . History of cervical cerclage 03/09/2018    Assessment/Plan:  Tara Stark is a 39 y.o. G3P2002 at [redacted]w[redacted]d here for IOL for cHTN.  #Labor: Will start with pitocin and recheck in 4 hours #Pain: Analgesia PRN #FWB: Cat I #ID: GBS pos - PCN #MOF: Both #MOC:BTL #Circ:  Yes, inpatient #cHTN: Home meds include Norvasc 10mg . Continue Norvasc. No severe range pressures, no severe features. Pre-E labs pending. Baseline Pr:C 0.251. #Borderline 2hr GTT: Will check CBG, if elevated will check q4hr/q2hr when active  Lurline Del, DO  10/22/2019, 10:10 AM  GME ATTESTATION:  I saw and evaluated the patient. I agree with the findings and the plan of care as documented in the  resident's note with addition of the following:  Risks and benefits of induction were reviewed, including failure of method, prolonged labor, need for further intervention, risk of cesarean.  Patient and family seem to understand these risks and wish to proceed. Options of AROM and pitocin reviewed, with use of each discussed.  [name], DO OB Fellow, [place] [facility] [place] 10/22/2019 10:50 AM

## 2019-10-22 NOTE — Progress Notes (Addendum)
Tara Stark is a 39 y.o. G3P2002 at [redacted]w[redacted]d admitted for IOL 2/2 cHTN, now with superimposed Pre-E (w/o severe features).  Subjective: Comfortable with epidural.  Objective: BP 139/89   Pulse 87   Temp (!) 97.4 F (36.3 C) (Oral)   Resp 18   Ht 4\' 11"  (1.499 m)   Wt 87 kg   LMP 01/14/2019   SpO2 97%   BMI 38.72 kg/m  Total I/O In: -  Out: 1700 [Urine:1700]  FHT:  FHR: 120-125 bpm, variability: moderate,  accelerations:  Present,  decelerations:  Absent UC:   regular, every 2-3 minutes  SVE:   Dilation: 4.5 Effacement (%): 70 Station: -2 Exam by:: [name]  Pitocin @ 18 mu/min  Labs: Lab Results  Component Value Date   WBC 10.9 (H) 10/22/2019   HGB 11.7 (L) 10/22/2019   HCT 36.0 10/22/2019   MCV 82.4 10/22/2019   PLT 220 10/22/2019    Assessment / Plan: 39 y.o. G3P2002 [redacted]w[redacted]d for IOL for cHTN.  #Labor: Pit at 24. AROM this check with clear fluid. Consider IUPC if no cervical change #Pain: Epidural #FWB: Cat I #GBS positive - PCN #cHTN with superimposed Pre-E (w/o severe features): Continue Norvasc 10mg /day. No severe range pressures or severe features.  #Vaginal yeast- Diflucan 150 mg ordered  [name] DO OB Fellow, Faculty Practice 10/22/2019, 4:36 PM

## 2019-10-22 NOTE — Progress Notes (Signed)
Spoke with Dr. Darene Lamer and Dr. Harolyn Rutherford. Pt has less than 30 seconds resting time in between UC's. Abd feels firm . Moderate amt of bright red bleeding noted. Orders received to d/c pitocin.

## 2019-10-22 NOTE — Progress Notes (Signed)
Labor Progress Note CALVIN DONICA is a 39 y.o. G3P2002 at [redacted]w[redacted]d presented for IOL for cHTN. S:  Was feeling contractions, just got epidural. No complaints currently.  O:  BP 132/82   Pulse 75   Temp 98.2 F (36.8 C) (Oral)   Resp 18   Ht 4\' 11"  (1.499 m)   Wt 87 kg   LMP 01/14/2019   SpO2 99%   BMI 38.72 kg/m  EFM: 125/moderate/acels present  CVE: Dilation: 3.5 Effacement (%): 50 Cervical Position: Anterior Station: -2 Presentation: Vertex Exam by:: Varney Baas, RN   A&P: 39 y.o. [id number] [redacted]w[redacted]d for IOL for cHTN. #Labor: Continuing on pitocin. Will consider AROM at next check. #Pain: Epidural #FWB: Cat I #GBS positive - PCN #cHTN: Continue Norvasc 10mg /day. No severe range pressures or severe features.   Lurline Del, DO 2:03 PM

## 2019-10-22 NOTE — Progress Notes (Addendum)
IUPC placed for better contraction monitoring and pitocin titration.  FHR: 130, mod var, accels, no decels CTX q 2 minutes  Pit off 2/2 tachysystole No severe range pressures  Anticipate vaginal delivery  [name], DO OB Fellow, Faculty Practice 10/22/2019 6:37 PM

## 2019-10-23 ENCOUNTER — Other Ambulatory Visit: Payer: Medicaid Other

## 2019-10-23 ENCOUNTER — Encounter (HOSPITAL_COMMUNITY): Payer: Self-pay | Admitting: Obstetrics & Gynecology

## 2019-10-23 ENCOUNTER — Encounter (HOSPITAL_COMMUNITY)
Admission: AD | Disposition: A | Discharge status: Home / Self Care | Payer: Self-pay | Source: Home / Self Care | Attending: Obstetrics & Gynecology

## 2019-10-23 DIAGNOSIS — Z302 Encounter for sterilization: Secondary | ICD-10-CM

## 2019-10-23 DIAGNOSIS — O10013 Pre-existing essential hypertension complicating pregnancy, third trimester: Secondary | ICD-10-CM

## 2019-10-23 DIAGNOSIS — O1410 Severe pre-eclampsia, unspecified trimester: Secondary | ICD-10-CM | POA: Diagnosis present

## 2019-10-23 DIAGNOSIS — O4590 Premature separation of placenta, unspecified, unspecified trimester: Secondary | ICD-10-CM | POA: Diagnosis present

## 2019-10-23 DIAGNOSIS — Z3A38 38 weeks gestation of pregnancy: Secondary | ICD-10-CM

## 2019-10-23 DIAGNOSIS — Z98891 History of uterine scar from previous surgery: Secondary | ICD-10-CM

## 2019-10-23 LAB — COMPREHENSIVE METABOLIC PANEL
ALT: 17 U/L (ref 0–44)
AST: 28 U/L (ref 15–41)
Albumin: 2.6 g/dL — ABNORMAL LOW (ref 3.5–5.0)
Alkaline Phosphatase: 165 U/L — ABNORMAL HIGH (ref 38–126)
Anion gap: 13 (ref 5–15)
BUN: 5 mg/dL — ABNORMAL LOW (ref 6–20)
CO2: 21 mmol/L — ABNORMAL LOW (ref 22–32)
Calcium: 8.2 mg/dL — ABNORMAL LOW (ref 8.9–10.3)
Chloride: 103 mmol/L (ref 98–111)
Creatinine, Ser: 0.86 mg/dL (ref 0.44–1.00)
GFR calc Af Amer: >60 mL/min (ref >60)
GFR calc non Af Amer: >60 mL/min (ref >60)
Glucose, Bld: 123 mg/dL — ABNORMAL HIGH (ref 70–99)
Potassium: 4.2 mmol/L (ref 3.5–5.1)
Sodium: 137 mmol/L (ref 135–145)
Total Bilirubin: 1.1 mg/dL (ref 0.3–1.2)
Total Protein: 5.8 g/dL — ABNORMAL LOW (ref 6.5–8.1)

## 2019-10-23 LAB — CBC
HCT: 34.9 % — ABNORMAL LOW (ref 36.0–46.0)
Hemoglobin: 11.1 g/dL — ABNORMAL LOW (ref 12.0–15.0)
MCH: 26.9 pg (ref 26.0–34.0)
MCHC: 31.8 g/dL (ref 30.0–36.0)
MCV: 84.7 fL (ref 80.0–100.0)
Platelets: 196 10*3/uL (ref 150–400)
RBC: 4.12 MIL/uL (ref 3.87–5.11)
RDW: 15.4 % (ref 11.5–15.5)
WBC: 26 10*3/uL — ABNORMAL HIGH (ref 4.0–10.5)
nRBC: 0 % (ref 0.0–0.2)

## 2019-10-23 LAB — MAGNESIUM: Magnesium: 4.3 mg/dL — ABNORMAL HIGH (ref 1.7–2.4)

## 2019-10-23 SURGERY — Surgical Case
Anesthesia: Epidural | Wound class: Clean Contaminated

## 2019-10-23 MED ORDER — MENTHOL 3 MG MT LOZG: 1.0000 | LOZENGE | OROMUCOSAL | Status: DC | PRN

## 2019-10-23 MED ORDER — GABAPENTIN 300 MG PO CAPS
300.0000 mg | ORAL_CAPSULE | Freq: Two times a day (BID) | ORAL | Status: DC
  Administered 2019-10-23 – 2019-10-26 (×7): 300 mg via ORAL
  Filled 2019-10-23 (×7): qty 1

## 2019-10-23 MED ORDER — OXYTOCIN 40 UNITS IN NORMAL SALINE INFUSION - SIMPLE MED: 2.5000 [IU]/h | INTRAVENOUS | Status: AC

## 2019-10-23 MED ORDER — LACTATED RINGERS IV SOLN: INTRAVENOUS | Status: DC | PRN

## 2019-10-23 MED ORDER — SCOPOLAMINE 1 MG/3DAYS TD PT72
MEDICATED_PATCH | TRANSDERMAL | Status: DC | PRN
  Administered 2019-10-23: 1 via TRANSDERMAL

## 2019-10-23 MED ORDER — ACETAMINOPHEN 500 MG PO TABS: 1000.0000 mg | ORAL_TABLET | Freq: Once | ORAL | Status: DC | PRN

## 2019-10-23 MED ORDER — FERROUS SULFATE 325 (65 FE) MG PO TABS
325.0000 mg | ORAL_TABLET | Freq: Two times a day (BID) | ORAL | Status: DC
  Administered 2019-10-23 – 2019-10-26 (×7): 325 mg via ORAL
  Filled 2019-10-23 (×7): qty 1

## 2019-10-23 MED ORDER — MAGNESIUM SULFATE 40 GM/1000ML IV SOLN
2.0000 g/h | INTRAVENOUS | Status: DC
  Administered 2019-10-23 – 2019-10-24 (×2): 2 g/h via INTRAVENOUS
  Filled 2019-10-23: qty 1000

## 2019-10-23 MED ORDER — KETOROLAC TROMETHAMINE 30 MG/ML IJ SOLN
30.0000 mg | Freq: Four times a day (QID) | INTRAMUSCULAR | Status: AC
  Administered 2019-10-23 – 2019-10-24 (×3): 30 mg via INTRAVENOUS
  Filled 2019-10-23 (×3): qty 1

## 2019-10-23 MED ORDER — WITCH HAZEL-GLYCERIN EX PADS: 1.0000 "application " | MEDICATED_PAD | CUTANEOUS | Status: DC | PRN

## 2019-10-23 MED ORDER — ZOLPIDEM TARTRATE 5 MG PO TABS: 5.0000 mg | ORAL_TABLET | Freq: Every evening | ORAL | Status: DC | PRN

## 2019-10-23 MED ORDER — TRAMADOL HCL 50 MG PO TABS: 50.0000 mg | ORAL_TABLET | Freq: Four times a day (QID) | ORAL | Status: DC | PRN

## 2019-10-23 MED ORDER — MORPHINE SULFATE (PF) 0.5 MG/ML IJ SOLN
INTRAMUSCULAR | Status: AC
  Filled 2019-10-23: qty 10

## 2019-10-23 MED ORDER — COCONUT OIL OIL: 1.0000 "application " | TOPICAL_OIL | Status: DC | PRN

## 2019-10-23 MED ORDER — OXYTOCIN 40 UNITS IN NORMAL SALINE INFUSION - SIMPLE MED
INTRAVENOUS | Status: AC
  Filled 2019-10-23: qty 1000

## 2019-10-23 MED ORDER — MAGNESIUM SULFATE 40 GM/1000ML IV SOLN
2.0000 g/h | INTRAVENOUS | Status: DC
  Administered 2019-10-23: 2 g/h via INTRAVENOUS
  Filled 2019-10-23: qty 1000

## 2019-10-23 MED ORDER — SENNOSIDES-DOCUSATE SODIUM 8.6-50 MG PO TABS
2.0000 | ORAL_TABLET | ORAL | Status: DC
  Administered 2019-10-24 – 2019-10-26 (×3): 2 via ORAL
  Filled 2019-10-23 (×3): qty 2

## 2019-10-23 MED ORDER — OXYCODONE-ACETAMINOPHEN 5-325 MG PO TABS: 2.0000 | ORAL_TABLET | ORAL | Status: DC | PRN

## 2019-10-23 MED ORDER — ENOXAPARIN SODIUM 40 MG/0.4ML ~~LOC~~ SOLN
40.0000 mg | SUBCUTANEOUS | Status: DC
  Administered 2019-10-24 – 2019-10-26 (×3): 40 mg via SUBCUTANEOUS
  Filled 2019-10-23 (×3): qty 0.4

## 2019-10-23 MED ORDER — DEXAMETHASONE SODIUM PHOSPHATE 4 MG/ML IJ SOLN
INTRAMUSCULAR | Status: DC | PRN
  Administered 2019-10-23: 4 mg via INTRAVENOUS

## 2019-10-23 MED ORDER — PROMETHAZINE HCL 25 MG/ML IJ SOLN: 6.2500 mg | INTRAMUSCULAR | Status: DC | PRN

## 2019-10-23 MED ORDER — MEASLES, MUMPS & RUBELLA VAC IJ SOLR: 0.5000 mL | Freq: Once | INTRAMUSCULAR | Status: DC

## 2019-10-23 MED ORDER — OXYCODONE HCL 5 MG PO TABS: 5.0000 mg | ORAL_TABLET | Freq: Once | ORAL | Status: DC | PRN

## 2019-10-23 MED ORDER — METOCLOPRAMIDE HCL 5 MG/ML IJ SOLN
INTRAMUSCULAR | Status: AC
  Filled 2019-10-23: qty 2

## 2019-10-23 MED ORDER — LACTATED RINGERS IV SOLN: INTRAVENOUS | Status: DC

## 2019-10-23 MED ORDER — ONDANSETRON HCL 4 MG/2ML IJ SOLN
INTRAMUSCULAR | Status: DC | PRN
  Administered 2019-10-23: 4 mg via INTRAVENOUS

## 2019-10-23 MED ORDER — HYDRALAZINE HCL 20 MG/ML IJ SOLN: 5.0000 mg | INTRAMUSCULAR | Status: DC | PRN

## 2019-10-23 MED ORDER — LABETALOL HCL 5 MG/ML IV SOLN: 20.0000 mg | INTRAVENOUS | Status: DC | PRN

## 2019-10-23 MED ORDER — DEXAMETHASONE SODIUM PHOSPHATE 4 MG/ML IJ SOLN
INTRAMUSCULAR | Status: AC
  Filled 2019-10-23: qty 1

## 2019-10-23 MED ORDER — ACETAMINOPHEN 160 MG/5ML PO SOLN: 1000.0000 mg | Freq: Once | ORAL | Status: DC | PRN

## 2019-10-23 MED ORDER — TETANUS-DIPHTH-ACELL PERTUSSIS 5-2.5-18.5 LF-MCG/0.5 IM SUSP: 0.5000 mL | Freq: Once | INTRAMUSCULAR | Status: DC

## 2019-10-23 MED ORDER — SIMETHICONE 80 MG PO CHEW
80.0000 mg | CHEWABLE_TABLET | ORAL | Status: DC
  Administered 2019-10-24 – 2019-10-26 (×3): 80 mg via ORAL
  Filled 2019-10-23 (×3): qty 1

## 2019-10-23 MED ORDER — LABETALOL HCL 5 MG/ML IV SOLN: 40.0000 mg | INTRAVENOUS | Status: DC | PRN

## 2019-10-23 MED ORDER — ONDANSETRON HCL 4 MG/2ML IJ SOLN
4.0000 mg | Freq: Four times a day (QID) | INTRAMUSCULAR | Status: DC | PRN
  Administered 2019-10-23: 4 mg via INTRAVENOUS
  Filled 2019-10-23: qty 2

## 2019-10-23 MED ORDER — DIPHENHYDRAMINE HCL 50 MG/ML IJ SOLN
INTRAMUSCULAR | Status: AC
  Filled 2019-10-23: qty 1

## 2019-10-23 MED ORDER — PRENATAL MULTIVITAMIN CH
1.0000 | ORAL_TABLET | Freq: Every day | ORAL | Status: DC
  Administered 2019-10-23 – 2019-10-26 (×4): 1 via ORAL
  Filled 2019-10-23 (×4): qty 1

## 2019-10-23 MED ORDER — MORPHINE SULFATE (PF) 0.5 MG/ML IJ SOLN
INTRAMUSCULAR | Status: DC | PRN
  Administered 2019-10-23: 3 mg via EPIDURAL

## 2019-10-23 MED ORDER — HYDROMORPHONE HCL 1 MG/ML IJ SOLN: 1.0000 mg | INTRAMUSCULAR | Status: DC | PRN

## 2019-10-23 MED ORDER — SODIUM CHLORIDE 0.9 % IV SOLN: INTRAVENOUS | Status: DC | PRN

## 2019-10-23 MED ORDER — FENTANYL CITRATE (PF) 100 MCG/2ML IJ SOLN
INTRAMUSCULAR | Status: AC
  Filled 2019-10-23: qty 2

## 2019-10-23 MED ORDER — SIMETHICONE 80 MG PO CHEW: 80.0000 mg | CHEWABLE_TABLET | ORAL | Status: DC | PRN

## 2019-10-23 MED ORDER — DIPHENHYDRAMINE HCL 25 MG PO CAPS: 25.0000 mg | ORAL_CAPSULE | Freq: Four times a day (QID) | ORAL | Status: DC | PRN

## 2019-10-23 MED ORDER — OXYCODONE HCL 5 MG/5ML PO SOLN: 5.0000 mg | Freq: Once | ORAL | Status: DC | PRN

## 2019-10-23 MED ORDER — PHENYLEPHRINE HCL (PRESSORS) 10 MG/ML IV SOLN
INTRAVENOUS | Status: DC | PRN
  Administered 2019-10-23 (×7): 80 ug via INTRAVENOUS

## 2019-10-23 MED ORDER — KETOROLAC TROMETHAMINE 30 MG/ML IJ SOLN
30.0000 mg | Freq: Once | INTRAMUSCULAR | Status: AC
  Administered 2019-10-23: 30 mg via INTRAVENOUS
  Filled 2019-10-23: qty 1

## 2019-10-23 MED ORDER — CEFAZOLIN SODIUM-DEXTROSE 2-4 GM/100ML-% IV SOLN
2.0000 g | INTRAVENOUS | Status: AC
  Administered 2019-10-23: 2 g via INTRAVENOUS

## 2019-10-23 MED ORDER — HYDRALAZINE HCL 20 MG/ML IJ SOLN: 10.0000 mg | INTRAMUSCULAR | Status: DC | PRN

## 2019-10-23 MED ORDER — FENTANYL CITRATE (PF) 100 MCG/2ML IJ SOLN: 25.0000 ug | INTRAMUSCULAR | Status: DC | PRN

## 2019-10-23 MED ORDER — ACETAMINOPHEN 10 MG/ML IV SOLN: 1000.0000 mg | Freq: Once | INTRAVENOUS | Status: DC | PRN

## 2019-10-23 MED ORDER — SODIUM CHLORIDE 0.9 % IV SOLN
INTRAVENOUS | Status: AC
  Filled 2019-10-23: qty 500

## 2019-10-23 MED ORDER — SODIUM CHLORIDE 0.9 % IV SOLN
500.0000 mg | INTRAVENOUS | Status: AC
  Administered 2019-10-23: 04:00:00 500 mg via INTRAVENOUS
  Filled 2019-10-23: qty 500

## 2019-10-23 MED ORDER — SODIUM BICARBONATE 8.4 % IV SOLN
INTRAVENOUS | Status: DC | PRN
  Administered 2019-10-23: 10 mL via EPIDURAL

## 2019-10-23 MED ORDER — METOCLOPRAMIDE HCL 5 MG/ML IJ SOLN
INTRAMUSCULAR | Status: DC | PRN
  Administered 2019-10-23: 10 mg via INTRAVENOUS

## 2019-10-23 MED ORDER — IBUPROFEN 800 MG PO TABS
800.0000 mg | ORAL_TABLET | Freq: Four times a day (QID) | ORAL | Status: DC
  Administered 2019-10-24 – 2019-10-26 (×9): 800 mg via ORAL
  Filled 2019-10-23 (×9): qty 1

## 2019-10-23 MED ORDER — PHENYLEPHRINE 40 MCG/ML (10ML) SYRINGE FOR IV PUSH (FOR BLOOD PRESSURE SUPPORT)
PREFILLED_SYRINGE | INTRAVENOUS | Status: AC
  Filled 2019-10-23: qty 30

## 2019-10-23 MED ORDER — OXYTOCIN 10 UNIT/ML IJ SOLN
INTRAMUSCULAR | Status: DC | PRN
  Administered 2019-10-23: 40 [IU] via INTRAMUSCULAR

## 2019-10-23 MED ORDER — OXYCODONE HCL 5 MG PO TABS
5.0000 mg | ORAL_TABLET | ORAL | Status: DC | PRN
  Administered 2019-10-24: 10 mg via ORAL
  Administered 2019-10-24: 5 mg via ORAL
  Filled 2019-10-23: qty 1
  Filled 2019-10-23: qty 2

## 2019-10-23 MED ORDER — DIPHENHYDRAMINE HCL 50 MG/ML IJ SOLN
INTRAMUSCULAR | Status: DC | PRN
  Administered 2019-10-23: 25 mg via INTRAVENOUS

## 2019-10-23 MED ORDER — MAGNESIUM HYDROXIDE 400 MG/5ML PO SUSP: 30.0000 mL | ORAL | Status: DC | PRN

## 2019-10-23 MED ORDER — DIBUCAINE (PERIANAL) 1 % EX OINT: 1.0000 "application " | TOPICAL_OINTMENT | CUTANEOUS | Status: DC | PRN

## 2019-10-23 MED ORDER — MAGNESIUM SULFATE BOLUS VIA INFUSION
4.0000 g | Freq: Once | INTRAVENOUS | Status: AC
  Administered 2019-10-23: 4 g via INTRAVENOUS
  Filled 2019-10-23: qty 1000

## 2019-10-23 SURGICAL SUPPLY — 34 items
BENZOIN TINCTURE PRP APPL 2/3 (GAUZE/BANDAGES/DRESSINGS) ×3 IMPLANT
CHLORAPREP W/TINT 26ML (MISCELLANEOUS) ×3 IMPLANT
CLAMP CORD UMBIL (MISCELLANEOUS) IMPLANT
CLOSURE STERI STRIP 1/2 X4 (GAUZE/BANDAGES/DRESSINGS) ×3 IMPLANT
CLOTH BEACON ORANGE TIMEOUT ST (SAFETY) ×3 IMPLANT
DRSG OPSITE POSTOP 4X10 (GAUZE/BANDAGES/DRESSINGS) ×3 IMPLANT
ELECT REM PT RETURN 9FT ADLT (ELECTROSURGICAL) ×3
ELECTRODE REM PT RTRN 9FT ADLT (ELECTROSURGICAL) ×1 IMPLANT
EXTRACTOR VACUUM M CUP 4 TUBE (SUCTIONS) IMPLANT
EXTRACTOR VACUUM M CUP 4' TUBE (SUCTIONS)
GAUZE SPONGE 4X4 8PLY STR LF (GAUZE/BANDAGES/DRESSINGS) ×6 IMPLANT
GLOVE BIOGEL PI IND STRL 7.0 (GLOVE) ×3 IMPLANT
GLOVE BIOGEL PI INDICATOR 7.0 (GLOVE) ×6
GLOVE ECLIPSE 7.0 STRL STRAW (GLOVE) ×3 IMPLANT
GOWN STRL REUS W/TWL LRG LVL3 (GOWN DISPOSABLE) ×6 IMPLANT
HOVERMATT SINGLE USE (MISCELLANEOUS) ×3 IMPLANT
KIT ABG SYR 3ML LUER SLIP (SYRINGE) IMPLANT
NEEDLE HYPO 22GX1.5 SAFETY (NEEDLE) ×3 IMPLANT
NEEDLE HYPO 25X5/8 SAFETYGLIDE (NEEDLE) ×3 IMPLANT
NS IRRIG 1000ML POUR BTL (IV SOLUTION) ×3 IMPLANT
PACK C SECTION WH (CUSTOM PROCEDURE TRAY) ×3 IMPLANT
PAD ABD 7.5X8 STRL (GAUZE/BANDAGES/DRESSINGS) ×3 IMPLANT
PAD OB MATERNITY 4.3X12.25 (PERSONAL CARE ITEMS) ×3 IMPLANT
PENCIL SMOKE EVAC W/HOLSTER (ELECTROSURGICAL) ×3 IMPLANT
RTRCTR C-SECT PINK 25CM LRG (MISCELLANEOUS) IMPLANT
SUT PDS AB 0 CTX 36 PDP370T (SUTURE) IMPLANT
SUT PLAIN 2 0 XLH (SUTURE) IMPLANT
SUT VIC AB 0 CTX 36 (SUTURE) ×6
SUT VIC AB 0 CTX36XBRD ANBCTRL (SUTURE) ×2 IMPLANT
SUT VIC AB 4-0 KS 27 (SUTURE) ×3 IMPLANT
SYR CONTROL 10ML LL (SYRINGE) ×3 IMPLANT
TOWEL OR 17X24 6PK STRL BLUE (TOWEL DISPOSABLE) ×3 IMPLANT
TRAY FOLEY W/BAG SLVR 14FR LF (SET/KITS/TRAYS/PACK) ×3 IMPLANT
WATER STERILE IRR 1000ML POUR (IV SOLUTION) ×3 IMPLANT

## 2019-10-23 NOTE — Lactation Note (Signed)
This note was copied from a baby's chart. Lactation Consultation Note  Patient Name: Tara Stark [id number] Date: 10/23/2019   Initial visit attempted at 5 hours of life. Infant has already had a formula feeding (a previous RN noted that Mom was too sleepy to breastfeed).   Mom was sleeping when I entered room. She had fallen asleep with a cup of iced water in her hand, but awakened when I removed if from her hand. I asked how she was & she replied, "I am very sleepy." I told her that I would return at a later time.   Mom is a P3 who chooses BR/FO. She had a C/S for an abruption & is also on Magnesium. She is noted to be on: Amlodipine 10 mg (L3) Gabapentin 300mg  bid (L2) Fluconazole 150 mg qd (L2)  Lactation brochure was left in the room.   Matthias Hughs [facility] 10/23/2019, 9:19 AM

## 2019-10-23 NOTE — Progress Notes (Addendum)
Tara Stark is a 39 y.o. G3P2002 at [redacted]w[redacted]d admitted for IOL 2/2 cHTN, now with superimposed Pre-E (w/o severe features).  Subjective: Comfortable with epidural.  Objective: BP (!) 156/93   Pulse 94   Temp 97.8 F (36.6 C) (Oral)   Resp 18   Ht 4\' 11"  (1.499 m)   Wt 87 kg   LMP 01/14/2019   SpO2 97%   BMI 38.72 kg/m  No intake/output data recorded.  FHT:  FHR: 120-125 bpm, variability: moderate,  accelerations:  Present,  decelerations:  Absent UC:  q2-73m  SVE:   Dilation: 9 Effacement (%): 90, 100 Station: -1 Exam by:: Alvira Philips, RN  Labs: Lab Results  Component Value Date   WBC 10.9 (H) 10/22/2019   HGB 11.7 (L) 10/22/2019   HCT 36.0 10/22/2019   MCV 82.4 10/22/2019   PLT 220 10/22/2019    Assessment / Plan: 39 y.o. G3P2002 [redacted]w[redacted]d for IOL for cHTN.  #Labor: Progressing well. MVU's adequate without Pit; restart PRN. S/p AROM. Anticipate SVD. Report of some vaginal bleeding throughout the day. Only mucous-like bloody show currently. FHR reassuring. Continue to monitor. EFW 3900g, only proven to 3300g. #Pain: Epidural #FWB: Cat I #GBS positive - PCN #cHTN with superimposed Pre-E: Now Severe Pre-E by BP's. Labetalol and Mag ordered. Patient asymptomatic.  #Vaginal yeast- Diflucan 150 mg ordered post-partum   Chauncey Mann, MD OB Fellow, Faculty Practice 10/23/2019, 12:46 AM

## 2019-10-23 NOTE — Progress Notes (Addendum)
Went bedside for prolonged deceleration. Patient vomiting. SVE 0 to +1 station. No descent with pushing. Some recovery but FHR to 80's after each ctx. Hx of shoulder dystocia with Erb palsy with 3300g baby and this baby measuring much larger. Dr. Harolyn Rutherford called to room. Decision made to proceed with urgent cesarean section. The risks of cesarean section were discussed with the patient including but were not limited to: bleeding which may require transfusion or reoperation; infection which may require antibiotics; injury to bowel, bladder, ureters or other surrounding organs; injury to the fetus; need for additional procedures including hysterectomy in the event of a life-threatening hemorrhage; placental abnormalities wth subsequent pregnancies, incisional problems, thromboembolic phenomenon and other postoperative/anesthesia complications.  Patient also desires permanent sterilization.  Other reversible forms of contraception were discussed with patient; she declines all other modalities. Risks of procedure discussed with patient including but not limited to: risk of regret, permanence of method, bleeding, infection, injury to surrounding organs and need for additional procedures.  Failure risk of about 1% with increased risk of ectopic gestation if pregnancy occurs was also discussed with patient.  Also discussed possibility of post-tubal pain syndrome. The patient concurred with the proposed plan, giving informed written consent for the procedures.  Patient has been NPO s> 12 hours and she will remain NPO for procedure. Anesthesia and OR aware.  Preoperative prophylactic antibiotics and SCDs ordered on call to the OR.  To OR when ready.  Barrington Ellison, MD OB Family Medicine Fellow, [facility] for [facility], [place] of Attending Supervision of Obstetric Fellow: Evaluation and management procedures were performed by the Obstetric Fellow under my  supervision and collaboration.  I have reviewed the Obstetric Fellow's note and chart, and I agree with the management and plan. I have also made any necessary editorial changes.  I also met with patient and reviewed plan as outlined above. To OR now.   Verita Schneiders, MD, [place] Attending [place], [facility] for [employer], [place]

## 2019-10-23 NOTE — Progress Notes (Signed)
Tara Stark is a 39 y.o. G3P2002 at [redacted]w[redacted]d admitted for IOL 2/2 cHTN, now with superimposed Pre-E (w/o severe features).  Subjective: Comfortable with epidural. Not feeling pressure.   Objective: BP (!) 151/87   Pulse 86   Temp 97.9 F (36.6 C) (Oral)   Resp 14   Ht 4\' 11"  (1.499 m)   Wt 87 kg   LMP 01/14/2019   SpO2 100%   BMI 38.72 kg/m  Total I/O In: 0  Out: 325 [Urine:325]  FHT:  FHR: 125 bpm, variability: moderate,  accelerations:  Present,  decelerations:  Absent UC:  q2-79m  SVE:   Dilation: 10 Effacement (%): 100 Station: 0, Plus 1 Exam by:: Dr. Marice Potter  Labs: Lab Results  Component Value Date   WBC 10.9 (H) 10/22/2019   HGB 11.7 (L) 10/22/2019   HCT 36.0 10/22/2019   MCV 82.4 10/22/2019   PLT 220 10/22/2019    Assessment / Plan: 39 y.o. [id number] [redacted]w[redacted]d for IOL for cHTN.  #Labor: Progressing well. MVU's have been adequate but not currently; will restart Pit. Now complete and did a few practice pushes without descent. Patient not feeling pressure, will labor down as long as FHR reassuring. Bleeding persists and now more than bloody show, will monitor bleeding and FHR closely. EFW 3900g, only proven to 3300g. Hx of shoulder dystocia with Erb palsy. Anticipate SVD.  #Pain: Epidural #FWB: Cat I currently, was Cat II during vomiting  #GBS positive - PCN #cHTN with superimposed Pre-E: Now Severe Pre-E by BP's. Labetalol and Mag ordered. Patient asymptomatic.  #Vaginal yeast- Diflucan 150 mg ordered post-partum   Chauncey Mann, MD OB Fellow, Faculty Practice 10/23/2019, 2:19 AM

## 2019-10-23 NOTE — Lactation Note (Signed)
This note was copied from a baby's chart. Lactation Consultation Note  Patient Name: Tara Stark [id number] Date: 10/23/2019 Reason for consult: Follow-up assessment Per mom, she has been breastfeeding infant and feels it is going well, infant is feeding 15 minutes and giving formula every other feeding. Per mom, she has one and two year old at home, she was leaking in her pregnancy, her milk did not dry up with her [age] year.  Mom will start breastfeeding  for each feeding to establish milk ; then supplement,  with EBM that is pumped first , before giving formula.  LC unable to assess latch at this time due to infant receiving 27 mls of Gerber with iron formula, infant asleep in basinet. LC discussed using DEBP every 3 hours for 15 minutes on initial setting, mom will give EBM first before formula that is pumped. Mom was pumping as LC left room and had already expressed 8 mls  and was still pumping when Swift left room. LC gave mom handout that explains breastfeeding supplementation amounts based on infant's age and hours of life. Mom's plan first 24 hours: 1. Mom will breastfed according to hunger cues, 8 to 12 times within 24 hours and not exceed 3 hours without breastfeeding infant. 2. Mom will pump every 3 hours on initial setting and give back any EBM first before offering formula. 3. Mom knows to call RN or [place] if she has any questions, concerns or need assistance with latching infant at breast.   Maternal Data    Feeding Feeding Type: Breast Fed Nipple Type: Slow - flow  LATCH Score                   Interventions Interventions: Hand express;Expressed milk;DEBP;Hand pump  Lactation Tools Discussed/Used Pump Review: Setup, frequency, and cleaning;Milk Storage Initiated by:: Vicente Serene, IBCLC Date initiated:: 10/23/19   Consult Status Consult Status: Follow-up Date: 10/24/19 Follow-up type: In-patient    Vicente Serene 10/23/2019, 9:47 PM

## 2019-10-23 NOTE — Discharge Summary (Signed)
Postpartum Discharge Summary  Date of Service updated4/1     Patient Name: Tara Stark DOB: 1981-04-06 MRN: [id number]  Date of admission: 10/22/2019 Delivering Provider: Verita Schneiders A   Date of discharge: 10/26/2019  Admitting diagnosis: Chronic hypertension affecting pregnancy [O10.919] Intrauterine pregnancy: [redacted]w[redacted]d    Secondary diagnosis:  Principal Problem:   Severe preeclampsia superimposed on chronic hypertension, delivered Active Problems:   Uterine fibroid   Supervision of high risk pregnancy, antepartum   Chronic hypertension   Severe preeclampsia   S/P cesarean section for arrest of descent   Placental abruption, delivered  Additional problems: None     Discharge diagnosis: Term Pregnancy Delivered, Preeclampsia (severe) and CHTN with superimposed preeclampsia                                                                                                Post partum procedures:none  Augmentation: AROM and Pitocin  Complications: Placental Abruption  Hospital course:  Induction of Labor With Cesarean Section  39y.o. yo G3P2002 at [id number] admitted to the hospital 10/22/2019 for induction of labor. Patient had a labor course significant for IOL secondary to cHTN with superimposed severe Pre-E. Received Pit and had AROM. Progressed to complete without fetal descent with pushing and suspected abruption with moderate bleeding throughout the day. The patient went for cesarean section due to Non-Reassuring FHR and failure to descend, and delivered a Viable infant,10/23/2019   Membrane Rupture Time/Date: 4:28 PM ,10/22/2019   Details of operation can be found in separate operative Note. Large placental abruption noted intra-operatively. Patient was diagnosed with severe Pre-E during labor course and started on Magnesium. BP's monitored post-partum and were normotensive to mild range. Bilateral salpingectomy done. She was continued on PTA Norvasc. Patient had an  uncomplicated postpartum course. She is ambulating, tolerating a regular diet, passing flatus, and urinating well.  Patient is discharged home in stable condition on 10/26/19.                                   Delivery time: 4:09 AM    Magnesium Sulfate received: Yes BMZ received: No Rhophylac:No MMR:No Transfusion:No  Physical exam  Vitals:   10/25/19 2003 10/25/19 2305 10/26/19 0422 10/26/19 0900  BP: 123/71 121/65 127/82 128/85  Pulse: 99 93 85 98  Resp: 18 18  18   Temp: 98.5 F (36.9 C) 98.4 F (36.9 C)  98.6 F (37 C)  TempSrc: Oral Oral  Oral  SpO2: 97% 100%  99%  Weight:      Height:       General: alert, cooperative and no distress Lochia: appropriate Uterine Fundus: firm Incision: Healing well with no significant drainage DVT Evaluation: No evidence of DVT seen on physical exam. Labs: Lab Results  Component Value Date   WBC 10.9 (H) 10/22/2019   HGB 11.7 (L) 10/22/2019   HCT 36.0 10/22/2019   MCV 82.4 10/22/2019   PLT 220 10/22/2019   CMP Latest Ref Rng & Units 10/22/2019  Glucose 70 - 99  mg/dL 87  BUN 6 - 20 mg/dL 5(L)  Creatinine 0.44 - 1.00 mg/dL 0.40(L)  Sodium 135 - 145 mmol/L 136  Potassium 3.5 - 5.1 mmol/L 4.1  Chloride 98 - 111 mmol/L 103  CO2 22 - 32 mmol/L 22  Calcium 8.9 - 10.3 mg/dL 8.6(L)  Total Protein 6.5 - 8.1 g/dL 5.7(L)  Total Bilirubin 0.3 - 1.2 mg/dL 0.6  Alkaline Phos 38 - 126 U/L 184(H)  AST 15 - 41 U/L 25  ALT 0 - 44 U/L 18   Edinburgh Score: Edinburgh Postnatal Depression Scale Screening Tool 11/02/2018  I have been able to laugh and see the funny side of things. 0  I have looked forward with enjoyment to things. 0  I have blamed myself unnecessarily when things went wrong. 0  I have been anxious or worried for no good reason. 0  I have felt scared or panicky for no good reason. 0  Things have been getting on top of me. 0  I have been so unhappy that I have had difficulty sleeping. 0  I have felt sad or miserable. 0  I  have been so unhappy that I have been crying. 0  The thought of harming myself has occurred to me. 0  Edinburgh Postnatal Depression Scale Total 0    Discharge instruction: per After Visit Summary and "Baby and Me Booklet".  After visit meds:   Diet: No evidence of DVT seen on physical exam.  Activity: Advance as tolerated. Pelvic rest for 6 weeks.   Outpatient follow up:4 weeks Follow up Appt: Future Appointments  Date Time Provider [place]  10/31/2019  2:30 PM [facility] NURSE [facility]   Follow up Visit:    Please schedule this patient for Postpartum visit in: 4 weeks with the following provider: MD In-Person For C/S patients schedule nurse incision check in weeks 2 weeks: yes High risk pregnancy complicated by: HTN Delivery mode:  CS Anticipated Birth Control:  salpingectomy done PP Procedures needed: BP and incision check Schedule Integrated BH visit: no     Newborn Data: Live born female  Birth Weight: 3459g  APGAR: 31, 8  Newborn Delivery   Birth date/time: 10/23/2019 04:09:00 Delivery type: C-Section, Low Transverse Trial of labor: No C-section categorization: Primary      Baby Feeding: Breast Disposition:rooming in   10/26/2019 Cherre Blanc, MD

## 2019-10-23 NOTE — Op Note (Addendum)
Tara Stark PROCEDURE DATE: 10/23/2019  PREOPERATIVE DIAGNOSES: Intrauterine pregnancy at [redacted]w[redacted]d weeks gestation; prolonged fetal heart rate decelerations in second stage; failure to progress: arrest of descent; undesired fertility  POSTOPERATIVE DIAGNOSES: The same, placental abruption  PROCEDURE: Urgent Primary Low Transverse Cesarean Section, Bilateral Tubal Sterilization using Bilateral Salpingectomy  SURGEON:  Dr. Verita Schneiders  ASSISTANT:  Barrington Ellison  ANESTHESIOLOGY TEAM: Anesthesiologist: Oleta Mouse, MD; CRNA: Genevie Ann, CRNA  INDICATIONS: Tara Stark is a 39 y.o. [phone] at [redacted]w[redacted]d here for cesarean section and bilateral tubal sterilization secondary to the indications listed under preoperative diagnoses; please see preoperative note for further details.  The risks of surgery were discussed with the patient including but were not limited to: bleeding which may require transfusion or reoperation; infection which may require antibiotics; injury to bowel, bladder, ureters or other surrounding organs; injury to the fetus; need for additional procedures including hysterectomy in the event of a life-threatening hemorrhage; placental abnormalities wth subsequent pregnancies, incisional problems, thromboembolic phenomenon and other postoperative/anesthesia complications.  Patient also desires permanent sterilization.  Other reversible forms of contraception were discussed with patient; she declines all other modalities. Risks of procedure discussed with patient including but not limited to: risk of regret, permanence of method, bleeding, infection, injury to surrounding organs and need for additional procedures.  Failure risk of <1% with increased risk of ectopic gestation if pregnancy occurs was also discussed with patient. . The patient concurred with the proposed plan, giving informed written consent for the procedures.    FINDINGS:  Viable female infant in cephalic  presentation.  Apgars 6 and 8, arterial cord pH 7.12.  Bloody amniotic fluid.  Intact placenta with large retroplacental clot (~500 ml), three vessel cord.  Normal uterus, fallopian tubes and ovaries bilaterally. 6 cm fundal midline anterior intramural fibroid noted.   Fallopian tubes were sterilized bilaterally by salpingectomy.  ANESTHESIA: Epidural INTRAVENOUS FLUIDS: 2000 ml ESTIMATED BLOOD LOSS:350 ml (and about 500 ml in uterus due to abruption) URINE OUTPUT:  150 ml SPECIMENS: Placenta and bilateral fallopian tubes sent to pathology COMPLICATIONS: None immediate   PROCEDURE IN DETAIL:  The patient was urgently taken to the the operating room her epidural anesthesia was dosed up to surgical level and was found to be adequate. She was then placed in a dorsal supine position with a leftward tilt, prepped quickly with betadine and draped in a sterile manner.  She already had a foley catheter in her bladder from L&D.  After a timeout was performed, a Pfannenstiel skin incision was made with scalpel and carried through to the underlying layer of fascia. The fascial incision was extended bilaterally in a blunt fashion.  The fascia was separated from underlying rectus muscles bluntly.  The rectus muscles were separated in the midline bluntly and the peritoneum was entered bluntly. Attention was turned to the lower uterine segment where a low transverse hysterotomy was made with a scalpel and extended bilaterally bluntly.  The infant was successfully delivered, the cord was clamped and cut and the infant was handed over to awaiting neonatology team. Incision to delivery time was less than one minute.  The placenta was delivered intact with a large retroplacental clot and had a three-vessel cord. The uterus was then cleared of clot and debris.  The hysterotomy was closed with 0 Vicryl in a running locked fashion, and an imbricating layer was also placed with 0 Vicryl. The pelvis was cleared of all clot and  debris. Hemostasis was confirmed on all  surfaces.   Attention was then turned to the left fallopian tube. Kelly forceps were placed on the mesosalpinx underneath the entire tube.  This pedicle was double suture ligated with 2-0 Vicryl, and the tube including the fimbriated end was excised.  The right fallopian tube was then identified, doubly ligated, and was excised in a similar fashion allowing for bilateral tubal sterilization via bilateral salpingectomy.  Good hemostasis was noted overall.  The pelvis was cleared of all clot and debris. Hemostasis was confirmed on all surfaces.  The retractor was removed.  The peritoneum was closed with a 0 Vicryl running stitch. The fascia was then closed using 0 Vicryl in a running fashion.  The subcutaneous layer was irrigated, reapproximated with 2-0 plain gut interrupted stitches, and the skin was closed with a 4-0 Vicryl subcuticular stitch. The patient tolerated the procedure well. Sponge, instrument and needle counts were correct x 3.  She was taken to the recovery room in stable condition.    Verita Schneiders, MD, [place] for [employer], [place]

## 2019-10-23 NOTE — Transfer of Care (Signed)
Immediate Anesthesia Transfer of Care Note  Patient: Tara Stark  Procedure(s) Performed: CESAREAN SECTION (N/A )  Patient Location: PACU  Anesthesia Type:Epidural  Level of Consciousness: awake, alert  and oriented  Airway & Oxygen Therapy: Patient Spontanous Breathing  Post-op Assessment: Report given to RN and Post -op Vital signs reviewed and stable  Post vital signs: Reviewed and stable  Last Vitals:  Vitals Value Taken Time  BP 115/96 10/23/19 0508  Temp    Pulse 78 10/23/19 0510  Resp 19 10/23/19 0510  SpO2 100 % 10/23/19 0510  Vitals shown include unvalidated device data.  Last Pain:  Vitals:   10/23/19 0145  TempSrc: Oral  PainSc:          Complications: No apparent anesthesia complications

## 2019-10-23 NOTE — Anesthesia Postprocedure Evaluation (Signed)
Anesthesia Post Note  Patient: Tara Stark  Procedure(s) Performed: CESAREAN SECTION (N/A )     Patient location during evaluation: PACU Anesthesia Type: Epidural Level of consciousness: awake, awake and alert and oriented Pain management: pain level controlled Vital Signs Assessment: post-procedure vital signs reviewed and stable Respiratory status: spontaneous breathing, nonlabored ventilation and respiratory function stable Cardiovascular status: stable Postop Assessment: no headache, no backache, epidural receding, patient able to bend at knees and no signs of nausea or vomiting Anesthetic complications: no    Last Vitals:  Vitals:   10/23/19 0600 10/23/19 0615  BP: (!) 144/99   Pulse: 78   Resp: 18 18  Temp:    SpO2: 100%     Last Pain:  Vitals:   10/23/19 0615  TempSrc:   PainSc: 0-No pain   Pain Goal:                Epidural/Spinal Function Cutaneous sensation: Tingles (10/23/19 0600), Patient able to flex knees: Yes (10/23/19 0600), Patient able to lift hips off bed: No (10/23/19 0600), Back pain beyond tenderness at insertion site: No (10/23/19 0600), Progressively worsening motor and/or sensory loss: No (10/23/19 0600), Bowel and/or bladder incontinence post epidural: No (10/23/19 0600)  Catalina Gravel

## 2019-10-24 LAB — CBC
HCT: 25.4 % — ABNORMAL LOW (ref 36.0–46.0)
Hemoglobin: 8.4 g/dL — ABNORMAL LOW (ref 12.0–15.0)
MCH: 27.3 pg (ref 26.0–34.0)
MCHC: 33.1 g/dL (ref 30.0–36.0)
MCV: 82.5 fL (ref 80.0–100.0)
Platelets: 184 10*3/uL (ref 150–400)
RBC: 3.08 MIL/uL — ABNORMAL LOW (ref 3.87–5.11)
RDW: 15.7 % — ABNORMAL HIGH (ref 11.5–15.5)
WBC: 23.7 10*3/uL — ABNORMAL HIGH (ref 4.0–10.5)
nRBC: 0 % (ref 0.0–0.2)

## 2019-10-24 LAB — SURGICAL PATHOLOGY

## 2019-10-24 NOTE — Lactation Note (Signed)
This note was copied from a baby's chart. Lactation Consultation Note  Patient Name: Tara Stark [id number] Date: 10/24/2019 Reason for consult: Follow-up assessment   P3, Baby 81 hours old and receiving bottle of formula after breastfeeding for 15 min. Mother is Ex BF and denies questions or concerns. She will pump as able.  Mother is aware of the importance of breastfeeding before offering formula to help establish her milk supply. Mother has personal DEBP at home.    Maternal Data    Feeding Feeding Type: Breast Fed  LATCH Score Latch: Grasps breast easily, tongue down, lips flanged, rhythmical sucking.  Audible Swallowing: Spontaneous and intermittent  Type of Nipple: Everted at rest and after stimulation  Comfort (Breast/Nipple): Soft / non-tender  Hold (Positioning): No assistance needed to correctly position infant at breast.  LATCH Score: 10  Interventions    Lactation Tools Discussed/Used     Consult Status      Vivianne Master [facility] 10/24/2019, 1:17 PM

## 2019-10-24 NOTE — Progress Notes (Signed)
Subjective: Postpartum Day 1  Cesarean Delivery and BTL Patient reports tolerating PO and no problems voiding.  Baby is breastfeeding well. She wants a circumcision for her son, understands the consent.  Objective: Vital signs in last 24 hours: Temp:  [98.1 F (36.7 C)-98.4 F (36.9 C)] 98.3 F (36.8 C) (03/29 2009) Pulse Rate:  [84-107] 84 (03/30 0119) Resp:  [16-19] 19 (03/30 0430) BP: (128-155)/(64-88) 128/64 (03/30 0119) SpO2:  [95 %-97 %] 97 % (03/30 0119)  Physical Exam:  General: alert Lochia: appropriate Uterine Fundus: firm Incision: Dressing clean, dry, and intact DVT Evaluation: No evidence of DVT seen on physical exam.  Recent Labs    10/22/19 0943 10/23/19 0602  HGB 11.7* 11.1*  HCT 36.0 34.9*    Assessment/Plan: Status post Cesarean section. Doing well postoperatively.  Continue current care Mag to be stopped now Continue norvasc 10 mg daily.  Emily Filbert 10/24/2019, 6:11 AM

## 2019-10-25 LAB — CREATININE, SERUM
Creatinine, Ser: 0.52 mg/dL (ref 0.44–1.00)
GFR calc Af Amer: >60 mL/min (ref >60)
GFR calc non Af Amer: >60 mL/min (ref >60)

## 2019-10-25 MED ORDER — INFLUENZA VAC SPLIT QUAD 0.5 ML IM SUSY: 0.5000 mL | PREFILLED_SYRINGE | INTRAMUSCULAR | Status: DC

## 2019-10-25 NOTE — Lactation Note (Signed)
This note was copied from a baby's chart. Lactation Consultation Note  Patient Name: Tara Stark [id number] Date: 10/25/2019   Baby 54 hours old.  Mother is breastfeeding and formula feeding. Bilirubin is being evaluated.  Encouraged mother to post pump.  Mother denies questions or concerns.  Reviewed engorgement care and monitoring voids/stools.       Maternal Data    Feeding Feeding Type: Breast Fed  LATCH Score                   Interventions    Lactation Tools Discussed/Used     Consult Status      Carlye Grippe 10/25/2019, 6:28 PM

## 2019-10-25 NOTE — Progress Notes (Addendum)
Subjective: Postpartum Day 2 Cesarean Delivery and BTL Patient reports tolerating PO and no problems voiding.  Baby is breastfeeding well. She wants a circumcision for her son, understands the consent.  Objective: Vital signs in last 24 hours: Temp:  [98.2 F (36.8 C)-98.7 F (37.1 C)] 98.2 F (36.8 C) (03/31 1147) Pulse Rate:  [80-113] 113 (03/31 1147) Resp:  [18-20] 18 (03/31 1147) BP: (121-148)/(66-79) 121/79 (03/31 1147) SpO2:  [96 %-100 %] 100 % (03/31 1147)  Physical Exam:  General: alert Lochia: appropriate Uterine Fundus: firm Incision: Dressing clean, dry, and intact DVT Evaluation: No evidence of DVT seen on physical exam.  Recent Labs    10/23/19 0602 10/24/19 0628  HGB 11.1* 8.4*  HCT 34.9* 25.4*    Assessment/Plan: Status post Cesarean section. Doing well postoperatively.  Continue current care encourage ambulation.  S/p Mag  Continue norvasc 10 mg daily. Likely d/c home tomorrow.   Cherre Blanc 10/25/2019, 1:33 PM

## 2019-10-26 ENCOUNTER — Other Ambulatory Visit: Payer: Medicaid Other

## 2019-10-26 ENCOUNTER — Encounter: Payer: Medicaid Other | Admitting: Women's Health

## 2019-10-26 MED ORDER — FERROUS SULFATE 325 (65 FE) MG PO TABS: 325.0000 mg | ORAL_TABLET | Freq: Two times a day (BID) | ORAL | 1 refills | Status: AC

## 2019-10-26 MED ORDER — FERROUS SULFATE 325 (65 FE) MG PO TABS: 325.0000 mg | ORAL_TABLET | Freq: Two times a day (BID) | ORAL | 3 refills | Status: DC

## 2019-10-26 MED ORDER — IBUPROFEN 800 MG PO TABS: 800.0000 mg | ORAL_TABLET | Freq: Three times a day (TID) | ORAL | 1 refills | Status: DC | PRN

## 2019-10-26 MED ORDER — IBUPROFEN 800 MG PO TABS: 800.0000 mg | ORAL_TABLET | Freq: Four times a day (QID) | ORAL | 0 refills | Status: DC

## 2019-10-26 MED ORDER — SIMETHICONE 80 MG PO CHEW: 80.0000 mg | CHEWABLE_TABLET | ORAL | 0 refills | Status: DC

## 2019-10-26 MED ORDER — OXYCODONE-ACETAMINOPHEN 5-325 MG PO TABS: 1.0000 | ORAL_TABLET | Freq: Four times a day (QID) | ORAL | 0 refills | Status: DC | PRN

## 2019-10-26 MED ORDER — SIMETHICONE 80 MG PO CHEW: 80.0000 mg | CHEWABLE_TABLET | Freq: Four times a day (QID) | ORAL | 0 refills | Status: DC | PRN

## 2019-10-26 MED ORDER — OXYCODONE-ACETAMINOPHEN 5-325 MG PO TABS: 1.0000 | ORAL_TABLET | Freq: Four times a day (QID) | ORAL | 0 refills | Status: AC | PRN

## 2019-10-26 MED FILL — MI-ACID GAS 80 MG TAB CHEW: 80 | 8 days supply | Qty: 30 | Fill #0

## 2019-10-26 MED FILL — FERROUS SULFATE 325 MG TAB: 325 (65 FE) | 30 days supply | Qty: 60 | Fill #0

## 2019-10-26 MED FILL — IBUPROFEN 800 MG TAB: 800 | 10 days supply | Qty: 30 | Fill #0

## 2019-10-26 MED FILL — OXYCODONE-APAP 5-325MG: 5-325 | 5 days supply | Qty: 20 | Fill #0

## 2019-10-26 NOTE — Progress Notes (Signed)
Follow up care reviewed with patient. Medications and prescriptions discussed. Discharge instructions reviewed. Patient verbalized understanding of instructions. Pain management discussed and Preeclampsia information given and reviewed. Honeycomb dressing and care discussed.

## 2019-10-26 NOTE — Progress Notes (Signed)
Subjective: Postpartum Day 3 Cesarean Delivery and BTL Patient reports tolerating PO, + flatus, + BM and no problems voiding.  Baby is breastfeeding well. She wants a circumcision for her son, consented.  Objective: Vital signs in last 24 hours: Temp:  [98.2 F (36.8 C)-98.6 F (37 C)] 98.6 F (37 C) (04/01 0900) Pulse Rate:  [85-113] 98 (04/01 0900) Resp:  [18] 18 (04/01 0900) BP: (121-137)/(65-85) 128/85 (04/01 0900) SpO2:  [97 %-100 %] 99 % (04/01 0900)  Physical Exam:  General: alert Lochia: appropriate Uterine Fundus: firm Incision: clean, dry, and intact DVT Evaluation: No evidence of DVT seen on physical exam.  Recent Labs    10/24/19 0628  HGB 8.4*  HCT 25.4*    Assessment/Plan: Status post Cesarean section. Doing well postoperatively.  Continue current care encourage ambulation.  S/p Mag  Continue norvasc 10 mg daily. Meeting postoperative goals, plan for d/c home.   Cherre Blanc 10/26/2019, 10:45 AM

## 2019-10-26 NOTE — Lactation Note (Signed)
This note was copied from a baby's chart. Lactation Consultation Note  Patient Name: Boy Izabela Lambo [id number] Date: 10/26/2019 Reason for consult: Follow-up assessment;Early term 37-38.6wks;Hyperbilirubinemia  LC in to visit with P3 Mom of ET infant at 40 hrs old.  Baby at 5% weight loss with good output.    Baby is breastfeeding well, feeding for 20-25 min every 2-3 hrs.  Mom supplementing with formula by bottle, volume of last feeding was 45 ml.  Mom choosing to use formula by bottle.   Encouraged Mom to always offer breast first.  Engorgement prevention and treatment reviewed.   Bilirubin elevated this am.  Mom hoping to be discharged with baby today.  Mom doesn't have a DEBP at home, but does have [place].    Encouraged STS and offering breast often with cues.  Mom isn't pumping currently.  Encouraged breast massage and hand expression and increased breastfeeding as breasts are filling.  Mom aware of OP lactation support available to her.  Mom denies any questions currently.  Interventions Interventions: Breast feeding basics reviewed;Skin to skin;Breast massage;Hand express;DEBP  Lactation Tools Discussed/Used Tools: Bottle;Pump Breast pump type: Double-Electric Breast Pump   Consult Status Consult Status: Complete Date: 10/26/19 Follow-up type: Call as needed    Broadus John 10/26/2019, 8:33 AM

## 2019-10-27 ENCOUNTER — Ambulatory Visit: Payer: Self-pay

## 2019-10-27 NOTE — Lactation Note (Signed)
This note was copied from a baby's chart. Lactation Consultation Note  Patient Name: Tara Stark [id number] Date: 10/27/2019 Reason for consult: Follow-up assessment;Hyperbilirubinemia Baby is 71 days old.  Phototherapy discontinued and baby will be discharged.  Breasts are full and baby is latching without difficulty.  Observed baby latch with ease using cradle hold.  Mom has a manual pump but plans on purchasing a DEBP.  No questions or concerns.  Reviewed outpatient services and encouraged to call prn.  Maternal Data    Feeding Feeding Type: Breast Fed  LATCH Score Latch: Grasps breast easily, tongue down, lips flanged, rhythmical sucking.  Audible Swallowing: Spontaneous and intermittent  Type of Nipple: Everted at rest and after stimulation  Comfort (Breast/Nipple): Soft / non-tender  Hold (Positioning): No assistance needed to correctly position infant at breast.  LATCH Score: 10  Interventions    Lactation Tools Discussed/Used     Consult Status Consult Status: Complete Follow-up type: Call as needed    Ave Filter 10/27/2019, 10:51 AM

## 2019-10-30 ENCOUNTER — Encounter: Payer: Medicaid Other | Admitting: Obstetrics and Gynecology

## 2019-10-31 ENCOUNTER — Telehealth: Payer: Medicaid Other

## 2019-10-31 ENCOUNTER — Other Ambulatory Visit: Payer: Medicaid Other

## 2019-11-03 ENCOUNTER — Other Ambulatory Visit: Payer: Medicaid Other

## 2019-11-03 ENCOUNTER — Encounter: Payer: Medicaid Other | Admitting: Obstetrics and Gynecology

## 2019-11-07 ENCOUNTER — Other Ambulatory Visit: Payer: Medicaid Other

## 2019-11-10 ENCOUNTER — Encounter: Payer: Medicaid Other | Admitting: Obstetrics & Gynecology

## 2019-11-10 ENCOUNTER — Other Ambulatory Visit: Payer: Medicaid Other

## 2019-11-13 ENCOUNTER — Ambulatory Visit (INDEPENDENT_AMBULATORY_CARE_PROVIDER_SITE_OTHER): Payer: Medicaid Other | Admitting: Internal Medicine

## 2019-11-13 ENCOUNTER — Encounter (INDEPENDENT_AMBULATORY_CARE_PROVIDER_SITE_OTHER): Payer: Self-pay | Admitting: Internal Medicine

## 2019-11-13 ENCOUNTER — Other Ambulatory Visit: Payer: Self-pay

## 2019-11-13 VITALS — BP 140/80 | HR 101 | Temp 97.3°F | Ht 59.0 in | Wt 176.2 lb

## 2019-11-13 DIAGNOSIS — E559 Vitamin D deficiency, unspecified: Secondary | ICD-10-CM

## 2019-11-13 DIAGNOSIS — I10 Essential (primary) hypertension: Secondary | ICD-10-CM

## 2019-11-13 DIAGNOSIS — D649 Anemia, unspecified: Secondary | ICD-10-CM | POA: Diagnosis not present

## 2019-11-13 NOTE — Progress Notes (Signed)
Metrics: Intervention Frequency ACO  Documented Smoking Status Yearly  Screened one or more times in 24 months  Cessation Counseling or  Active cessation medication Past 24 months  Past 24 months   Guideline developer: UpToDate (See UpToDate for funding source) Date Released: 2014       Wellness Office Visit  Subjective:  Patient ID: Tara Stark, female    DOB: June 28, 1981  Age: 39 y.o. MRN: [id number]  CC: This lady is a now about 3 weeks postpartum and she is doing well.  She had hypertensive pregnancy and was switched to amlodipine.  She now comes for follow-up. HPI She was also noted to be anemic with a hemoglobin of 8.4.  She is doing well with amlodipine for blood pressure. She is continue to take iron supplementation and vitamin D3 10,000 units daily supplementation. She has no specific complaints at the present time. She had questions about COVID-19 vaccination which I answered today.  Past Medical History:  Diagnosis Date  . Acid indigestion   . Fibroid   . Hemorrhoids   . Hypertension   . Thyroid disease   . Vitamin D deficiency disease 04/18/2019      Family History  Problem Relation Age of Onset  . Other Paternal Grandfather        MVA  . Breast cancer Paternal Grandmother   . Alzheimer's disease Maternal Grandmother   . Cancer Maternal Grandfather        throat  . Diabetes Father   . Other Father        has a pacemaker  . Hypertension Mother   . Hypertension Brother   . Hypertension Sister   . Hypertension Sister   . Bronchiolitis Son   . 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   Social History   Tobacco Use  . Smoking status: Former Smoker    Years: 4.00    Types: Cigarettes  . Smokeless tobacco: Never Used  . Tobacco comment: 2-3 cigarettes per day  Substance Use Topics  . Alcohol use: No    Current Meds  Medication  Sig  . amLODipine (NORVASC) 10 MG tablet Take 1 tablet (10 mg total) by mouth daily.  . Cholecalciferol (VITAMIN D3) 250 MCG (10000 UT) TABS Take 10,000 Units by mouth daily.  . ferrous sulfate 325 (65 FE) MG tablet Take 1 tablet (325 mg total) by mouth 2 (two) times daily with a meal.  . ibuprofen (ADVIL) 800 MG tablet Take 1 tablet (800 mg total) by mouth every 8 (eight) hours as needed for headache, mild pain, moderate pain or cramping.  . [DISCONTINUED] butalbital-acetaminophen-caffeine (FIORICET) 50-325-40 MG tablet Take 1-2 tablets by mouth every 6 (six) hours as needed for headache.      Objective:   Today's Vitals: BP 140/80 (BP Location: Left Arm, Patient Position: Sitting, Cuff Size: Normal)   Pulse (!) 101   Temp (!) 97.3 F (36.3 C) (Temporal)   Ht 4\' 11"  (1.499 m)   Wt 176 lb 3.2 oz (79.9 kg)   LMP 01/14/2019   SpO2 97%   BMI 35.59 kg/m  Vitals with BMI 11/13/2019 10/26/2019 10/26/2019  Height 4\' 11"  - -  Weight 176 lbs 3 oz - -  BMI [id number] - -  Systolic [id number]  Diastolic 80 78 85  Pulse [id number] 99 98     Physical Exam  Her blood pressure is not too bad on current medications.  She is alert and orientated without any focal neurological signs.   Assessment   1. Vitamin D deficiency disease   2. Essential hypertension, benign   3. Anemia, unspecified type       Tests ordered No orders of the defined types were placed in this encounter.    Plan: 1. I recommended that she continue with vitamin D3 10,000 units daily. 2. She will continue with amlodipine for hypertension and we will continue to monitor closely. 3. She will continue with iron supplementation. 4. I will have her follow-up with Sarah in about a month's time and at that visit, she should have electrolytes, vitamin D levels and CBC done.   No orders of the defined types were placed in this encounter.   Doree Albee, MD

## 2019-11-18 ENCOUNTER — Ambulatory Visit: Payer: Medicaid Other

## 2019-11-27 ENCOUNTER — Ambulatory Visit: Payer: Medicaid Other | Admitting: Advanced Practice Midwife

## 2019-12-18 ENCOUNTER — Encounter: Payer: Self-pay | Admitting: *Deleted

## 2019-12-19 ENCOUNTER — Ambulatory Visit (INDEPENDENT_AMBULATORY_CARE_PROVIDER_SITE_OTHER): Payer: Medicaid Other | Admitting: Nurse Practitioner

## 2019-12-21 ENCOUNTER — Ambulatory Visit (INDEPENDENT_AMBULATORY_CARE_PROVIDER_SITE_OTHER): Payer: Medicaid Other | Admitting: Advanced Practice Midwife

## 2019-12-21 ENCOUNTER — Encounter: Payer: Self-pay | Admitting: Advanced Practice Midwife

## 2019-12-21 NOTE — Progress Notes (Signed)
[facility] Visit Note  Tara Stark is a 39 y.o. G30P3003 female who presents for a postpartum visit. She is 8 weeks postpartum following a primary cesarean section.  I have fully reviewed the prenatal and intrapartum course. The delivery was at 65 gestational weeks.  IOL for SIPE w/severe features,PCS for arrest of descent/hx Erb's palsey w/smaller baby. Anesthesia: epidural. Postpartum course has been uneventful.  PCP continued her Norvasc. Baby is doing well. Baby is feeding by both breast and bottle - [place] Good Start. Bleeding had a period last week. Bowel function is normal. Bladder function is normal. Patient is sexually active. Contraception method is tubal ligation. Postpartum depression screening: negative.  The following portions of the patient's history were reviewed and updated as appropriate: allergies, current medications, past family history, past medical history, past social history, past surgical history and problem list.  Review of Systems Pertinent items are noted in HPI.   Objective:  unknown if currently breastfeeding. General:  alert, cooperative, and no distress   Breasts:  negative  Lungs: Normal respiratory effort  Heart:  regular rate and rhythm  Abdomen: soft, non-tender;incision well healed   Vulva:  not examined  Vagina: not examined  Cervix:  not examined  Corpus: Well involuted  Adnexa:  not evaluated  Rectal Exam:  hemorrhoids        Assessment:    normal postpartum exam. Pap smear not done at today's visit.   Plan:   Essential components of care per ACOG recommendations:  1.  Mood and well being: Patient with negative depression screening today. Reviewed local resources for support.  - Patient does not use tobacco- hx of drug use? No  2. Infant care and feeding:  -Patient currently breastmilk feeding? Yes If breastmilk feeding discussed return to work and pumping. If needed, patient was provided letter for work to allow for every 2-3 hr  pumping breaks, and to be granted a private location to express breastmilk and refrigerated area to store breastmilk. Reviewed importance of draining breast regularly to support lactation. -Social determinants of health (SDOH) reviewed in EPIC. No concerns  3. Sexuality, contraception and birth spacing - Patient does not want a pregnancy in the next year.  Desired family size is 5 children.  - Reviewed forms of contraception in tiered fashion. Patient desired bilateral tubal ligation today.   - Discussed birth spacing of 18 months  4. Sleep and fatigue -Encouraged family/partner/community support of 4 hrs of uninterrupted sleep to help with mood and fatigue  5. Physical Recovery  - Discussed patients delivery and complications - - Patient has urinary incontinence? No  - Patient is safe to resume physical and sexual activity  6.  Health Maintenance - Last pap smear done 2020 and was normal with negative HPV.   7. Chronic Disease--CHTN -Has already had PCP follow up  Christin Fudge, [place] [facility] [place]

## 2020-02-15 ENCOUNTER — Ambulatory Visit (INDEPENDENT_AMBULATORY_CARE_PROVIDER_SITE_OTHER): Payer: Medicaid Other | Admitting: Internal Medicine

## 2020-02-26 ENCOUNTER — Ambulatory Visit (INDEPENDENT_AMBULATORY_CARE_PROVIDER_SITE_OTHER): Payer: Medicaid Other | Admitting: Nurse Practitioner

## 2020-02-28 ENCOUNTER — Ambulatory Visit (INDEPENDENT_AMBULATORY_CARE_PROVIDER_SITE_OTHER): Payer: Medicaid Other | Admitting: Nurse Practitioner

## 2020-03-06 ENCOUNTER — Ambulatory Visit
Admission: EM | Admit: 2020-03-06 | Discharge: 2020-03-06 | Disposition: A | Discharge status: Home / Self Care | Payer: Medicaid Other | Attending: Emergency Medicine | Admitting: Emergency Medicine

## 2020-03-06 ENCOUNTER — Encounter: Payer: Self-pay | Admitting: Emergency Medicine

## 2020-03-06 ENCOUNTER — Other Ambulatory Visit: Payer: Self-pay

## 2020-03-06 DIAGNOSIS — M545 Low back pain, unspecified: Secondary | ICD-10-CM

## 2020-03-06 DIAGNOSIS — M25562 Pain in left knee: Secondary | ICD-10-CM | POA: Diagnosis not present

## 2020-03-06 DIAGNOSIS — W19XXXA Unspecified fall, initial encounter: Secondary | ICD-10-CM

## 2020-03-06 MED ORDER — MELOXICAM 15 MG PO TABS: 15.0000 mg | ORAL_TABLET | Freq: Every day | ORAL | 0 refills | Status: DC

## 2020-03-06 MED ORDER — CYCLOBENZAPRINE HCL 10 MG PO TABS: 10.0000 mg | ORAL_TABLET | Freq: Every day | ORAL | 0 refills | Status: DC

## 2020-03-06 NOTE — Discharge Instructions (Signed)
Continue conservative management of rest, ice, elevation, and gentle stretches Knee brace given in office Take mobic as needed for pain relief (may cause abdominal discomfort, ulcers, and GI bleeds avoid taking with other NSAIDs) Take cyclobenzaprine at nighttime for symptomatic relief. Avoid driving or operating heavy machinery while using medication. Follow up with orthopedist for further evaluation and management Return or go to the ER if you have any new or worsening symptoms (fever, chills, chest pain, abdominal pain, changes in bowel or bladder habits, pain radiating into lower legs, etc...)

## 2020-03-06 NOTE — ED Triage Notes (Signed)
Pt accidentally stepped into a floor air vent.  C/o pain to LT knee and areas around her knee. Also c/c or pain to LT lower back.

## 2020-03-06 NOTE — ED Provider Notes (Signed)
[facility]   [id number] 03/06/20 Arrival Time: 0258  CC: LT knee and back pain  SUBJECTIVE: History from: patient. Tara Stark is a 39 y.o. female complains of LT low back and LT knee pain x 1 day.  Accidentally stepped onto floor air vent and fell through.  Localizes the pain to the LT low back and inside of LT knee.  Describes the pain as intermittent and catching in character.  Has tried OTC medications without relief.  Symptoms are made worse with walking.  Reports previous injury to LT knee.  Complains of abrasions to LT knee.  Denies fever, chills, ecchymosis, effusion, weakness, numbness and tingling, saddle paresthesias, loss of bowel or bladder function.      ROS: As per HPI.  All other pertinent ROS negative.     Past Medical History:  Diagnosis Date  . Acid indigestion   . Fibroid   . Hemorrhoids   . Hypertension   . Thyroid disease   . Vitamin D deficiency disease 04/18/2019   Past Surgical History:  Procedure Laterality Date  . CESAREAN SECTION N/A 10/23/2019   Procedure: CESAREAN SECTION;  Surgeon: Osborne Oman, MD;  Location: [facility];  Service: Obstetrics;  Laterality: N/A;  Urgent Abruption  . NO PAST SURGERIES     No Known Allergies No current facility-administered medications on file prior to encounter.   Current Outpatient Medications on File Prior to Encounter  Medication Sig Dispense Refill  . amLODipine (NORVASC) 10 MG tablet Take 1 tablet (10 mg total) by mouth daily. 30 tablet 4  . Cholecalciferol (VITAMIN D3) 250 MCG (10000 UT) TABS Take 10,000 Units by mouth daily.    . ferrous sulfate 325 (65 FE) MG tablet Take 1 tablet (325 mg total) by mouth 2 (two) times daily with a meal. 60 tablet 1   Social History   Socioeconomic History  . Marital status: Married    Spouse name: Shanecia Hoganson  . Number of children: 1  . Years of education: Not on file  . Highest education level: Associate degree: occupational, Hotel manager, or vocational  program  Occupational History  . Not on file  Tobacco Use  . Smoking status: Former Smoker    Years: 4.00    Types: Cigarettes  . Smokeless tobacco: Never Used  . Tobacco comment: 2-3 cigarettes per day  Vaping Use  . Vaping Use: Never used  Substance and Sexual Activity  . Alcohol use: No  . Drug use: No  . Sexual activity: Yes    Birth control/protection: None  Other Topics Concern  . 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   Social Determinants of Health   Financial Resource Strain:   . Difficulty of Paying Living Expenses:   Food Insecurity:   . Worried About Charity fundraiser in the Last Year:   . Arboriculturist in the Last Year:   Transportation Needs:   . Film/video editor (Medical):   Marland Kitchen Lack of Transportation (Non-Medical):   Physical Activity:   . Days of Exercise per Week:   . Minutes of Exercise per Session:   Stress:   . Feeling of Stress :   Social Connections:   . Frequency of Communication with Friends and Family:   . Frequency of Social Gatherings with Friends and Family:   . Attends Religious Services:   . Active Member of Clubs or Organizations:   .  Attends Archivist Meetings:   Marland Kitchen Marital Status:   Intimate Partner Violence:   . Fear of Current or Ex-Partner:   . Emotionally Abused:   Marland Kitchen Physically Abused:   . Sexually Abused:    Family History  Problem Relation Age of Onset  . Other Paternal Grandfather        MVA  . Breast cancer Paternal Grandmother   . Alzheimer's disease Maternal Grandmother   . Cancer Maternal Grandfather        throat  . Diabetes Father   . Other Father        has a pacemaker  . Hypertension Mother   . Hypertension Brother   . Hypertension Sister   . Hypertension Sister   . Bronchiolitis Son   . Hypertension Paternal Aunt     OBJECTIVE:  Vitals:   03/06/20 0850 03/06/20 0852  BP:  135/85  Pulse:  (!) 101    Resp:  18  Temp:  98.7 F (37.1 C)  TempSrc:  Oral  SpO2:  94%  Weight: 180 lb (81.6 kg)   Height: 5' (1.524 m)     General appearance: ALERT; in no acute distress.  Head: NCAT Lungs: Normal respiratory effort Musculoskeletal: Back; knee Inspection: Skin warm, dry, clear and intact without obvious erythema, effusion, or ecchymosis.  Abrasion to lateral aspect of LT knee Palpation: Diffusely TTP over LT lower paravertebral muscles; TTP over MJL and over MCL ROM: LROM about the knee Strength: 5/5 hip flexion, 5/5 knee abduction, 5/5 knee adduction, 5/5 knee flexion, 5/5 knee extension Stability: Anterior/ posterior drawer intact Skin: warm and dry Neurologic: Ambulates with minimal difficulty; Sensation intact about the lower extremities Psychological: alert and cooperative; normal mood and affect   ASSESSMENT & PLAN:  1. Acute left-sided low back pain without sciatica   2. Acute pain of left knee   3. Medial joint line tenderness of knee, left   4. Fall, initial encounter    Meds ordered this encounter  Medications  . meloxicam (MOBIC) 15 MG tablet    Sig: Take 1 tablet (15 mg total) by mouth daily.    Dispense:  30 tablet    Refill:  0    Order Specific Question:   Supervising Provider    Answer:   Raylene Everts [[id number]]  . cyclobenzaprine (FLEXERIL) 10 MG tablet    Sig: Take 1 tablet (10 mg total) by mouth at bedtime.    Dispense:  15 tablet    Refill:  0    Order Specific Question:   Supervising Provider    Answer:   Raylene Everts [[id number]]    Continue conservative management of rest, ice, elevation, and gentle stretches Knee brace given in office Take mobic as needed for pain relief (may cause abdominal discomfort, ulcers, and GI bleeds avoid taking with other NSAIDs) Take cyclobenzaprine at nighttime for symptomatic relief. Avoid driving or operating heavy machinery while using medication. Follow up with orthopedist for further evaluation and  management Return or go to the ER if you have any new or worsening symptoms (fever, chills, chest pain, abdominal pain, changes in bowel or bladder habits, pain radiating into lower legs, etc...)     Reviewed expectations re: course of current medical issues. Questions answered. Outlined signs and symptoms indicating need for more acute intervention. Patient verbalized understanding. After Visit Summary given.    Lestine Box, PA-C 03/06/20 [phone]

## 2020-03-07 ENCOUNTER — Encounter: Payer: Self-pay | Admitting: Orthopaedic Surgery

## 2020-03-07 ENCOUNTER — Ambulatory Visit: Payer: Medicaid Other | Admitting: Orthopaedic Surgery

## 2020-03-07 ENCOUNTER — Ambulatory Visit: Payer: Medicaid Other

## 2020-03-07 VITALS — BP 130/88 | HR 100 | Ht 59.0 in | Wt 184.0 lb

## 2020-03-07 DIAGNOSIS — M25562 Pain in left knee: Secondary | ICD-10-CM

## 2020-03-07 NOTE — Progress Notes (Addendum)
Subjective:    Patient ID: Tara Stark, female    DOB: [birth year], 39 y.o.   MRN: [id number]  HPI She fell thru a vent in the floor at her mother's house two days ago.  She hurt her left knee and lower back.  She was seen at Urgent Care.  No X-rays were done.  She was given knee sleeve and told to see [place].  She has pain medially of the left knee.  The knee gives way.  It is swollen.  She has pain.  Her lower back is sore but she has no weakness, no numbness.  She has multiple abrasions of the left lower knee and knee.   Review of Systems  Constitutional: Positive for activity change.  Musculoskeletal: Positive for arthralgias, back pain, gait problem and joint swelling.  All other systems reviewed and are negative.  For Review of Systems, all other systems reviewed and are negative.  The following is a summary of the past history medically, past history surgically, known current medicines, social history and family history.  This information is gathered electronically by the computer from prior information and documentation.  I review this each visit and have found including this information at this point in the chart is beneficial and informative.   Past Medical History:  Diagnosis Date  . Acid indigestion   . Fibroid   . Hemorrhoids   . Hypertension   . Thyroid disease   . Vitamin D deficiency disease 04/18/2019    Past Surgical History:  Procedure Laterality Date  . CESAREAN SECTION N/A 10/23/2019   Procedure: CESAREAN SECTION;  Surgeon: Osborne Oman, MD;  Location: [facility];  Service: Obstetrics;  Laterality: N/A;  Urgent Abruption  . NO PAST SURGERIES      Current Outpatient Medications on File Prior to Visit  Medication Sig Dispense Refill  . amLODipine (NORVASC) 10 MG tablet Take 1 tablet (10 mg total) by mouth daily. 30 tablet 4  . Cholecalciferol (VITAMIN D3) 250 MCG (10000 UT) TABS Take 10,000 Units by mouth daily.    . ferrous sulfate 325 (65 FE) MG tablet  Take 1 tablet (325 mg total) by mouth 2 (two) times daily with a meal. 60 tablet 1  . cyclobenzaprine (FLEXERIL) 10 MG tablet Take 1 tablet (10 mg total) by mouth at bedtime. (Patient not taking: Reported on 03/07/2020) 15 tablet 0  . meloxicam (MOBIC) 15 MG tablet Take 1 tablet (15 mg total) by mouth daily. (Patient not taking: Reported on 03/07/2020) 30 tablet 0   No current facility-administered medications on file prior to visit.    Social History   Socioeconomic History  . Marital status: Married    Spouse name: Ezabella Teska  . Number of children: 1  . Years of education: Not on file  . Highest education level: Associate degree: occupational, Hotel manager, or vocational program  Occupational History  . Not on file  Tobacco Use  . Smoking status: Former Smoker    Years: 4.00    Types: Cigarettes  . Smokeless tobacco: Never Used  . Tobacco comment: 2-3 cigarettes per day  Vaping Use  . Vaping Use: Never used  Substance and Sexual Activity  . Alcohol use: No  . Drug use: No  . Sexual activity: Yes    Birth control/protection: None  Other Topics Concern  . 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   Social Determinants of  Health   Financial Resource Strain:   . Difficulty of Paying Living Expenses:   Food Insecurity:   . Worried About Charity fundraiser in the Last Year:   . Arboriculturist in the Last Year:   Transportation Needs:   . Film/video editor (Medical):   Marland Kitchen Lack of Transportation (Non-Medical):   Physical Activity:   . Days of Exercise per Week:   . Minutes of Exercise per Session:   Stress:   . Feeling of Stress :   Social Connections:   . Frequency of Communication with Friends and Family:   . Frequency of Social Gatherings with Friends and Family:   . Attends Religious Services:   . Active Member of Clubs or Organizations:   . Attends Archivist Meetings:     Marland Kitchen Marital Status:   Intimate Partner Violence:   . Fear of Current or Ex-Partner:   . Emotionally Abused:   Marland Kitchen Physically Abused:   . Sexually Abused:     Family History  Problem Relation Age of Onset  . Other Paternal Grandfather        MVA  . Breast cancer Paternal Grandmother   . Alzheimer's disease Maternal Grandmother   . Cancer Maternal Grandfather        throat  . Diabetes Father   . Other Father        has a pacemaker  . Hypertension Mother   . Hypertension Brother   . Hypertension Sister   . Hypertension Sister   . Bronchiolitis Son   . Hypertension Paternal Aunt     BP 130/88   Pulse 100   Ht 4\' 11"  (1.499 m)   Wt 184 lb (83.5 kg)   BMI 37.16 kg/m   Body mass index is 37.16 kg/m.     Objective:   Physical Exam Vitals and nursing note reviewed.  Constitutional:      Appearance: She is well-developed.  HENT:     Head: Normocephalic and atraumatic.  Eyes:     Conjunctiva/sclera: Conjunctivae normal.     Pupils: Pupils are equal, round, and reactive to light.  Cardiovascular:     Rate and Rhythm: Normal rate and regular rhythm.  Pulmonary:     Effort: Pulmonary effort is normal.  Abdominal:     Palpations: Abdomen is soft.  Musculoskeletal:     Cervical back: Normal range of motion and neck supple.       Legs:  Skin:    General: Skin is warm and dry.  Neurological:     Mental Status: She is alert and oriented to person, place, and time.     Cranial Nerves: No cranial nerve deficit.     Motor: No abnormal muscle tone.     Coordination: Coordination normal.     Deep Tendon Reflexes: Reflexes are normal and symmetric. Reflexes normal.  Psychiatric:        Behavior: Behavior normal.        Thought Content: Thought content normal.        Judgment: Judgment normal.    X-rays were done of the left knee, reported separately.  PROCEDURE NOTE:  The patient requests injections of the left knee , verbal consent was obtained.  The left knee was  prepped appropriately after time out was performed.   Sterile technique was observed and injection of 1 cc of Depo-Medrol 40 mg with several cc's of plain xylocaine. Anesthesia was provided by ethyl chloride and a 20-gauge  needle was used to inject the knee area. The injection was tolerated well.  A band aid dressing was applied.  The patient was advised to apply ice later today and tomorrow to the injection sight as needed.      Assessment & Plan:   Encounter Diagnosis  Name Primary?  . Acute pain of left knee Yes   I am concerned about medial meniscus tear.  She was given Mobic in the Urgent care.  Continue this.  Continue the knee sleeve.  Use ice.  Return in three weeks.  She may need MRI.  Call if any problem.  Precautions discussed.   Electronically Signed Sanjuana Kava, MD 8/12/202110:06 AM

## 2020-03-22 ENCOUNTER — Other Ambulatory Visit: Payer: Self-pay | Admitting: Advanced Practice Midwife

## 2020-03-27 ENCOUNTER — Telehealth (INDEPENDENT_AMBULATORY_CARE_PROVIDER_SITE_OTHER): Payer: Self-pay | Admitting: Nurse Practitioner

## 2020-03-27 ENCOUNTER — Encounter (INDEPENDENT_AMBULATORY_CARE_PROVIDER_SITE_OTHER): Payer: Self-pay | Admitting: Nurse Practitioner

## 2020-03-27 ENCOUNTER — Ambulatory Visit (INDEPENDENT_AMBULATORY_CARE_PROVIDER_SITE_OTHER): Payer: Medicaid Other | Admitting: Nurse Practitioner

## 2020-03-27 ENCOUNTER — Other Ambulatory Visit: Payer: Self-pay

## 2020-03-27 VITALS — BP 140/80 | HR 91 | Temp 97.3°F | Ht 60.0 in | Wt 185.8 lb

## 2020-03-27 DIAGNOSIS — R002 Palpitations: Secondary | ICD-10-CM | POA: Diagnosis not present

## 2020-03-27 DIAGNOSIS — D509 Iron deficiency anemia, unspecified: Secondary | ICD-10-CM | POA: Diagnosis not present

## 2020-03-27 DIAGNOSIS — Z131 Encounter for screening for diabetes mellitus: Secondary | ICD-10-CM | POA: Diagnosis not present

## 2020-03-27 DIAGNOSIS — R011 Cardiac murmur, unspecified: Secondary | ICD-10-CM

## 2020-03-27 DIAGNOSIS — E559 Vitamin D deficiency, unspecified: Secondary | ICD-10-CM | POA: Diagnosis not present

## 2020-03-27 DIAGNOSIS — I10 Essential (primary) hypertension: Secondary | ICD-10-CM | POA: Diagnosis not present

## 2020-03-27 MED ORDER — AMLODIPINE BESYLATE 10 MG PO TABS: 10.0000 mg | ORAL_TABLET | Freq: Every day | ORAL | 0 refills | Status: DC

## 2020-03-27 NOTE — Patient Instructions (Signed)
Check your blood pressure at home daily for at least 7 days. Goal is less than 130/80.  [facility] Dietary Recommendations for Weight Loss What to Avoid  Avoid added sugars o Often added sugar can be found in processed foods such as many condiments, dry cereals, cakes, cookies, chips, crisps, crackers, candies, sweetened drinks, etc.  o Read labels and AVOID/DECREASE use of foods with the following in their ingredient list: Sugar, fructose, high fructose corn syrup, sucrose, glucose, maltose, dextrose, molasses, cane sugar, brown sugar, any type of syrup, agave nectar, etc.    Avoid snacking in between meals  Avoid foods made with flour o If you are going to eat food made with flour, choose those made with whole-grains; and, minimize your consumption as much as is tolerable  Avoid processed foods o These foods are generally stocked in the middle of the grocery store. Focus on shopping on the perimeter of the grocery.   Avoid Meat  o We recommend following a plant-based diet at [facility]. Thus, we recommend avoiding meat as a general rule. Consider eating beans, legumes, eggs, and/or dairy products for regular protein sources o If you plan on eating meat limit to 4 ounces of meat at a time and choose lean options such as Fish, chicken, [place]. Avoid red meat intake such as pork and/or steak What to Include  Vegetables o GREEN LEAFY VEGETABLES: Kale, spinach, mustard greens, collard greens, cabbage, broccoli, etc. o OTHER: Asparagus, cauliflower, eggplant, carrots, peas, Brussel sprouts, tomatoes, bell peppers, zucchini, beets, cucumbers, etc.  Grains, seeds, and legumes o Beans: kidney beans, black eyed peas, garbanzo beans, black beans, pinto beans, etc. o Whole, unrefined grains: brown rice, barley, bulgur, oatmeal, etc.  Healthy fats  o Avoid highly processed fats such as vegetable oil o Examples of healthy fats: avocado, olives, virgin olive oil, dark  chocolate (?72% Cocoa), nuts (peanuts, almonds, walnuts, cashews, pecans, etc.)  None to Low Intake of Animal Sources of Protein o Meat sources: chicken, [place], salmon, tuna. Limit to 4 ounces of meat at one time. o Consider limiting dairy sources, but when choosing dairy focus on: PLAIN [place] yogurt, cottage cheese, high-protein milk  Fruit o Choose berries  When to Eat  Intermittent Fasting: o Choosing not to eat for a specific time period, but DO FOCUS ON HYDRATION when fasting o Multiple Techniques: - Time Restricted Eating: eat 3 meals in a day, each meal lasting no more than 60 minutes, no snacks between meals - 16-18 hour fast: fast for 16 to 18 hours up to 7 days a week. Often suggested to start with 2-3 nonconsecutive days per week.   Remember the time you sleep is counted as fasting.   Examples of eating schedule: Fast from 7:00pm-11:00am. Eat between 11:00am-7:00pm.  - 24-hour fast: fast for 24 hours up to every other day. Often suggested to start with 1 day per week  Remember the time you sleep is counted as fasting  Examples of eating schedule:  o Eating day: eat 2-3 meals on your eating day. If doing 2 meals, each meal should last no more than 90 minutes. If doing 3 meals, each meal should last no more than 60 minutes. Finish last meal by 7:00pm. o Fasting day: Fast until 7:00pm.  o IF YOU FEEL UNWELL FOR ANY REASON/IN ANY WAY WHEN FASTING, STOP FASTING BY EATING A NUTRITIOUS SNACK OR LIGHT MEAL o ALWAYS FOCUS ON HYDRATION DURING FASTS - Acceptable Hydration sources: water, broths, tea/coffee (black  tea/coffee is best but using a small amount of whole-fat dairy products in coffee/tea is acceptable).  - Poor Hydration Sources: anything with sugar or artificial sweeteners added to it  These recommendations have been developed for patients that are actively receiving medical care from either Dr. Anastasio Champion or Jeralyn Ruths, DNP, NP-C at [facility]. These  recommendations are developed for patients with specific medical conditions and are not meant to be distributed or used by others that are not actively receiving care from either provider listed above at [facility]. It is not appropriate to participate in the above eating plans without proper medical supervision.   Reference: Rexanne Mano. The obesity code. [place]; 2016.

## 2020-03-27 NOTE — Telephone Encounter (Signed)
Tara Stark, just FYI I did order cardiac echocardiogram today during patient's office visit.  Please make sure that this is scheduled.  Thank you.

## 2020-03-27 NOTE — Progress Notes (Signed)
Subjective:  Patient ID: Tara Stark, female    DOB: [birth year]  Age: 39 y.o. MRN: [id number]  CC:  Chief Complaint  Patient presents with  . Hypertension  . Medication Refill      HPI  This patient arrives today for follow-up. She has a history of hypertension and takes amlodipine. She tells me she has been out of this medication for the last 2 days, and would like to see if she needs to keep taking the medicine. She also has a history of iron deficiency anemia and continues on iron supplement. She is wondering if she needs to take this supplement any longer. She has history of vitamin D deficiency and continues on her vitamin D3 supplement. During review of systems she did mention that she has been having some cardiac palpitations. Tells me she has had them in the past however they have been occurring more frequently over the last couple of weeks. She denies any shortness of breath or chest pain during episodes and tells me the episodes last for a few seconds and spontaneously resolved.  Past Medical History:  Diagnosis Date  . Acid indigestion   . Fibroid   . Hemorrhoids   . Hypertension   . Thyroid disease   . Vitamin D deficiency disease 04/18/2019      Family History  Problem Relation Age of Onset  . Other Paternal Grandfather        MVA  . Breast cancer Paternal Grandmother   . Alzheimer's disease Maternal Grandmother   . Cancer Maternal Grandfather        throat  . Diabetes Father   . Other Father        has a pacemaker  . Hypertension Mother   . Hypertension Brother   . Hypertension Sister   . Hypertension Sister   . Bronchiolitis Son   . 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   Social History   Tobacco Use  . Smoking status: Former Smoker    Years: 4.00    Types: Cigarettes  . Smokeless tobacco: Never Used  . Tobacco  comment: 2-3 cigarettes per day  Substance Use Topics  . Alcohol use: No     Current Meds  Medication Sig  . amLODipine (NORVASC) 10 MG tablet Take 1 tablet (10 mg total) by mouth daily.  . Cholecalciferol (VITAMIN D3) 250 MCG (10000 UT) TABS Take 10,000 Units by mouth daily.  . ferrous sulfate 325 (65 FE) MG tablet Take 1 tablet (325 mg total) by mouth 2 (two) times daily with a meal.    ROS:  Review of Systems  Constitutional: Negative for fever, malaise/fatigue and weight loss.  Eyes: Negative for blurred vision.  Respiratory: Negative for shortness of breath.   Cardiovascular: Positive for palpitations. Negative for chest pain.  Gastrointestinal: Negative for abdominal pain and blood in stool.  Psychiatric/Behavioral: Negative for depression. The patient is not nervous/anxious.      Objective:   Today's Vitals: BP 140/80 (BP Location: Right Arm, Patient Position: Sitting, Cuff Size: Normal)   Pulse 91   Temp (!) 97.3 F (36.3 C) (Temporal)   Ht 5' (1.524 m)   Wt 185 lb 12.8 oz (84.3 kg)   LMP 03/11/2020   SpO2 96%   BMI 36.29 kg/m  Vitals with BMI 03/27/2020 03/07/2020 03/06/2020  Height 5\' 0"  4\' 11"  5'  0"  Weight 185 lbs 13 oz 184 lbs 180 lbs  BMI 36.29 97.67 34.19  Systolic 379 024 097  Diastolic 80 88 85  Pulse 91 100 101     Physical Exam Vitals reviewed.  Constitutional:      General: She is not in acute distress.    Appearance: Normal appearance.  HENT:     Head: Normocephalic and atraumatic.  Neck:     Vascular: No carotid bruit.  Cardiovascular:     Rate and Rhythm: Normal rate and regular rhythm.     Pulses: Normal pulses.     Heart sounds: Murmur heard.   Pulmonary:     Effort: Pulmonary effort is normal.     Breath sounds: Normal breath sounds.  Skin:    General: Skin is warm and dry.  Neurological:     General: No focal deficit present.     Mental Status: She is alert and oriented to person, place, and time.  Psychiatric:        Mood and  Affect: Mood normal.        Behavior: Behavior normal.        Judgment: Judgment normal.     EKG: Normal sinus rhythm     Assessment and Plan   1. Palpitations   2. Iron deficiency anemia, unspecified iron deficiency anemia type   3. Essential hypertension, benign   4. Vitamin D deficiency disease   5. Screening for diabetes mellitus   6. Murmur      Plan: 1.  Palpitations are occurring randomly and spontaneously resolved without significant chest pain or shortness of breath.  However they are occurring more frequently and patient is concerned about this.  EKG today did not show any abnormal rhythms.  She would like to be evaluated by cardiology, so I will send referral for further evaluation of her cardiac palpitations.  2. We will check iron, ferritin, and CBC today for further evaluation. May consider stopping iron supplement based on these results.  3. She is interested in stopping her amlodipine. I told her to check her blood pressure at home every day for the next 7 days. If her blood pressure is mainly less than 140 over less than 80 she can stop her amlodipine. I told her if she sees 3 or more readings of 140 or greater over 90 or greater she needs to restart her amlodipine. I have sent a refill of amlodipine to her pharmacy just in case. We also discussed being physically active for at least 30 minutes most days of the week in addition to maintaining a healthy weight to help her control her blood pressure. We discussed healthy diet and I recommended she focus on a plant-based whole food diet. She tells me she will consider this.  4. She continues on her vitamin D3 supplement I will check serum level today for further evaluation.  5. We will check A1c for screening for diabetes as I do not see that this is been done in the recent past.  6.  Murmur noted on physical exam today.  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  I do not see where she  is had cardiac echocardiogram completed.  We will order this for further evaluation of her murmur today.   Tests ordered No orders of the defined types were placed in this encounter.     No orders of the defined types were placed in this encounter.  Patient to follow-up in 6 weeks or sooner as needed.  Ailene Ards, NP

## 2020-03-28 ENCOUNTER — Encounter: Payer: Self-pay | Admitting: Orthopaedic Surgery

## 2020-03-28 ENCOUNTER — Other Ambulatory Visit: Payer: Self-pay | Admitting: Orthopaedic Surgery

## 2020-03-28 ENCOUNTER — Ambulatory Visit (INDEPENDENT_AMBULATORY_CARE_PROVIDER_SITE_OTHER): Payer: Medicaid Other | Admitting: Orthopaedic Surgery

## 2020-03-28 VITALS — BP 120/75 | HR 82 | Ht 60.0 in | Wt 185.0 lb

## 2020-03-28 DIAGNOSIS — M25562 Pain in left knee: Secondary | ICD-10-CM

## 2020-03-28 LAB — CBC WITH DIFFERENTIAL/PLATELET
Absolute Monocytes: 719 cells/uL (ref 200–950)
Basophils Absolute: 18 cells/uL (ref 0–200)
Basophils Relative: 0.2 %
Eosinophils Absolute: 137 cells/uL (ref 15–500)
Eosinophils Relative: 1.5 %
HCT: 43.1 % (ref 35.0–45.0)
Hemoglobin: 13.7 g/dL (ref 11.7–15.5)
Lymphs Abs: 2730 cells/uL (ref 850–3900)
MCH: 26.2 pg — ABNORMAL LOW (ref 27.0–33.0)
MCHC: 31.8 g/dL — ABNORMAL LOW (ref 32.0–36.0)
MCV: 82.4 fL (ref 80.0–100.0)
MPV: 11.2 fL (ref 7.5–12.5)
Monocytes Relative: 7.9 %
Neutro Abs: 5496 cells/uL (ref 1500–7800)
Neutrophils Relative %: 60.4 %
Platelets: 241 10*3/uL (ref 140–400)
RBC: 5.23 10*6/uL — ABNORMAL HIGH (ref 3.80–5.10)
RDW: 15.5 % — ABNORMAL HIGH (ref 11.0–15.0)
Total Lymphocyte: 30 %
WBC: 9.1 10*3/uL (ref 3.8–10.8)

## 2020-03-28 LAB — COMPLETE METABOLIC PANEL WITH GFR
AG Ratio: 1.4 (calc) (ref 1.0–2.5)
ALT: 19 U/L (ref 6–29)
AST: 13 U/L (ref 10–30)
Albumin: 4.1 g/dL (ref 3.6–5.1)
Alkaline phosphatase (APISO): 49 U/L (ref 31–125)
BUN/Creatinine Ratio: 24 (calc) — ABNORMAL HIGH (ref 6–22)
BUN: 12 mg/dL (ref 7–25)
CO2: 33 mmol/L — ABNORMAL HIGH (ref 20–32)
Calcium: 9.4 mg/dL (ref 8.6–10.2)
Chloride: 103 mmol/L (ref 98–110)
Creat: 0.49 mg/dL — ABNORMAL LOW (ref 0.50–1.10)
GFR, Est African American: 143 mL/min/{1.73_m2} (ref >=60)
GFR, Est Non African American: 124 mL/min/{1.73_m2} (ref >=60)
Globulin: 2.9 g/dL (calc) (ref 1.9–3.7)
Glucose, Bld: 84 mg/dL (ref 65–99)
Potassium: 4.9 mmol/L (ref 3.5–5.3)
Sodium: 141 mmol/L (ref 135–146)
Total Bilirubin: 0.3 mg/dL (ref 0.2–1.2)
Total Protein: 7 g/dL (ref 6.1–8.1)

## 2020-03-28 LAB — HEMOGLOBIN A1C
Hgb A1c MFr Bld: 6 % of total Hgb — ABNORMAL HIGH (ref <5.7)
Mean Plasma Glucose: 126 (calc)
eAG (mmol/L): 7 (calc)

## 2020-03-28 LAB — IRON: Iron: 95 ug/dL (ref 40–190)

## 2020-03-28 LAB — T4, FREE: Free T4: 1 ng/dL (ref 0.8–1.8)

## 2020-03-28 LAB — TSH: TSH: 0.6 mIU/L

## 2020-03-28 LAB — T3, FREE: T3, Free: 3 pg/mL (ref 2.3–4.2)

## 2020-03-28 LAB — FERRITIN: Ferritin: 14 ng/mL — ABNORMAL LOW (ref 16–154)

## 2020-03-28 LAB — VITAMIN D 25 HYDROXY (VIT D DEFICIENCY, FRACTURES): Vit D, 25-Hydroxy: 57 ng/mL (ref 30–100)

## 2020-03-28 NOTE — Progress Notes (Signed)
Patient Tara Stark, female DOB:[birth year], 39 y.o. HGD:[id number]  Chief Complaint  Patient presents with  . Knee Pain    left /better but still having problems with it     HPI  Tara Stark is a 39 y.o. female who has continual pain of the left knee.  She has giving way now and more swelling.  I will get MRI of the knee as I am concerned about meniscus tear and need for arthroscopy.   Body mass index is 36.13 kg/m.  ROS  Review of Systems  Constitutional: Positive for activity change.  Musculoskeletal: Positive for arthralgias, back pain, gait problem and joint swelling.  All other systems reviewed and are negative.   All other systems reviewed and are negative.  The following is a summary of the past history medically, past history surgically, known current medicines, social history and family history.  This information is gathered electronically by the computer from prior information and documentation.  I review this each visit and have found including this information at this point in the chart is beneficial and informative.    Past Medical History:  Diagnosis Date  . Acid indigestion   . Fibroid   . Hemorrhoids   . Hypertension   . Thyroid disease   . Vitamin D deficiency disease 04/18/2019    Past Surgical History:  Procedure Laterality Date  . CESAREAN SECTION N/A 10/23/2019   Procedure: CESAREAN SECTION;  Surgeon: Osborne Oman, MD;  Location: [facility];  Service: Obstetrics;  Laterality: N/A;  Urgent Abruption  . NO PAST SURGERIES    . TUBAL LIGATION      Family History  Problem Relation Age of Onset  . Other Paternal Grandfather        MVA  . Breast cancer Paternal Grandmother   . Alzheimer's disease Maternal Grandmother   . Cancer Maternal Grandfather        throat  . Diabetes Father   . Other Father        has a pacemaker  . Hypertension Mother   . Hypertension Brother   . Hypertension Sister   . Hypertension Sister   .  Bronchiolitis Son   . Hypertension Paternal Aunt     Social History Social History   Tobacco Use  . Smoking status: Former Smoker    Years: 4.00    Types: Cigarettes  . Smokeless tobacco: Never Used  . Tobacco comment: 2-3 cigarettes per day  Vaping Use  . Vaping Use: Never used  Substance Use Topics  . Alcohol use: No  . Drug use: No    No Known Allergies  Current Outpatient Medications  Medication Sig Dispense Refill  . amLODipine (NORVASC) 10 MG tablet Take 1 tablet (10 mg total) by mouth daily. 90 tablet 0  . Cholecalciferol (VITAMIN D3) 250 MCG (10000 UT) TABS Take 10,000 Units by mouth daily.    . ferrous sulfate 325 (65 FE) MG tablet Take 1 tablet (325 mg total) by mouth 2 (two) times daily with a meal. 60 tablet 1   No current facility-administered medications for this visit.     Physical Exam  Blood pressure 120/75, pulse 82, height 5' (1.524 m), weight 185 lb (83.9 kg), last menstrual period 03/11/2020, unknown if currently breastfeeding.  Constitutional: overall normal hygiene, normal nutrition, well developed, normal grooming, normal body habitus. Assistive device:none  Musculoskeletal: gait and station Limp left, muscle tone and strength are normal, no tremors or atrophy is present.  Marland Kitchen  Neurological: coordination overall normal.  Deep tendon reflex/nerve stretch intact.  Sensation normal.  Cranial nerves II-XII intact.   Skin:   Normal overall no scars, lesions, ulcers or rashes. No psoriasis.  Psychiatric: Alert and oriented x 3.  Recent memory intact, remote memory unclear.  Normal mood and affect. Well groomed.  Good eye contact.  Cardiovascular: overall no swelling, no varicosities, no edema bilaterally, normal temperatures of the legs and arms, no clubbing, cyanosis and good capillary refill.  Lymphatic: palpation is normal.  Left knee with effusion, crepitus, ROM 0 to 110, limp left, positive medial [place].    All other systems reviewed and  are negative   The patient has been educated about the nature of the problem(s) and counseled on treatment options.  The patient appeared to understand what I have discussed and is in agreement with it.  Encounter Diagnosis  Name Primary?  . Acute pain of left knee Yes    PLAN Call if any problems.  Precautions discussed.  Continue current medications.   Return to clinic 2 weeks   Get MRI of the left knee.  Electronically Signed Sanjuana Kava, MD 9/2/202111:31 AM

## 2020-04-02 NOTE — Telephone Encounter (Signed)
Order has been placed.

## 2020-04-03 ENCOUNTER — Encounter (HOSPITAL_COMMUNITY): Payer: Self-pay | Admitting: *Deleted

## 2020-04-03 ENCOUNTER — Other Ambulatory Visit: Payer: Self-pay

## 2020-04-03 DIAGNOSIS — R002 Palpitations: Secondary | ICD-10-CM | POA: Insufficient documentation

## 2020-04-03 DIAGNOSIS — Z5321 Procedure and treatment not carried out due to patient leaving prior to being seen by health care provider: Secondary | ICD-10-CM | POA: Diagnosis not present

## 2020-04-03 NOTE — ED Triage Notes (Signed)
Pt with palpitations currently and earlier.  Pt scheduled to have an EKG for tomorrow. Pt denies any pain at present.

## 2020-04-04 ENCOUNTER — Emergency Department (HOSPITAL_COMMUNITY)
Admission: EM | Admit: 2020-04-04 | Discharge: 2020-04-04 | Disposition: A | Discharge status: Home / Self Care | Payer: Medicaid Other | Attending: Emergency Medicine | Admitting: Emergency Medicine

## 2020-04-04 ENCOUNTER — Ambulatory Visit (HOSPITAL_COMMUNITY): Admission: RE | Admit: 2020-04-04 | Payer: Medicaid Other | Source: Ambulatory Visit

## 2020-04-04 NOTE — ED Notes (Signed)
Pt called X 3 no answer.

## 2020-04-05 ENCOUNTER — Telehealth: Payer: Self-pay | Admitting: *Deleted

## 2020-04-05 NOTE — Telephone Encounter (Signed)
Tara Stark presented to the ED and left before being seen by the provider on 04/04/20. The patient has been enrolled in an automated general discharge outreach program and 2 attempts to contact the patient will be made to follow up on their ED visit and subsequent needs. The care management team is available to provide assistance to this patient at any time.   [name], RN, BSN, [facility] [phone]

## 2020-04-11 ENCOUNTER — Other Ambulatory Visit: Payer: Medicaid Other

## 2020-04-11 ENCOUNTER — Telehealth: Payer: Self-pay | Admitting: Orthopaedic Surgery

## 2020-04-11 NOTE — Telephone Encounter (Signed)
Patient has been called and a message has been left for her to call back and reschedule.   MRI has been scheduled for 04/23/20 at 8:00 am.

## 2020-04-16 ENCOUNTER — Ambulatory Visit: Payer: Medicaid Other | Admitting: Orthopaedic Surgery

## 2020-04-22 ENCOUNTER — Ambulatory Visit (HOSPITAL_COMMUNITY): Admission: RE | Admit: 2020-04-22 | Payer: Medicaid Other | Source: Ambulatory Visit

## 2020-04-23 ENCOUNTER — Ambulatory Visit (HOSPITAL_COMMUNITY): Payer: Medicaid Other

## 2020-04-29 ENCOUNTER — Ambulatory Visit (HOSPITAL_COMMUNITY)
Admission: RE | Admit: 2020-04-29 | Discharge: 2020-04-29 | Disposition: A | Discharge status: Home / Self Care | Payer: Medicaid Other | Source: Ambulatory Visit | Attending: Nurse Practitioner | Admitting: Nurse Practitioner

## 2020-04-29 ENCOUNTER — Other Ambulatory Visit: Payer: Self-pay

## 2020-04-29 DIAGNOSIS — R011 Cardiac murmur, unspecified: Secondary | ICD-10-CM | POA: Insufficient documentation

## 2020-04-29 LAB — ECHOCARDIOGRAM COMPLETE
Area-P 1/2: 2.73 cm2
S' Lateral: 2.07 cm

## 2020-04-29 NOTE — Progress Notes (Signed)
*  PRELIMINARY RESULTS* Echocardiogram 2D Echocardiogram has been performed.  Tara Stark 04/29/2020, 3:50 PM

## 2020-05-03 ENCOUNTER — Other Ambulatory Visit: Payer: Self-pay

## 2020-05-03 ENCOUNTER — Encounter (HOSPITAL_COMMUNITY): Payer: Self-pay | Admitting: Emergency Medicine

## 2020-05-03 DIAGNOSIS — Z87891 Personal history of nicotine dependence: Secondary | ICD-10-CM | POA: Diagnosis not present

## 2020-05-03 DIAGNOSIS — I10 Essential (primary) hypertension: Secondary | ICD-10-CM | POA: Diagnosis not present

## 2020-05-03 DIAGNOSIS — R42 Dizziness and giddiness: Secondary | ICD-10-CM | POA: Diagnosis not present

## 2020-05-03 DIAGNOSIS — R002 Palpitations: Secondary | ICD-10-CM | POA: Insufficient documentation

## 2020-05-03 LAB — CBC
HCT: 27 % — ABNORMAL LOW (ref 36.0–46.0)
Hemoglobin: 8.3 g/dL — ABNORMAL LOW (ref 12.0–15.0)
MCH: 25.1 pg — ABNORMAL LOW (ref 26.0–34.0)
MCHC: 30.7 g/dL (ref 30.0–36.0)
MCV: 81.6 fL (ref 80.0–100.0)
Platelets: 210 10*3/uL (ref 150–400)
RBC: 3.31 MIL/uL — ABNORMAL LOW (ref 3.87–5.11)
RDW: 21.5 % — ABNORMAL HIGH (ref 11.5–15.5)
WBC: 2 10*3/uL — ABNORMAL LOW (ref 4.0–10.5)
nRBC: 1 % — ABNORMAL HIGH (ref 0.0–0.2)

## 2020-05-03 LAB — BASIC METABOLIC PANEL
Anion gap: 12 (ref 5–15)
BUN: 48 mg/dL — ABNORMAL HIGH (ref 6–20)
CO2: 19 mmol/L — ABNORMAL LOW (ref 22–32)
Calcium: 8.7 mg/dL — ABNORMAL LOW (ref 8.9–10.3)
Chloride: 104 mmol/L (ref 98–111)
Creatinine, Ser: 3.64 mg/dL — ABNORMAL HIGH (ref 0.44–1.00)
GFR, Estimated: 15 mL/min — ABNORMAL LOW (ref >60)
Glucose, Bld: 202 mg/dL — ABNORMAL HIGH (ref 70–99)
Potassium: 4.2 mmol/L (ref 3.5–5.1)
Sodium: 135 mmol/L (ref 135–145)

## 2020-05-03 LAB — URINALYSIS, ROUTINE W REFLEX MICROSCOPIC
Bilirubin Urine: NEGATIVE
Glucose, UA: NEGATIVE mg/dL
Hgb urine dipstick: NEGATIVE
Ketones, ur: NEGATIVE mg/dL
Leukocytes,Ua: NEGATIVE
Nitrite: NEGATIVE
Protein, ur: NEGATIVE mg/dL
Specific Gravity, Urine: 1.013 (ref 1.005–1.030)
pH: 7 (ref 5.0–8.0)

## 2020-05-03 LAB — TROPONIN I (HIGH SENSITIVITY): Troponin I (High Sensitivity): 18 ng/L — ABNORMAL HIGH (ref <18)

## 2020-05-03 NOTE — ED Triage Notes (Signed)
Patient states heart palpitations and dizziness. Patient was seen about a month ago at her PCP for the same and states that her EKG was normal.

## 2020-05-04 ENCOUNTER — Emergency Department (HOSPITAL_COMMUNITY)
Admission: EM | Admit: 2020-05-04 | Discharge: 2020-05-04 | Disposition: A | Discharge status: Home / Self Care | Payer: Medicaid Other | Attending: Emergency Medicine | Admitting: Emergency Medicine

## 2020-05-04 DIAGNOSIS — R002 Palpitations: Secondary | ICD-10-CM

## 2020-05-04 LAB — CBC WITH DIFFERENTIAL/PLATELET
Abs Immature Granulocytes: 0.03 10*3/uL (ref 0.00–0.07)
Basophils Absolute: 0 10*3/uL (ref 0.0–0.1)
Basophils Relative: 0 %
Eosinophils Absolute: 0.2 10*3/uL (ref 0.0–0.5)
Eosinophils Relative: 2 %
HCT: 41.2 % (ref 36.0–46.0)
Hemoglobin: 13.4 g/dL (ref 12.0–15.0)
Immature Granulocytes: 0 %
Lymphocytes Relative: 32 %
Lymphs Abs: 3.2 10*3/uL (ref 0.7–4.0)
MCH: 27.1 pg (ref 26.0–34.0)
MCHC: 32.5 g/dL (ref 30.0–36.0)
MCV: 83.4 fL (ref 80.0–100.0)
Monocytes Absolute: 0.8 10*3/uL (ref 0.1–1.0)
Monocytes Relative: 8 %
Neutro Abs: 5.8 10*3/uL (ref 1.7–7.7)
Neutrophils Relative %: 58 %
Platelets: 225 10*3/uL (ref 150–400)
RBC: 4.94 MIL/uL (ref 3.87–5.11)
RDW: 16.3 % — ABNORMAL HIGH (ref 11.5–15.5)
WBC: 10 10*3/uL (ref 4.0–10.5)
nRBC: 0 % (ref 0.0–0.2)

## 2020-05-04 LAB — BASIC METABOLIC PANEL
Anion gap: 7 (ref 5–15)
BUN: 13 mg/dL (ref 6–20)
CO2: 26 mmol/L (ref 22–32)
Calcium: 8.9 mg/dL (ref 8.9–10.3)
Chloride: 104 mmol/L (ref 98–111)
Creatinine, Ser: 0.41 mg/dL — ABNORMAL LOW (ref 0.44–1.00)
GFR, Estimated: >60 mL/min (ref >60)
Glucose, Bld: 99 mg/dL (ref 70–99)
Potassium: 3.6 mmol/L (ref 3.5–5.1)
Sodium: 137 mmol/L (ref 135–145)

## 2020-05-04 LAB — TROPONIN I (HIGH SENSITIVITY): Troponin I (High Sensitivity): <2 ng/L (ref <18)

## 2020-05-04 NOTE — Discharge Instructions (Addendum)
You were evaluated in the Emergency Department and after careful evaluation, we did not find any emergent condition requiring admission or further testing in the hospital.  Your exam/testing today was overall reassuring. We recommend follow-up with your primary care doctor if your symptoms continue.  Please return to the Emergency Department if you experience any worsening of your condition.  Thank you for allowing [place] to be a part of your care.

## 2020-05-04 NOTE — ED Provider Notes (Signed)
[facility] Emergency Department Provider Note MRN:  [id number]  Arrival date & time: 05/04/20     Chief Complaint   Palpitations   History of Present Illness   Tara Stark is a 39 y.o. year-old female with a history of hypertension presenting to the ED with chief complaint of palpitation.  1 months of palpitations, becoming more frequent, today experienced some lightheadedness with the palpitations and so here for evaluation.  Denies chest pain or shortness of breath, no abdominal pain, no headache or vision change, no numbness or weakness to the arms or legs, no cough, no fever, no dysuria.  Review of Systems  A complete 10 system review of systems was obtained and all systems are negative except as noted in the HPI and PMH.   Patient's Health History    Past Medical History:  Diagnosis Date  . Acid indigestion   . Fibroid   . Hemorrhoids   . Hypertension   . Thyroid disease   . Vitamin D deficiency disease 04/18/2019    Past Surgical History:  Procedure Laterality Date  . CESAREAN SECTION N/A 10/23/2019   Procedure: CESAREAN SECTION;  Surgeon: Osborne Oman, MD;  Location: [facility] LD [facility]S;  Service: Obstetrics;  Laterality: N/A;  Urgent Abruption  . NO PAST SURGERIES    . TUBAL LIGATION      Family History  Problem Relation Age of Onset  . Other Paternal Grandfather        MVA  . Breast cancer Paternal Grandmother   . Alzheimer's disease Maternal Grandmother   . Cancer Maternal Grandfather        throat  . Diabetes Father   . Other Father        has a pacemaker  . Hypertension Mother   . Hypertension Brother   . Hypertension Sister   . Hypertension Sister   . Bronchiolitis Son   . Hypertension Paternal Aunt     Social History   Socioeconomic History  . Marital status: Married    Spouse name: Joice Nazario  . Number of children: 1  . Years of education: Not on file  . Highest education level: Associate degree: occupational,  Hotel manager, or vocational program  Occupational History  . Not on file  Tobacco Use  . Smoking status: Former Smoker    Years: 4.00    Types: Cigarettes  . Smokeless tobacco: Never Used  . Tobacco comment: 2-3 cigarettes per day  Vaping Use  . Vaping Use: Never used  Substance and Sexual Activity  . Alcohol use: No  . Drug use: No  . Sexual activity: Yes    Birth control/protection: None  Other Topics Concern  . 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   Social Determinants of Health   Financial Resource Strain:   . Difficulty of Paying Living Expenses: Not on file  Food Insecurity:   . Worried About Charity fundraiser in the Last Year: Not on file  . Ran Out of Food in the Last Year: Not on file  Transportation Needs:   . Lack of Transportation (Medical): Not on file  . Lack of Transportation (Non-Medical): Not on file  Physical Activity:   . Days of Exercise per Week: Not on file  . Minutes of Exercise per Session: Not on file  Stress:   . Feeling of Stress : Not on file  Social Connections:   .  Frequency of Communication with Friends and Family: Not on file  . Frequency of Social Gatherings with Friends and Family: Not on file  . Attends Religious Services: Not on file  . Active Member of Clubs or Organizations: Not on file  . Attends Archivist Meetings: Not on file  . Marital Status: Not on file  Intimate Partner Violence:   . Fear of Current or Ex-Partner: Not on file  . Emotionally Abused: Not on file  . Physically Abused: Not on file  . Sexually Abused: Not on file     Physical Exam   Vitals:   05/03/20 2202 05/04/20 0215  BP: (!) 132/95 118/81  Pulse: 83 83  Resp: 18 20  Temp: 98.5 F (36.9 C)   SpO2: 100% 100%    CONSTITUTIONAL: Well-appearing, NAD NEURO:  Alert and oriented x 3, no focal deficits EYES:  eyes equal and reactive ENT/NECK:  no LAD, no  JVD CARDIO: Regular rate, well-perfused, normal S1 and S2 PULM:  CTAB no wheezing or rhonchi GI/GU:  normal bowel sounds, non-distended, non-tender MSK/SPINE:  No gross deformities, no edema SKIN:  no rash, atraumatic PSYCH:  Appropriate speech and behavior  *Additional and/or pertinent findings included in MDM below  Diagnostic and Interventional Summary    EKG Interpretation  Date/Time:   05/04/2020 @ 02:15:05 Ventricular Rate:   72 PR Interval:  166  QRS Duration:  72 QT Interval:   391 QTC Calculation:  428 R Axis:     Text Interpretation:  SR normal intervals no concerning features no arrhythmia Confirmed by Dr. Gerlene Fee at 02:19 AM     Labs Reviewed  CBC - Abnormal; Notable for the following components:      Result Value   WBC 2.0 (*)    RBC 3.31 (*)    Hemoglobin 8.3 (*)    HCT 27.0 (*)    MCH 25.1 (*)    RDW 21.5 (*)    nRBC 1.0 (*)    All other components within normal limits  BASIC METABOLIC PANEL - Abnormal; Notable for the following components:   CO2 19 (*)    Glucose, Bld 202 (*)    BUN 48 (*)    Creatinine, Ser 3.64 (*)    Calcium 8.7 (*)    GFR, Estimated 15 (*)    All other components within normal limits  URINALYSIS, ROUTINE W REFLEX MICROSCOPIC - Abnormal; Notable for the following components:   APPearance CLOUDY (*)    All other components within normal limits  BASIC METABOLIC PANEL - Abnormal; Notable for the following components:   Creatinine, Ser 0.41 (*)    All other components within normal limits  CBC WITH DIFFERENTIAL/PLATELET - Abnormal; Notable for the following components:   RDW 16.3 (*)    All other components within normal limits  TROPONIN I (HIGH SENSITIVITY) - Abnormal; Notable for the following components:   Troponin I (High Sensitivity) 18 (*)    All other components within normal limits  TROPONIN I (HIGH SENSITIVITY)    No orders to display    Medications - No data to display   Procedures  /  Critical  Care Procedures  ED Course and Medical Decision Making  I have reviewed the triage vital signs, the nursing notes, and pertinent available records from the EMR.  Listed above are laboratory and imaging tests that I personally ordered, reviewed, and interpreted and then considered in my medical decision making (see below for details).  We will  obtain EKG and 2.  Of cardiac monitoring here in the emergency department to exclude any evidence of arrhythmia.  No chest pain or shortness of breath, no leg pain or swelling, doubt ACS or PE.  Overall very well-appearing, normal neurological exam, doubt emergent process.  With negative work-up will be appropriate for PCP follow-up.       Barth Kirks. Sedonia Small, [place] [email or web address]  Final Clinical Impressions(s) / ED Diagnoses     ICD-10-CM   1. Palpitations  R00.2     ED Discharge Orders    None       Discharge Instructions Discussed with and Provided to Patient:     Discharge Instructions     You were evaluated in the Emergency Department and after careful evaluation, we did not find any emergent condition requiring admission or further testing in the hospital.  Your exam/testing today was overall reassuring. We recommend follow-up with your primary care doctor if your symptoms continue.  Please return to the Emergency Department if you experience any worsening of your condition.  Thank you for allowing [place] to be a part of your care.        Maudie Flakes, MD 05/04/20 [phone]

## 2020-05-06 ENCOUNTER — Telehealth: Payer: Self-pay

## 2020-05-06 NOTE — Telephone Encounter (Signed)
Transition Care Management Unsuccessful Follow-up Telephone Call  Date of discharge and from where:  10/09 from [facility] ED.  Attempts:  1st Attempt  Reason for unsuccessful TCM follow-up call:  Left voice message

## 2020-05-07 NOTE — Telephone Encounter (Signed)
Transition Care Management Unsuccessful Follow-up Telephone Call  Date of discharge and from where:  05/04/2020 from [facility]  Attempts:  2nd Attempt  Reason for unsuccessful TCM follow-up call:  Left voice message   .

## 2020-05-08 ENCOUNTER — Ambulatory Visit (INDEPENDENT_AMBULATORY_CARE_PROVIDER_SITE_OTHER): Payer: Medicaid Other | Admitting: Internal Medicine

## 2020-05-08 ENCOUNTER — Encounter (INDEPENDENT_AMBULATORY_CARE_PROVIDER_SITE_OTHER): Payer: Self-pay | Admitting: Internal Medicine

## 2020-05-08 ENCOUNTER — Other Ambulatory Visit: Payer: Self-pay

## 2020-05-08 VITALS — BP 133/88 | HR 87 | Temp 97.7°F | Resp 18 | Ht 60.0 in | Wt 192.0 lb

## 2020-05-08 DIAGNOSIS — R002 Palpitations: Secondary | ICD-10-CM | POA: Diagnosis not present

## 2020-05-08 DIAGNOSIS — M542 Cervicalgia: Secondary | ICD-10-CM | POA: Diagnosis not present

## 2020-05-08 DIAGNOSIS — I10 Essential (primary) hypertension: Secondary | ICD-10-CM

## 2020-05-08 DIAGNOSIS — D649 Anemia, unspecified: Secondary | ICD-10-CM | POA: Diagnosis not present

## 2020-05-08 DIAGNOSIS — R270 Ataxia, unspecified: Secondary | ICD-10-CM | POA: Diagnosis not present

## 2020-05-08 NOTE — Telephone Encounter (Signed)
Transition Care Management Follow-up Telephone Call  Date of discharge and from where: 05/04/2020 from [facility]  How have you been since you were released from the hospital? Patient is still having the palpitations and feeling hot.   Any questions or concerns? No  Items Reviewed:  Did the pt receive and understand the discharge instructions provided? Yes   Medications obtained and verified? Yes   Any new allergies since your discharge? Yes   Dietary orders reviewed? Yes  Do you have support at home? Yes   Functional Questionnaire: (I = Independent and D = Dependent) ADLs: I  Bathing/Dressing- I  Meal Prep- I  Eating- I  Maintaining continence- I  Transferring/Ambulation- I  Managing Meds- I  Follow up appointments reviewed:   PCP Hospital f/u appt confirmed? Yes  Scheduled to see Dr. Anastasio Champion on 05/08/2020 @ 5:30pm.  Are transportation arrangements needed? No   If their condition worsens, is the pt aware to call PCP or go to the Emergency Dept.? Yes  Was the patient provided with contact information for the PCP's office or ED? Yes  Was to pt encouraged to call back with questions or concerns? Yes

## 2020-05-08 NOTE — Progress Notes (Signed)
Metrics: Intervention Frequency ACO  Documented Smoking Status Yearly  Screened one or more times in 24 months  Cessation Counseling or  Active cessation medication Past 24 months  Past 24 months   Guideline developer: UpToDate (See UpToDate for funding source) Date Released: 2014       Wellness Office Visit  Subjective:  Patient ID: Tara Stark, female    DOB: [birth year]  Age: 39 y.o. MRN: [id number]  CC: This lady comes in post emergency room visit.  She is complaining of daily palpitations. HPI  The palpitations typically last 5 to 10 minutes.  She feels lightheaded and dizzy with them. She also describes some neck pain which she is actually had for the last year but more recently a feeling of unsteadiness on her feet/ataxia.  She cannot be very specific about this. When she went to the emergency room, blood work was unremarkable but I noticed that there were conflicting reports regarding the patient's hemoglobin.  One was completely normal and the other one was hemoglobin of 8.3. Past Medical History:  Diagnosis Date  . Acid indigestion   . Fibroid   . Hemorrhoids   . Hypertension   . Thyroid disease   . Vitamin D deficiency disease 04/18/2019   Past Surgical History:  Procedure Laterality Date  . CESAREAN SECTION N/A 10/23/2019   Procedure: CESAREAN SECTION;  Surgeon: Osborne Oman, MD;  Location: [facility] LD ORS;  Service: Obstetrics;  Laterality: N/A;  Urgent Abruption  . NO PAST SURGERIES    . TUBAL LIGATION       Family History  Problem Relation Age of Onset  . Other Paternal Grandfather        MVA  . Breast cancer Paternal Grandmother   . Alzheimer's disease Maternal Grandmother   . Cancer Maternal Grandfather        throat  . Diabetes Father   . Other Father        has a pacemaker  . Hypertension Mother   . Hypertension Brother   . Hypertension Sister   . Hypertension Sister   . Bronchiolitis Son   . 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   Social History   Tobacco Use  . Smoking status: Former Smoker    Years: 4.00    Types: Cigarettes  . Smokeless tobacco: Never Used  . Tobacco comment: 2-3 cigarettes per day  Substance Use Topics  . Alcohol use: No    Current Meds  Medication Sig  . amLODipine (NORVASC) 10 MG tablet Take 1 tablet (10 mg total) by mouth daily.  . Cholecalciferol (VITAMIN D3) 250 MCG (10000 UT) TABS Take 10,000 Units by mouth daily.  . ferrous sulfate 325 (65 FE) MG tablet Take 1 tablet (325 mg total) by mouth 2 (two) times daily with a meal.      Depression screen [facility] 2/9 05/17/2019 03/17/2018 03/09/2018  Decreased Interest 0 0 0  Down, Depressed, Hopeless 0 0 0  PHQ - 2 Score 0 0 0  Altered sleeping 0 0 -  Tired, decreased energy 0 0 -  Change in appetite 0 1 -  Feeling bad or failure about yourself  0 0 -  Trouble concentrating 0 0 -  Moving slowly or fidgety/restless 0 0 -  Suicidal thoughts 0 0 -  PHQ-9 Score 0 1 -  Difficult doing work/chores - Somewhat difficult -  Objective:   Today's Vitals: BP 133/88 (BP Location: Left Arm, Patient Position: Sitting)   Pulse 87   Temp 97.7 F (36.5 C) (Temporal)   Resp 18   Ht 5' (1.524 m)   Wt 192 lb (87.1 kg)   LMP 04/08/2020   SpO2 95%   BMI 37.50 kg/m  Vitals with BMI 05/08/2020 05/04/2020 05/03/2020  Height 5\' 0"  - 4\' 11"   Weight 192 lbs - 188 lbs  BMI 89.8 - 42.10  Systolic 312 811 886  Diastolic 88 81 95  Pulse 87 83 83     Physical Exam  She does not appear to be in any distress.  She is obese.  Blood pressure slightly elevated today.  She is alert and orientated without any focal neuro signs.  Examination of her neck shows good movements on all sides but she appears to have muscular tenderness in the sternocleidomastoid muscle.     Assessment   1. Neck pain   2. Essential hypertension, benign   3.  Palpitations   4. Anemia, unspecified type   5. Ataxia       Tests ordered Orders Placed This Encounter  Procedures  . CBC  . COMPLETE METABOLIC PANEL WITH GFR  . Ambulatory referral to Cardiology  . Ambulatory referral to Neurology     Plan: 1. As far as the neck pain and ataxia is concerned, they may or may not be related.  I will ask neurology for an evaluation. 2. The palpitations may actually represent an arrhythmia.  Emergency room visit did not show any abnormalities on the ECG but she may not be having symptoms at the time.  I will refer to cardiology, I suspect she will need a Holter monitor. 3. I will repeat the CBC and complete metabolic panel to be sure that she is not anemic and electrolytes are in good range. 4. Follow-up in about a month's time.  Today I have encouraged to make sure she gets COVID-19 vaccination.   No orders of the defined types were placed in this encounter.   Doree Albee, MD

## 2020-05-09 LAB — COMPLETE METABOLIC PANEL WITH GFR
AG Ratio: 1.4 (calc) (ref 1.0–2.5)
ALT: 20 U/L (ref 6–29)
AST: 15 U/L (ref 10–30)
Albumin: 4.2 g/dL (ref 3.6–5.1)
Alkaline phosphatase (APISO): 42 U/L (ref 31–125)
BUN/Creatinine Ratio: 27 (calc) — ABNORMAL HIGH (ref 6–22)
BUN: 11 mg/dL (ref 7–25)
CO2: 27 mmol/L (ref 20–32)
Calcium: 8.8 mg/dL (ref 8.6–10.2)
Chloride: 104 mmol/L (ref 98–110)
Creat: 0.41 mg/dL — ABNORMAL LOW (ref 0.50–1.10)
GFR, Est African American: 151 mL/min/{1.73_m2} (ref >=60)
GFR, Est Non African American: 130 mL/min/{1.73_m2} (ref >=60)
Globulin: 3 g/dL (calc) (ref 1.9–3.7)
Glucose, Bld: 103 mg/dL — ABNORMAL HIGH (ref 65–99)
Potassium: 4.3 mmol/L (ref 3.5–5.3)
Sodium: 140 mmol/L (ref 135–146)
Total Bilirubin: 0.2 mg/dL (ref 0.2–1.2)
Total Protein: 7.2 g/dL (ref 6.1–8.1)

## 2020-05-09 LAB — CBC
HCT: 42.2 % (ref 35.0–45.0)
Hemoglobin: 13.7 g/dL (ref 11.7–15.5)
MCH: 27.1 pg (ref 27.0–33.0)
MCHC: 32.5 g/dL (ref 32.0–36.0)
MCV: 83.4 fL (ref 80.0–100.0)
MPV: 10.8 fL (ref 7.5–12.5)
Platelets: 226 10*3/uL (ref 140–400)
RBC: 5.06 10*6/uL (ref 3.80–5.10)
RDW: 15.1 % — ABNORMAL HIGH (ref 11.0–15.0)
WBC: 9 10*3/uL (ref 3.8–10.8)

## 2020-05-15 ENCOUNTER — Encounter: Payer: Self-pay | Admitting: Diagnostic Neuroimaging

## 2020-05-15 ENCOUNTER — Ambulatory Visit: Payer: Medicaid Other | Admitting: Diagnostic Neuroimaging

## 2020-05-15 VITALS — BP 142/91 | HR 75 | Ht 59.0 in | Wt 193.4 lb

## 2020-05-15 DIAGNOSIS — M542 Cervicalgia: Secondary | ICD-10-CM

## 2020-05-15 DIAGNOSIS — G959 Disease of spinal cord, unspecified: Secondary | ICD-10-CM | POA: Diagnosis not present

## 2020-05-15 DIAGNOSIS — R519 Headache, unspecified: Secondary | ICD-10-CM

## 2020-05-15 DIAGNOSIS — R269 Unspecified abnormalities of gait and mobility: Secondary | ICD-10-CM

## 2020-05-15 NOTE — Patient Instructions (Signed)
-   check MRI brain and cervical spine  -To prevent or relieve headaches, try the following:   Cool Compress. Lie down and place a cool compress on your head.   Avoid headache triggers. If certain foods or odors seem to have triggered your migraines in the past, avoid them. A headache diary might help you identify triggers.   Include physical activity in your daily routine.   Manage stress. Find healthy ways to cope with the stressors, such as delegating tasks on your to-do list.   Practice relaxation techniques. Try deep breathing, yoga, massage and visualization.   Eat regularly. Eating regularly scheduled meals and maintaining a healthy diet might help prevent headaches. Also, drink plenty of fluids.   Follow a regular sleep schedule. Sleep deprivation might contribute to headaches  Consider biofeedback. With this mind-body technique, you learn to control certain bodily functions -- such as muscle tension, heart rate and blood pressure -- to prevent headaches or reduce headache pain.

## 2020-05-15 NOTE — Progress Notes (Signed)
[facility]  PATIENT: Tara Stark DOB: [birth year]  REFERRING CLINICIAN: Doree Albee, MD HISTORY FROM: patient  REASON FOR VISIT: new consult    HISTORICAL  CHIEF COMPLAINT:  Chief Complaint  Patient presents with  . Neck pain, ataxia    rm 7 New Pt "ever since epidural 2018- feeling off balance, like somehting is shifting inside my head;  no dizziness; neck pain"    HISTORY OF PRESENT ILLNESS:   39 year old female here for evaluation of gait and balance difficulty and neck pain.  Symptoms started in 2018 after her first pregnancy.  Symptoms are intermittent and last for few seconds at a time.  Sometimes she feels swimmy headed, off balance, swaying back and forth.  She has been having some headaches, neck pain issues as well.  She has been under more stress lately.  No nausea or vomiting.  No sensitive light or sound.  No history of migraines.   REVIEW OF SYSTEMS: Full 14 system review of systems performed and negative with exception of: As per HPI.  ALLERGIES: No Known Allergies  HOME MEDICATIONS: Outpatient Medications Prior to Visit  Medication Sig Dispense Refill  . amLODipine (NORVASC) 10 MG tablet Take 1 tablet (10 mg total) by mouth daily. 90 tablet 0  . Cholecalciferol (VITAMIN D3) 250 MCG (10000 UT) TABS Take 10,000 Units by mouth daily.    . ferrous sulfate 325 (65 FE) MG tablet Take 1 tablet (325 mg total) by mouth 2 (two) times daily with a meal. 60 tablet 1  . UNKNOWN TO PATIENT Supplement for BP control     No facility-administered medications prior to visit.    PAST MEDICAL HISTORY: Past Medical History:  Diagnosis Date  . Acid indigestion   . Ataxia   . Fibroid   . Hemorrhoids   . Hypertension   . Thyroid disease   . Vitamin D deficiency disease 04/18/2019    PAST SURGICAL HISTORY: Past Surgical History:  Procedure Laterality Date  . CESAREAN SECTION N/A 10/23/2019   Procedure: CESAREAN SECTION;  Surgeon: Osborne Oman, MD;  Location: [facility] LD [facility]S;  Service: Obstetrics;  Laterality: N/A;  Urgent Abruption  . TUBAL LIGATION      FAMILY HISTORY: Family History  Problem Relation Age of Onset  . Other Paternal Grandfather        MVA  . Breast cancer Paternal Grandmother   . Alzheimer's disease Maternal Grandmother   . Cancer Maternal Grandfather        throat  . Diabetes Father   . Other Father        has a pacemaker  . Hypertension Mother   . Hypertension Brother   . Hypertension Sister   . Hypertension Sister   . Bronchiolitis Son   . Hypertension Paternal Aunt     SOCIAL HISTORY: Social History   Socioeconomic History  . Marital status: Married    Spouse name: Mariaeduarda Defranco  . Number of children: 1  . Years of education: Not on file  . 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 Smoking status: Former Smoker    Years: 4.00    Types: Cigarettes  . Smokeless tobacco: Never Used  Vaping Use  . Vaping Use: Never used  Substance and Sexual Activity  . Alcohol use: No  . Drug use: No  . Sexual activity: Yes    Birth  control/protection: None  Other Topics Concern  . 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   Social Determinants of Health   Financial Resource Strain:   . Difficulty of Paying Living Expenses: Not on file  Food Insecurity:   . Worried About Charity fundraiser in the Last Year: Not on file  . Ran Out of Food in the Last Year: Not on file  Transportation Needs:   . Lack of Transportation (Medical): Not on file  . Lack of Transportation (Non-Medical): Not on file  Physical Activity:   . Days of Exercise per Week: Not on file  . Minutes of Exercise per Session: Not on file  Stress:   . Feeling of Stress : Not on file  Social Connections:   . Frequency of  Communication with Friends and Family: Not on file  . Frequency of Social Gatherings with Friends and Family: Not on file  . Attends Religious Services: Not on file  . Active Member of Clubs or Organizations: Not on file  . Attends Archivist Meetings: Not on file  . Marital Status: Not on file  Intimate Partner Violence:   . Fear of Current or Ex-Partner: Not on file  . Emotionally Abused: Not on file  . Physically Abused: Not on file  . Sexually Abused: Not on file     PHYSICAL EXAM  GENERAL EXAM/CONSTITUTIONAL: Vitals:  Vitals:   05/15/20 1424  BP: (!) 142/91  Pulse: 75  Weight: 193 lb 6.4 oz (87.7 kg)  Height: 4\' 11"  (1.499 m)     Body mass index is 39.06 kg/m. Wt Readings from Last 3 Encounters:  05/15/20 193 lb 6.4 oz (87.7 kg)  05/08/20 192 lb (87.1 kg)  05/03/20 188 lb (85.3 kg)     Patient is in no distress; well developed, nourished and groomed; neck is supple  CARDIOVASCULAR:  Examination of carotid arteries is normal; no carotid bruits  Regular rate and rhythm, no murmurs  Examination of peripheral vascular system by observation and palpation is normal  EYES:  Ophthalmoscopic exam of optic discs and posterior segments is normal; no papilledema or hemorrhages  No exam data present  MUSCULOSKELETAL:  Gait, strength, tone, movements noted in Neurologic exam below  NEUROLOGIC: MENTAL STATUS:  No flowsheet data found.  awake, alert, oriented to person, place and time  recent and remote memory intact  normal attention and concentration  language fluent, comprehension intact, naming intact  fund of knowledge appropriate  CRANIAL NERVE:   2nd - no papilledema on fundoscopic exam  2nd, 3rd, 4th, 6th - pupils equal and reactive to light, visual fields full to confrontation, extraocular muscles intact, no nystagmus  5th - facial sensation symmetric  7th - facial strength symmetric  8th - hearing intact  9th - palate elevates  symmetrically, uvula midline  11th - shoulder shrug symmetric  12th - tongue protrusion midline  MOTOR:   normal bulk and tone, full strength in the BUE, BLE  SENSORY:   normal and symmetric to light touch, temperature, vibration  COORDINATION:   finger-nose-finger, fine finger movements normal  REFLEXES:   deep tendon reflexes present and symmetric  GAIT/STATION:   narrow based gait;  romberg is negative     DIAGNOSTIC DATA (LABS, IMAGING, TESTING) - I reviewed patient records, labs, notes, testing and imaging myself where available.  Lab Results  Component Value Date   WBC 9.0 05/08/2020  HGB 13.7 05/08/2020   HCT 42.2 05/08/2020   MCV 83.4 05/08/2020   PLT 226 05/08/2020      Component Value Date/Time   NA 140 05/08/2020 1756   NA 139 10/13/2019 1211   K 4.3 05/08/2020 1756   CL 104 05/08/2020 1756   CO2 27 05/08/2020 1756   GLUCOSE 103 (H) 05/08/2020 1756   BUN 11 05/08/2020 1756   BUN 4 (L) 10/13/2019 1211   CREATININE 0.41 (L) 05/08/2020 1756   CALCIUM 8.8 05/08/2020 1756   PROT 7.2 05/08/2020 1756   PROT 6.3 10/13/2019 1211   ALBUMIN 2.6 (L) 10/23/2019 0604   ALBUMIN 3.6 (L) 10/13/2019 1211   AST 15 05/08/2020 1756   ALT 20 05/08/2020 1756   ALKPHOS 165 (H) 10/23/2019 0604   BILITOT 0.2 05/08/2020 1756   BILITOT 0.3 10/13/2019 1211   GFRNONAA 130 05/08/2020 1756   GFRAA 151 05/08/2020 1756   No results found for: CHOL, HDL, LDLCALC, LDLDIRECT, TRIG, CHOLHDL Lab Results  Component Value Date   HGBA1C 6.0 (H) 03/27/2020   No results found for: [id number] Lab Results  Component Value Date   TSH 0.60 03/27/2020       ASSESSMENT AND PLAN  38 y.o. year old female here with:   Dx:  1. Neck pain   2. Gait difficulty   3. Bilateral headaches   4. Disease of spinal cord (HCC)      PLAN:  GAIT DIFF / HEADACHES / NECK PAIN - check MRI brain and cervical spine (rule out demyelinating dz, stroke, vascular, autoimmune, inflamm  conditions)  Orders Placed This Encounter  Procedures  . MR BRAIN W WO CONTRAST  . MR [place]   Return for pending if symptoms worsen or fail to improve.    Penni Bombard, MD 62/95/2841, 3:24 PM Certified in Neurology, Neurophysiology and Neuroimaging  [facility] [address] [place], [place] [address] [phone]

## 2020-05-16 ENCOUNTER — Telehealth: Payer: Self-pay | Admitting: Diagnostic Neuroimaging

## 2020-05-16 NOTE — Telephone Encounter (Signed)
[facility] pending

## 2020-05-20 NOTE — Telephone Encounter (Signed)
I checked the status on the portal and it is still pending.

## 2020-05-21 ENCOUNTER — Encounter: Payer: Self-pay | Admitting: Cardiology

## 2020-05-21 ENCOUNTER — Encounter: Payer: Self-pay | Admitting: *Deleted

## 2020-05-21 ENCOUNTER — Other Ambulatory Visit: Payer: Self-pay

## 2020-05-21 ENCOUNTER — Ambulatory Visit (INDEPENDENT_AMBULATORY_CARE_PROVIDER_SITE_OTHER): Payer: Medicaid Other | Admitting: Cardiology

## 2020-05-21 VITALS — BP 134/87 | HR 86 | Ht 60.0 in | Wt 194.6 lb

## 2020-05-21 DIAGNOSIS — R002 Palpitations: Secondary | ICD-10-CM

## 2020-05-21 DIAGNOSIS — I10 Essential (primary) hypertension: Secondary | ICD-10-CM

## 2020-05-21 NOTE — Progress Notes (Signed)
Electrophysiology Office Note:    Date:  05/21/2020   ID:  Tara Stark, DOB [birth year], MRN [id number]  PCP:  Tara Albee, MD  [place] Cardiologist:  No primary care provider on file.  [facility] Electrophysiologist:  None   Referring MD: Tara Albee, MD   Chief Complaint: Palpitations  History of Present Illness:    Tara Stark is a 39 y.o. female who presents for an evaluation of palpitations at the request of Dr. Anastasio Stark. Their medical history includes hypertension and thyroid disease.  She saw her primary care physician on May 08, 2020.  During that visit she complained of daily palpitations lasting 5 to 10 minutes per episode.  During the episodes of palpitation she feels lightheaded and dizzy.  Past Medical History:  Diagnosis Date  . Acid indigestion   . Ataxia   . Fibroid   . Hemorrhoids   . Hypertension   . Thyroid disease   . Vitamin D deficiency disease 04/18/2019    Past Surgical History:  Procedure Laterality Date  . CESAREAN SECTION N/A 10/23/2019   Procedure: CESAREAN SECTION;  Surgeon: Osborne Oman, MD;  Location: [facility];  Service: Obstetrics;  Laterality: N/A;  Urgent Abruption  . TUBAL LIGATION      Current Medications: Current Meds  Medication Sig  . amLODipine (NORVASC) 10 MG tablet Take 1 tablet (10 mg total) by mouth daily.  . Cholecalciferol (VITAMIN D3) 250 MCG (10000 UT) TABS Take 10,000 Units by mouth daily.  . ferrous sulfate 325 (65 FE) MG tablet Take 1 tablet (325 mg total) by mouth 2 (two) times daily with a meal.  . UNKNOWN TO PATIENT Supplement for BP control     Allergies:   Patient has no known allergies.   Social History   Socioeconomic History  . Marital status: Married    Spouse name: Tara Stark  . Number of children: 1  . Years of education: Not on file  . 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 Smoking status: Former Smoker    Years: 4.00    Types: Cigarettes  . Smokeless tobacco: Never Used  Vaping Use  . Vaping Use: Never used  Substance and Sexual Activity  . Alcohol use: No  . Drug use: No  . Sexual activity: Yes    Birth control/protection: None  Other Topics Concern  . 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   Social Determinants of Health   Financial Resource Strain:   . Difficulty of Paying Living Expenses: Not on file  Food Insecurity:   . Worried About Charity fundraiser in the Last Year: Not on file  . Ran Out of Food in the Last Year: Not on file  Transportation Needs:   . Lack of Transportation (Medical): Not on file  . Lack of Transportation (Non-Medical): Not on file  Physical Activity:   . Days of Exercise per Week: Not on file  . Minutes of Exercise per Session: Not on file  Stress:   . Feeling of Stress : Not on file  Social Connections:   . Frequency of Communication with Friends and Family: Not on file  . Frequency of Social Gatherings with Friends and Family: Not on file  . Attends Religious Services: Not on file  . Active  Member of Clubs or Organizations: Not on file  . Attends Archivist Meetings: Not on file  . Marital Status: Not on file     Family History: The patient's family history includes Alzheimer's disease in her maternal grandmother; Breast cancer in her paternal grandmother; Bronchiolitis in her son; Cancer in her maternal grandfather; Diabetes in her father; Hypertension in her brother, mother, paternal aunt, sister, and sister; Other in her father and paternal grandfather.  ROS:   Please see the history of present illness.    All other systems reviewed and are negative.  EKGs/Labs/Other Studies Reviewed:    The following studies were reviewed today: Echo, prior notes  April 29, 2020 echo  personally reviewed Left ventricular function normal Right ventricular function normal No significant valvular abnormalities  EKG:  The ekg ordered today demonstrates sinus rhythm  Recent Labs: 10/23/2019: Magnesium 4.3 03/27/2020: TSH 0.60 05/08/2020: ALT 20; BUN 11; Creat 0.41; Hemoglobin 13.7; Platelets 226; Potassium 4.3; Sodium 140  Recent Lipid Panel No results found for: CHOL, TRIG, HDL, CHOLHDL, VLDL, LDLCALC, LDLDIRECT  Physical Exam:    VS:  BP 118/68   Pulse 86   Ht 5' (1.524 m)   Wt 194 lb 9.6 oz (88.3 kg)   SpO2 97%   BMI 38.01 kg/m     Wt Readings from Last 3 Encounters:  05/21/20 194 lb 9.6 oz (88.3 kg)  05/15/20 193 lb 6.4 oz (87.7 kg)  05/08/20 192 lb (87.1 kg)     GEN:  Well nourished, well developed in no acute distress HEENT: Normal NECK: No JVD; No carotid bruits LYMPHATICS: No lymphadenopathy CARDIAC: RRR, no murmurs, rubs, gallops RESPIRATORY:  Clear to auscultation without rales, wheezing or rhonchi  ABDOMEN: Soft, non-tender, non-distended MUSCULOSKELETAL:  No edema; No deformity  SKIN: Warm and dry NEUROLOGIC:  Alert and oriented x 3 PSYCHIATRIC:  Normal affect   ASSESSMENT:    1. Palpitations   2. Primary hypertension    PLAN:    In order of problems listed above:  1. Palpitations Ms. Knoll describes daily episodes of symptomatic rapid heartbeats.  No clear triggers or alleviating factors.  We will start with a 7-day ZIO monitor to see if we can capture any of these episodes.  We will follow-up in 4 weeks to discuss the results.  2.  Hypertension Her blood pressure is at goal today.  She tells me she has been off of her amlodipine for a week.  Based on her numbers today, I have advised her not to restart the amlodipine.   Medication Adjustments/Labs and Tests Ordered: Current medicines are reviewed at length with the patient today.  Concerns regarding medicines are outlined above.  Orders Placed This Encounter  Procedures  . EKG  12-Lead   No orders of the defined types were placed in this encounter.    Signed, Lars Mage, MD, [facility]  05/21/2020 11:35 AM    Electrophysiology [facility]

## 2020-05-21 NOTE — Patient Instructions (Addendum)
Medication Instructions:  Your physician recommends that you continue on your current medications as directed. Please refer to the Current Medication list given to you today.  *If you need a refill on your cardiac medications before your next appointment, please call your pharmacy*  Lab Work: None ordered.  If you have labs (blood work) drawn today and your tests are completely normal, you will receive your results only by: Marland Kitchen MyChart Message (if you have MyChart) OR . A paper copy in the mail If you have any lab test that is abnormal or we need to change your treatment, we will call you to review the results.  Testing/Procedures: None ordered.  Follow-Up: At [facility], you and your health needs are our priority.  As part of our continuing mission to provide you with exceptional heart care, we have created designated Provider Care Teams.  These Care Teams include your primary Cardiologist (physician) and Advanced Practice Providers (APPs -  Physician Assistants and Nurse Practitioners) who all work together to provide you with the care you need, when you need it.  We recommend signing up for the patient portal called "MyChart".  Sign up information is provided on this After Visit Summary.  MyChart is used to connect with patients for Virtual Visits (Telemedicine).  Patients are able to view lab/test results, encounter notes, upcoming appointments, etc.  Non-urgent messages can be sent to your provider as well.   To learn more about what you can do with MyChart, go to [email or web address].    Your next appointment:   Your physician wants you to follow-up in: 06/24/20 at 11:45 am  Other Instructions: ZIO XT- Long Term Monitor Instructions   Your physician has requested you wear your ZIO patch monitor__7___days.   This is a single patch monitor.  [employer] supplies one patch monitor per enrollment.  Additional stickers are not available.   Please do not apply patch if you will be  having a Nuclear Stress Test, Echocardiogram, Cardiac CT, MRI, or Chest Xray during the time frame you would be wearing the monitor. The patch cannot be worn during these tests.  You cannot remove and re-apply the ZIO XT patch monitor.   Your ZIO patch monitor will be sent USPS Priority mail from [facility] [employer] [facility] directly to your home address. The monitor may also be mailed to a PO BOX if home delivery is not available.   It may take 3-5 days to receive your monitor after you have been enrolled.   Once you have received you monitor, please review enclosed instructions.  Your monitor has already been registered assigning a specific monitor serial # to you.   Applying the monitor   Shave hair from upper left chest.   Hold abrader disc by orange tab.  Rub abrader in 40 strokes over left upper chest as indicated in your monitor instructions.   Clean area with 4 enclosed alcohol pads .  Use all pads to assure are is cleaned thoroughly.  Let dry.   Apply patch as indicated in monitor instructions.  Patch will be place under collarbone on left side of chest with arrow pointing upward.   Rub patch adhesive wings for 2 minutes.Remove white label marked "1".  Remove white label marked "2".  Rub patch adhesive wings for 2 additional minutes.   While looking in a mirror, press and release button in center of patch.  A small green light will flash 3-4 times .  This will be your only indicator the monitor has  been turned on.     Do not shower for the first 24 hours.  You may shower after the first 24 hours.   Press button if you feel a symptom. You will hear a small click.  Record Date, Time and Symptom in the Patient Log Book.   When you are ready to remove patch, follow instructions on last 2 pages of Patient Log Book.  Stick patch monitor onto last page of Patient Log Book.   Place Patient Log Book in [place] box.  Use locking tab on box and tape box closed securely.  The [employer] has  [employer] on it.  Please place in mailbox as soon as possible.  Your physician should have your test results approximately 7 days after the monitor has been mailed back to [facility].   Call [place] at [phone] if you have questions regarding your ZIO XT patch monitor.  Call them immediately if you see an orange light blinking on your monitor.   If your monitor falls off in less than 4 days contact our Monitor department at [phone].  If your monitor becomes loose or falls off after 4 days call [employer] at [phone] for suggestions on securing your monitor.

## 2020-05-21 NOTE — Progress Notes (Signed)
Patient ID: Tara Stark, female   DOB: 24-Dec-1980, 39 y.o.   MRN: [id number] Patient enrolled for [employer] to ship a 7 day ZIO XT long term holter monitor to her home.

## 2020-05-22 ENCOUNTER — Other Ambulatory Visit (INDEPENDENT_AMBULATORY_CARE_PROVIDER_SITE_OTHER): Payer: Self-pay | Admitting: Internal Medicine

## 2020-05-22 ENCOUNTER — Telehealth (INDEPENDENT_AMBULATORY_CARE_PROVIDER_SITE_OTHER): Payer: Self-pay

## 2020-05-22 MED ORDER — METOPROLOL TARTRATE 25 MG PO TABS: 25.0000 mg | ORAL_TABLET | Freq: Two times a day (BID) | ORAL | 3 refills | Status: DC

## 2020-05-22 NOTE — Telephone Encounter (Signed)
Called patient and LMOVM to return call  Left a detailed voice message to let patient know the message from Dr. Anastasio Champion.

## 2020-05-22 NOTE — Telephone Encounter (Signed)
Tell her that I have sent a new prescription for metoprolol 25 mg tablets, take one twice a day to [facility]. She should take this in addition to the amlodipine she is taking. Follow-up as scheduled.

## 2020-05-22 NOTE — Telephone Encounter (Signed)
Called patient and gave her the message from Dr. Anastasio Champion. Patient verbalized an understanding and will reach out to [place] if she has questions or any changes.

## 2020-05-22 NOTE — Telephone Encounter (Signed)
Patient called and she stated that she is having elevated BP levels and earlier today it was 147/107 on her left arm and on her right arm it was 154/108 and she is feeling tired and sluggish and having pain and discomfort and pain in her right arm and her right eye looks a little different. Patient stated that her right arm is painful to hold up or pick up anything it hurts. She just check her BP again in her right arm and it was 150/107. Patient did see cardiology yesterday. Patient is not sure what to do.  Please advise.

## 2020-05-23 NOTE — Telephone Encounter (Signed)
These medications should be okay to take at the same time.  Remember, the metoprolol is to be taken 1 tablet in the morning and 1 tablet in the evening.  The amlodipine is to be taken 1 tablet every 24 hours.  If she feels unwell when she takes a dose of metoprolol and amlodipine at the same time she could consider spacing the amlodipine out from the metoprolol by about 2 to 4 hours.  Often patients will feel a bit dizzy or tired for a couple of weeks when initiating new medicine for elevated blood pressure.  If she experiences any of those side effects, as long as they are mild she should try to continue taking the medications as prescribed for at least 2 weeks, if her side effects persist beyond 2 weeks she should call this office to notify us to see if we can make any adjustments to her medication regimen.

## 2020-05-23 NOTE — Telephone Encounter (Signed)
Patient called and left a detailed voice message and wants to know if she should take the Amlodipine and the Metoprolol at the same time or does she need to space the time she takes these apart from each other?   Please advise.

## 2020-05-23 NOTE — Telephone Encounter (Signed)
Called patient and gave her the message. Patient verbalized an understanding and stated that she is took her Metoprolol at 7am and her Amlodipine at 9am and she is feeling a little tired and will continue to take and call us if she needs to.

## 2020-05-23 NOTE — Telephone Encounter (Signed)
I spoke with the patient and informed her it is still pending with her insurance.

## 2020-05-25 ENCOUNTER — Ambulatory Visit (INDEPENDENT_AMBULATORY_CARE_PROVIDER_SITE_OTHER): Payer: Medicaid Other

## 2020-05-25 DIAGNOSIS — I10 Essential (primary) hypertension: Secondary | ICD-10-CM

## 2020-05-25 DIAGNOSIS — R002 Palpitations: Secondary | ICD-10-CM | POA: Diagnosis not present

## 2020-05-28 NOTE — Telephone Encounter (Signed)
[name]: [id number] (exp. 04/26/20 to 07/14/20) order sent to [facility]. They will reach out to the patient to schedule.

## 2020-06-09 ENCOUNTER — Other Ambulatory Visit: Payer: Self-pay

## 2020-06-09 ENCOUNTER — Ambulatory Visit
Admission: RE | Admit: 2020-06-09 | Discharge: 2020-06-09 | Disposition: A | Discharge status: Home / Self Care | Payer: Medicaid Other | Source: Ambulatory Visit | Attending: Diagnostic Neuroimaging | Admitting: Diagnostic Neuroimaging

## 2020-06-09 DIAGNOSIS — M542 Cervicalgia: Secondary | ICD-10-CM

## 2020-06-09 DIAGNOSIS — G959 Disease of spinal cord, unspecified: Secondary | ICD-10-CM

## 2020-06-09 DIAGNOSIS — R519 Headache, unspecified: Secondary | ICD-10-CM | POA: Diagnosis not present

## 2020-06-09 DIAGNOSIS — R269 Unspecified abnormalities of gait and mobility: Secondary | ICD-10-CM | POA: Diagnosis not present

## 2020-06-09 MED ORDER — GADOBENATE DIMEGLUMINE 529 MG/ML IV SOLN
18.0000 mL | Freq: Once | INTRAVENOUS | Status: AC | PRN
  Administered 2020-06-09: 18 mL via INTRAVENOUS

## 2020-06-10 ENCOUNTER — Encounter: Payer: Self-pay | Admitting: *Deleted

## 2020-06-11 ENCOUNTER — Telehealth (INDEPENDENT_AMBULATORY_CARE_PROVIDER_SITE_OTHER): Payer: Self-pay

## 2020-06-11 NOTE — Telephone Encounter (Signed)
Yes, she can stop the medication.

## 2020-06-11 NOTE — Telephone Encounter (Signed)
Called patient and she is very concerned about the heart flutters and the dizziness and wants to know if she can stop the medication until she comes to see you or what does she need to do?

## 2020-06-11 NOTE — Telephone Encounter (Signed)
She has an appointment with me in about a week's time and we can discuss all these in detail.  Essentially all the MRI results do look normal based on the report.

## 2020-06-11 NOTE — Telephone Encounter (Signed)
Called patient back and gave her the message and patient stated that she has had a large amount of weight gain since starting the metoprolol also. Patient will discuss at her next visit. Patient verbalized an understanding.

## 2020-06-11 NOTE — Telephone Encounter (Signed)
Patient called and stated that since she started the Metoprolol that she has had increased heart flutters throughout the day. Patient denies chest pain and no SOB. Patient states that she does have some dizziness.  Patient also wants to know her MRI/CT scan results. She is still hurting and hard to lift her arm and her neck and shoulder is hurting and has been worse within the last 2 weeks. She stated that she was told her scan was fine but she saw in the notes that it showed something and she is concerned.  Please advise.

## 2020-06-18 ENCOUNTER — Ambulatory Visit (INDEPENDENT_AMBULATORY_CARE_PROVIDER_SITE_OTHER): Payer: Medicaid Other | Admitting: Internal Medicine

## 2020-06-18 ENCOUNTER — Other Ambulatory Visit: Payer: Self-pay

## 2020-06-18 ENCOUNTER — Encounter (INDEPENDENT_AMBULATORY_CARE_PROVIDER_SITE_OTHER): Payer: Self-pay | Admitting: Internal Medicine

## 2020-06-18 VITALS — BP 134/82 | HR 94 | Temp 97.5°F | Ht 60.0 in | Wt 200.4 lb

## 2020-06-18 DIAGNOSIS — R002 Palpitations: Secondary | ICD-10-CM | POA: Diagnosis not present

## 2020-06-18 DIAGNOSIS — I10 Essential (primary) hypertension: Secondary | ICD-10-CM

## 2020-06-18 DIAGNOSIS — R42 Dizziness and giddiness: Secondary | ICD-10-CM

## 2020-06-18 DIAGNOSIS — E559 Vitamin D deficiency, unspecified: Secondary | ICD-10-CM

## 2020-06-18 NOTE — Patient Instructions (Signed)
[facility] Dietary Recommendations for Weight Loss What to Avoid . Avoid added sugars o Often added sugar can be found in processed foods such as many condiments, dry cereals, cakes, cookies, chips, crisps, crackers, candies, sweetened drinks, etc.  o Read labels and AVOID/DECREASE use of foods with the following in their ingredient list: Sugar, fructose, high fructose corn syrup, sucrose, glucose, maltose, dextrose, molasses, cane sugar, brown sugar, any type of syrup, agave nectar, etc.   . Avoid snacking in between meals . Avoid foods made with flour o If you are going to eat food made with flour, choose those made with whole-grains; and, minimize your consumption as much as is tolerable . Avoid processed foods o These foods are generally stocked in the middle of the grocery store. Focus on shopping on the perimeter of the grocery.  . Avoid Meat  o We recommend following a plant-based diet at [facility]. Thus, we recommend avoiding meat as a general rule. Consider eating beans, legumes, eggs, and/or dairy products for regular protein sources o If you plan on eating meat limit to 4 ounces of meat at a time and choose lean options such as Fish, chicken, turkey. Avoid red meat intake such as pork and/or steak What to Include . Vegetables o GREEN LEAFY VEGETABLES: Kale, spinach, mustard greens, collard greens, cabbage, broccoli, etc. o OTHER: Asparagus, cauliflower, eggplant, carrots, peas, Brussel sprouts, tomatoes, bell peppers, zucchini, beets, cucumbers, etc. . Grains, seeds, and legumes o Beans: kidney beans, black eyed peas, garbanzo beans, black beans, pinto beans, etc. o Whole, unrefined grains: brown rice, barley, bulgur, oatmeal, etc. . Healthy fats  o Avoid highly processed fats such as vegetable oil o Examples of healthy fats: avocado, olives, virgin olive oil, dark chocolate (?72% Cocoa), nuts (peanuts, almonds, walnuts, cashews, pecans, etc.) . None to Low  Intake of Animal Sources of Protein o Meat sources: chicken, turkey, salmon, tuna. Limit to 4 ounces of meat at one time. o Consider limiting dairy sources, but when choosing dairy focus on: PLAIN Greek yogurt, cottage cheese, high-protein milk . Fruit o Choose berries  When to Eat . Intermittent Fasting: o Choosing not to eat for a specific time period, but DO FOCUS ON HYDRATION when fasting o Multiple Techniques: - Time Restricted Eating: eat 3 meals in a day, each meal lasting no more than 60 minutes, no snacks between meals - 16-18 hour fast: fast for 16 to 18 hours up to 7 days a week. Often suggested to start with 2-3 nonconsecutive days per week.  . Remember the time you sleep is counted as fasting.  . Examples of eating schedule: Fast from 7:00pm-11:00am. Eat between 11:00am-7:00pm.  - 24-hour fast: fast for 24 hours up to every other day. Often suggested to start with 1 day per week . Remember the time you sleep is counted as fasting . Examples of eating schedule:  o Eating day: eat 2-3 meals on your eating day. If doing 2 meals, each meal should last no more than 90 minutes. If doing 3 meals, each meal should last no more than 60 minutes. Finish last meal by 7:00pm. o Fasting day: Fast until 7:00pm.  o IF YOU FEEL UNWELL FOR ANY REASON/IN ANY WAY WHEN FASTING, STOP FASTING BY EATING A NUTRITIOUS SNACK OR LIGHT MEAL o ALWAYS FOCUS ON HYDRATION DURING FASTS - Acceptable Hydration sources: water, broths, tea/coffee (black tea/coffee is best but using a small amount of whole-fat dairy products in coffee/tea is acceptable).  -   Poor Hydration Sources: anything with sugar or artificial sweeteners added to it  These recommendations have been developed for patients that are actively receiving medical care from either Dr. Caitriona Sundquist or Sarah Gray, DNP, NP-C at [facility]. These recommendations are developed for patients with specific medical conditions and are not meant to be  distributed or used by others that are not actively receiving care from either provider listed above at [facility]. It is not appropriate to participate in the above eating plans without proper medical supervision.   Reference: Fung, J. The obesity code. [place]: [place]; 2016.   

## 2020-06-18 NOTE — Progress Notes (Signed)
PCP:  Tara Albee, MD Primary Cardiologist: No primary care provider on file. Electrophysiologist: Vickie Epley, MD   Tara Stark is a 39 y.o. female seen today for Vickie Epley, MD for routine electrophysiology followup.  Since last being seen in our clinic the patient reports doing well. She continues to have palpitations daily, 1-3 times. These last for a split second and are accompanied by lightheadedness. She is following with her PCP. He is recommending intermittent fasting for weight loss, including coffee throughout the day to keep her full.  she denies chest pain, dyspnea, PND, orthopnea, nausea, vomiting, syncope, edema, weight gain, or early satiety.  Monitor showed 05/2020 HR 58-167, average 90. Rare supraventricular and ventricular ectopy.    Past Medical History:  Diagnosis Date  . Acid indigestion   . Ataxia   . Fibroid   . Hemorrhoids   . Hypertension   . Thyroid disease   . Vitamin D deficiency disease 04/18/2019   Past Surgical History:  Procedure Laterality Date  . CESAREAN SECTION N/A 10/23/2019   Procedure: CESAREAN SECTION;  Surgeon: Osborne Oman, MD;  Location: [facility];  Service: Obstetrics;  Laterality: N/A;  Urgent Abruption  . TUBAL LIGATION      Current Outpatient Medications  Medication Sig Dispense Refill  . amLODipine (NORVASC) 10 MG tablet Take 1 tablet (10 mg total) by mouth daily. 90 tablet 0  . Cholecalciferol (VITAMIN D3) 250 MCG (10000 UT) TABS Take 10,000 Units by mouth daily.    . ferrous sulfate 325 (65 FE) MG tablet Take 1 tablet (325 mg total) by mouth 2 (two) times daily with a meal. 60 tablet 1   No current facility-administered medications for this visit.    No Known Allergies  Social History   Socioeconomic History  . Marital status: Married    Spouse name: Tylena Prisk  . Number of children: 1  . Years of education: Not on file  . 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 Smoking status: Former Smoker    Years: 4.00    Types: Cigarettes  . Smokeless tobacco: Never Used  Vaping Use  . Vaping Use: Never used  Substance and Sexual Activity  . Alcohol use: No  . Drug use: No  . Sexual activity: Yes    Birth control/protection: None  Other Topics Concern  . 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   Social Determinants of Health   Financial Resource Strain:   . Difficulty of Paying Living Expenses: Not on file  Food Insecurity:   . Worried About Charity fundraiser in the Last Year: Not on file  . Ran Out of Food in the Last Year: Not on file  Transportation Needs:   . Lack of Transportation (Medical): Not on file  . Lack of Transportation (Non-Medical): Not on file  Physical Activity:   . Days of Exercise per Week: Not on file  . Minutes of Exercise per Session: Not on file  Stress:   . Feeling of Stress : Not on file  Social Connections:   . Frequency of Communication with Friends and Family: Not on file  . Frequency of Social Gatherings with Friends and Family: Not on file  . Attends Religious Services: Not on file  . Active Member of Clubs  or Organizations: Not on file  . Attends Archivist Meetings: Not on file  . Marital Status: Not on file  Intimate Partner Violence:   . Fear of Current or Ex-Partner: Not on file  . Emotionally Abused: Not on file  . Physically Abused: Not on file  . Sexually Abused: Not on file     Review of Systems: General: No chills, fever, night sweats or weight changes  Cardiovascular:  No chest pain, dyspnea on exertion, edema, orthopnea, palpitations, paroxysmal nocturnal dyspnea Dermatological: No rash, lesions or masses Respiratory: No cough, dyspnea Urologic: No hematuria, dysuria Abdominal: No nausea, vomiting,  diarrhea, bright red blood per rectum, melena, or hematemesis Neurologic: No visual changes, weakness, changes in mental status All other systems reviewed and are otherwise negative except as noted above.  Physical Exam: Vitals:   06/19/20 0809  BP: 132/90  Pulse: 89  SpO2: 98%  Weight: 198 lb 6.4 oz (90 kg)  Height: 5' (1.524 m)    GEN- The patient is well appearing, alert and oriented x 3 today.   HEENT: normocephalic, atraumatic; sclera clear, conjunctiva pink; hearing intact; oropharynx clear; neck supple, no JVP Lymph- no cervical lymphadenopathy Lungs- Clear to ausculation bilaterally, normal work of breathing.  No wheezes, rales, rhonchi Heart- Regular rate and rhythm, no murmurs, rubs or gallops, PMI not laterally displaced GI- soft, non-tender, non-distended, bowel sounds present, no hepatosplenomegaly Extremities- no clubbing, cyanosis, or edema; DP/PT/radial pulses 2+ bilaterally MS- no significant deformity or atrophy Skin- warm and dry, no rash or lesion Psych- euthymic mood, full affect Neuro- strength and sensation are intact  EKG is not ordered.   Additional studies reviewed include: Monitor 05/2020 HR 58-167, average 90. Rare supraventricular and ventricular ectopy.  Lysbeth Galas T. Quentin Ore, MD, [facility] Cardiac Electrophysiology   Assessment and Plan:  1. Palpitations Monitor showed rare PVCs/PACs which may be contributing. Rare, no AAD at this time Lifestyle management at this time. This happens 1-3 times a day for a "split second" I let her know that coffee throughout the day, even decaf, could exacerbate her palpitations. Recommended she also consider heart healthy diets such as [place] or [place].   2. HTN Continue current medications  3. Obesity Body mass index is 38.75 kg/m.   4. Snoring Suspected sleep apnea. Will order sleep study She snores heavily, BMI >35, +HTN, and may fall asleep quickly in the afternoons if she sits to rest.     Shirley Friar, PA-C  06/19/20 8:14 AM  Patient Name: Tara Stark  DOB: [birth year] Height: 5 foot Weight:  Last Weight  Most recent update: 06/19/2020  8:12 AM   Weight  90 kg (198 lb 6.4 oz)            Referring Provider: Shirley Friar, PA-C Today's Date: 06/19/20   STOP BANG RISK ASSESSMENT  S(snore) Have you been told that your snore?  T(tired) Are you often tired, fatigued, or sleepy during the day? O(obstruction) Do you stop breathing, choke or gasp during sleep? P(pressure) Do you have or are you being treated for high blood pressure? B(BMI) Is your body index greater than 35 kg/m? Body mass index is 38.75 kg/m.  A(age) Are you 8 years old or older? 39 y.o. N(neck) Do you have a neck circumference greater than 40 cm? G(gender) Are you a female? female  Clinical Notes: Will consult Sleep Specialist and refer for management of therapy due to patient increased risk of Sleep Apnea. Ordering a sleep  study due to the following two clinical symptoms: Excessive daytime sleepiness G47.10 / Gastroesphageal reflux K21.9 / Nocturia R35.1 / Morning Headaches G44.21 / Difficulty concentrating R41.840 / Memory problems or poor judgement G31.84 / Personality changes or irritability R45.4 / Loud snoring R06.83 / Depression F32.9 / Unrefreshed by sleep G47.8 / Impotence N52.9 / History of high blood pressure R03.0 / Insomnia G47.00

## 2020-06-18 NOTE — Progress Notes (Signed)
Metrics: Intervention Frequency ACO  Documented Smoking Status Yearly  Screened one or more times in 24 months  Cessation Counseling or  Active cessation medication Past 24 months  Past 24 months   Guideline developer: UpToDate (See UpToDate for funding source) Date Released: 2014       Wellness Office Visit  Subjective:  Patient ID: Tara Stark, female    DOB: 1981/02/25  Age: 39 y.o. MRN: [id number]  CC: This lady comes in for follow-up of hypertension, palpitations and dizziness, vitamin D deficiency and obesity. HPI  She discontinued her metoprolol and since that time her palpitations somewhat eased.  She has already been seen by cardiology and had a monitor which does seem to show an abnormality in she will follow up with a cardiologist tomorrow. She continues with amlodipine for hypertension. She continues on vitamin D3 supplementation for vitamin D deficiency. She is concerned about weight gain. Past Medical History:  Diagnosis Date  . Acid indigestion   . Ataxia   . Fibroid   . Hemorrhoids   . Hypertension   . Thyroid disease   . Vitamin D deficiency disease 04/18/2019   Past Surgical History:  Procedure Laterality Date  . CESAREAN SECTION N/A 10/23/2019   Procedure: CESAREAN SECTION;  Surgeon: Osborne Oman, MD;  Location: [facility];  Service: Obstetrics;  Laterality: N/A;  Urgent Abruption  . TUBAL LIGATION       Family History  Problem Relation Age of Onset  . Other Paternal Grandfather        MVA  . Breast cancer Paternal Grandmother   . Alzheimer's disease Maternal Grandmother   . Cancer Maternal Grandfather        throat  . Diabetes Father   . Other Father        has a pacemaker  . Hypertension Mother   . Hypertension Brother   . Hypertension Sister   . Hypertension Sister   . Bronchiolitis Son   . 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   Social History   Tobacco Use  . Smoking status: Former Smoker    Years: 4.00    Types: Cigarettes  . Smokeless tobacco: Never Used  Substance Use Topics  . Alcohol use: No    Current Meds  Medication Sig  . amLODipine (NORVASC) 10 MG tablet Take 1 tablet (10 mg total) by mouth daily.  . Cholecalciferol (VITAMIN D3) 250 MCG (10000 UT) TABS Take 10,000 Units by mouth daily.  . ferrous sulfate 325 (65 FE) MG tablet Take 1 tablet (325 mg total) by mouth 2 (two) times daily with a meal.  . UNKNOWN TO PATIENT Supplement for BP control      Depression screen [facility] 2/9 06/18/2020 05/17/2019 03/17/2018 03/09/2018  Decreased Interest 0 0 0 0  Down, Depressed, Hopeless 0 0 0 0  PHQ - 2 Score 0 0 0 0  Altered sleeping 0 0 0 -  Tired, decreased energy 0 0 0 -  Change in appetite 0 0 1 -  Feeling bad or failure about yourself  0 0 0 -  Trouble concentrating 0 0 0 -  Moving slowly or fidgety/restless 0 0 0 -  Suicidal thoughts 0 0 0 -  PHQ-9 Score 0 0 1 -  Difficult doing work/chores Not difficult at all - Somewhat difficult -     Objective:  Today's Vitals: BP 134/82   Pulse 94   Temp (!) 97.5 F (36.4 C) (Temporal)   Ht 5' (1.524 m)   Wt 200 lb 6.4 oz (90.9 kg)   SpO2 96%   BMI 39.14 kg/m  Vitals with BMI 06/18/2020 05/21/2020 05/15/2020  Height 5\' 0"  5\' 0"  4\' 11"   Weight 200 lbs 6 oz 194 lbs 10 oz 193 lbs 6 oz  BMI 39.14 09.81 19.14  Systolic 782 956 213  Diastolic 82 87 91  Pulse 94 86 75     Physical Exam  She is morbidly obese.  Blood pressure is acceptable today.  She is alert and orientated without any focal logical signs.     Assessment   1. Essential hypertension, benign   2. Palpitations   3. Dizziness   4. Vitamin D deficiency disease   5. Morbid obesity ([place])       Tests ordered No orders of the defined types were placed in this encounter.    Plan: 1. She will continue with amlodipine for hypertension. 2. She will  continue with vitamin D3 supplementation for vitamin D deficiency. 3. Today I discussed nutrition and the concept of intermittent fasting, maintaining good hydration with a more of a plant-based diet.  I have given her diet sheet. 4. Follow-up with Judson Roch in about 3 months time she will need blood work then as she is prediabetic.   No orders of the defined types were placed in this encounter.   Doree Albee, MD

## 2020-06-19 ENCOUNTER — Encounter: Payer: Self-pay | Admitting: Student

## 2020-06-19 ENCOUNTER — Ambulatory Visit: Payer: Medicaid Other | Admitting: Student

## 2020-06-19 VITALS — BP 132/90 | HR 89 | Ht 60.0 in | Wt 198.4 lb

## 2020-06-19 DIAGNOSIS — R002 Palpitations: Secondary | ICD-10-CM

## 2020-06-19 DIAGNOSIS — R0683 Snoring: Secondary | ICD-10-CM

## 2020-06-19 DIAGNOSIS — I10 Essential (primary) hypertension: Secondary | ICD-10-CM

## 2020-06-19 DIAGNOSIS — R29818 Other symptoms and signs involving the nervous system: Secondary | ICD-10-CM | POA: Diagnosis not present

## 2020-06-19 MED ORDER — AMLODIPINE BESYLATE 10 MG PO TABS: 10.0000 mg | ORAL_TABLET | Freq: Every day | ORAL | 3 refills | Status: DC

## 2020-06-19 NOTE — Patient Instructions (Signed)
Medication Instructions:  Your physician recommends that you continue on your current medications as directed. Please refer to the Current Medication list given to you today.  *If you need a refill on your cardiac medications before your next appointment, please call your pharmacy*   Lab Work: None ordered   If you have labs (blood work) drawn today and your tests are completely normal, you will receive your results only by: Marland Kitchen MyChart Message (if you have MyChart) OR . A paper copy in the mail If you have any lab test that is abnormal or we need to change your treatment, we will call you to review the results.   Testing/Procedures: Your physician has recommended that you have a sleep study. This test records several body functions during sleep, including: brain activity, eye movement, oxygen and carbon dioxide blood levels, heart rate and rhythm, breathing rate and rhythm, the flow of air through your mouth and nose, snoring, body muscle movements, and chest and belly movement. Someone from our sleep department will contact you with a appointment.     Follow-Up: At [facility], you and your health needs are our priority.  As part of our continuing mission to provide you with exceptional heart care, we have created designated Provider Care Teams.  These Care Teams include your primary Cardiologist (physician) and Advanced Practice Providers (APPs -  Physician Assistants and Nurse Practitioners) who all work together to provide you with the care you need, when you need it.  We recommend signing up for the patient portal called "MyChart".  Sign up information is provided on this After Visit Summary.  MyChart is used to connect with patients for Virtual Visits (Telemedicine).  Patients are able to view lab/test results, encounter notes, upcoming appointments, etc.  Non-urgent messages can be sent to your provider as well.   To learn more about what you can do with MyChart, go to  [email or web address].    Your next appointment:   3 month(s)  The format for your next appointment:   In Person  Provider:   Lars Mage, MD   Other Instructions None

## 2020-06-24 ENCOUNTER — Ambulatory Visit: Payer: Medicaid Other | Admitting: Cardiology

## 2020-06-27 ENCOUNTER — Telehealth: Payer: Self-pay | Admitting: *Deleted

## 2020-06-27 NOTE — Telephone Encounter (Signed)
-----   Message from Mendel Ryder, [place] sent at 06/19/2020  8:30 AM EST ----- Regarding: Sleep Study Patient has been ordered to have a sleep study per Oda Kilts, PA  Dx: Snoring   Thank you

## 2020-07-01 ENCOUNTER — Encounter: Payer: Self-pay | Admitting: General Practice

## 2020-07-02 NOTE — Telephone Encounter (Signed)
Patient is requesting to schedule split night study.

## 2020-07-03 NOTE — Telephone Encounter (Signed)
Patient request a home sleep test is this ok?

## 2020-07-04 NOTE — Telephone Encounter (Signed)
Yes, thank you.

## 2020-07-05 NOTE — Telephone Encounter (Signed)
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

## 2020-07-08 ENCOUNTER — Telehealth (INDEPENDENT_AMBULATORY_CARE_PROVIDER_SITE_OTHER): Payer: Medicaid Other | Admitting: Internal Medicine

## 2020-07-08 ENCOUNTER — Encounter (INDEPENDENT_AMBULATORY_CARE_PROVIDER_SITE_OTHER): Payer: Self-pay | Admitting: Internal Medicine

## 2020-07-08 DIAGNOSIS — R059 Cough, unspecified: Secondary | ICD-10-CM | POA: Diagnosis not present

## 2020-07-08 NOTE — Progress Notes (Signed)
Metrics: Intervention Frequency ACO  Documented Smoking Status Yearly  Screened one or more times in 24 months  Cessation Counseling or  Active cessation medication Past 24 months  Past 24 months   Guideline developer: UpToDate (See UpToDate for funding source) Date Released: 2014       Wellness Office Visit  Subjective:  Patient ID: Tara Stark, female    DOB: 15-Nov-1980  Age: 39 y.o. MRN: [id number]  CC: This is an audio telemedicine visit with the permission of the patient who is at home and I am in my office.  I was able to used 2 identifiers to identify the patient. Dry cough HPI  The patient started having symptoms about a week ago with a dry cough which is persistent.  Initially, she also had a headache but this is not a major issue.  She had some chills but no fevers or body aches now.  She has no dyspnea.  Her mother tested positive for Covid yesterday and she has been in contact with her. Past Medical History:  Diagnosis Date  . Acid indigestion   . Ataxia   . Fibroid   . Hemorrhoids   . Hypertension   . Thyroid disease   . Vitamin D deficiency disease 04/18/2019   Past Surgical History:  Procedure Laterality Date  . CESAREAN SECTION N/A 10/23/2019   Procedure: CESAREAN SECTION;  Surgeon: Osborne Oman, MD;  Location: [facility];  Service: Obstetrics;  Laterality: N/A;  Urgent Abruption  . TUBAL LIGATION       Family History  Problem Relation Age of Onset  . Other Paternal Grandfather        MVA  . Breast cancer Paternal Grandmother   . Alzheimer's disease Maternal Grandmother   . Cancer Maternal Grandfather        throat  . Diabetes Father   . Other Father        has a pacemaker  . Hypertension Mother   . Hypertension Brother   . Hypertension Sister   . Hypertension Sister   . Bronchiolitis Son   . 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   Social History   Tobacco Use  . Smoking status: Former Smoker    Years: 4.00    Types: Cigarettes  . Smokeless tobacco: Never Used  Substance Use Topics  . Alcohol use: No    Current Meds  Medication Sig  . amLODipine (NORVASC) 10 MG tablet Take 1 tablet (10 mg total) by mouth daily.  . Cholecalciferol (VITAMIN D3) 250 MCG (10000 UT) TABS Take 10,000 Units by mouth daily.  . ferrous sulfate 325 (65 FE) MG tablet Take 1 tablet (325 mg total) by mouth 2 (two) times daily with a meal.      Depression screen [facility] 2/9 06/18/2020 05/17/2019 03/17/2018 03/09/2018  Decreased Interest 0 0 0 0  Down, Depressed, Hopeless 0 0 0 0  PHQ - 2 Score 0 0 0 0  Altered sleeping 0 0 0 -  Tired, decreased energy 0 0 0 -  Change in appetite 0 0 1 -  Feeling bad or failure about yourself  0 0 0 -  Trouble concentrating 0 0 0 -  Moving slowly or fidgety/restless 0 0 0 -  Suicidal thoughts 0 0 0 -  PHQ-9 Score 0 0 1 -  Difficult doing work/chores Not difficult at  all - Somewhat difficult -     Objective:   Today's Vitals: There were no vitals taken for this visit. Vitals with BMI 07/08/2020 06/19/2020 06/18/2020  Height (No Data) 5\' 0"  5\' 0"   Weight (No Data) 198 lbs 6 oz 200 lbs 6 oz  BMI - 73.41 93.79  Systolic (No Data) 024 097  Diastolic (No Data) 90 82  Pulse - 89 94     Physical Exam   She appears to be alert in the phone and orientated.    Assessment   1. Cough       Tests ordered No orders of the defined types were placed in this encounter.    Plan: 1. I recommended Robitussin-DM over-the-counter for her symptomatic cough.  I also recommended that she get tested for COVID-19 and if she is positive, she should let us know. 2. This phone call lasted 5 minutes.   No orders of the defined types were placed in this encounter.   Doree Albee, MD

## 2020-07-09 ENCOUNTER — Telehealth (INDEPENDENT_AMBULATORY_CARE_PROVIDER_SITE_OTHER): Payer: Medicaid Other | Admitting: Internal Medicine

## 2020-07-09 ENCOUNTER — Telehealth: Payer: Self-pay | Admitting: Student

## 2020-07-09 NOTE — Telephone Encounter (Signed)
Cancelled pre-auth request for split night.  It was going to be denied due to there not being enough information to be considered medically necessary. If it had been denied you have to wait 30-90 days to request again.  The HST [phone]) is a non-covered test.  How would you like to proceed?

## 2020-09-18 ENCOUNTER — Ambulatory Visit (INDEPENDENT_AMBULATORY_CARE_PROVIDER_SITE_OTHER): Payer: Medicaid Other | Admitting: Nurse Practitioner

## 2020-09-18 ENCOUNTER — Encounter (INDEPENDENT_AMBULATORY_CARE_PROVIDER_SITE_OTHER): Payer: Self-pay | Admitting: Nurse Practitioner

## 2020-09-24 ENCOUNTER — Ambulatory Visit: Payer: Medicaid Other | Admitting: Cardiology

## 2020-10-15 ENCOUNTER — Telehealth (INDEPENDENT_AMBULATORY_CARE_PROVIDER_SITE_OTHER): Payer: Self-pay

## 2020-10-15 ENCOUNTER — Encounter (INDEPENDENT_AMBULATORY_CARE_PROVIDER_SITE_OTHER): Payer: Self-pay | Admitting: Nurse Practitioner

## 2020-10-15 ENCOUNTER — Ambulatory Visit (INDEPENDENT_AMBULATORY_CARE_PROVIDER_SITE_OTHER): Payer: Medicaid Other | Admitting: Nurse Practitioner

## 2020-10-15 NOTE — Telephone Encounter (Signed)
Called pt on mobile told pt she missed her appt today. This is a "no Show". So now, if she miss anymore going forward  she will be dismissed from the practice. Per [facility] policy. Pt understands; will have to reschedule  & make sure she keeps next appt.  Pt was explain she had several appointments  & she has either "no showed" or "canceled".  Pt will needed to follow up on the referrals sent and given to complete before coming back to [facility].   Pt said she  Will, but she has been so busy with new daycare job, etc.  Will try to do better. I told  Her it is very important she to the work on making these appointments. This is to health better her health over all. To get to the root of her symptoms.

## 2020-10-28 ENCOUNTER — Encounter (INDEPENDENT_AMBULATORY_CARE_PROVIDER_SITE_OTHER): Payer: Self-pay

## 2020-11-19 ENCOUNTER — Encounter (INDEPENDENT_AMBULATORY_CARE_PROVIDER_SITE_OTHER): Payer: Self-pay | Admitting: Nurse Practitioner

## 2020-11-19 ENCOUNTER — Other Ambulatory Visit: Payer: Self-pay

## 2020-11-19 ENCOUNTER — Ambulatory Visit (INDEPENDENT_AMBULATORY_CARE_PROVIDER_SITE_OTHER): Payer: Medicaid Other | Admitting: Nurse Practitioner

## 2020-11-19 VITALS — BP 136/84 | HR 100 | Temp 97.3°F | Ht 60.0 in | Wt 224.6 lb

## 2020-11-19 DIAGNOSIS — E66813 Obesity, class 3: Secondary | ICD-10-CM

## 2020-11-19 DIAGNOSIS — R0683 Snoring: Secondary | ICD-10-CM

## 2020-11-19 DIAGNOSIS — R0681 Apnea, not elsewhere classified: Secondary | ICD-10-CM | POA: Diagnosis not present

## 2020-11-19 DIAGNOSIS — E119 Type 2 diabetes mellitus without complications: Secondary | ICD-10-CM | POA: Insufficient documentation

## 2020-11-19 DIAGNOSIS — I10 Essential (primary) hypertension: Secondary | ICD-10-CM

## 2020-11-19 DIAGNOSIS — R5383 Other fatigue: Secondary | ICD-10-CM

## 2020-11-19 DIAGNOSIS — R7303 Prediabetes: Secondary | ICD-10-CM

## 2020-11-19 DIAGNOSIS — Z6841 Body Mass Index (BMI) 40.0 and over, adult: Secondary | ICD-10-CM | POA: Diagnosis not present

## 2020-11-19 DIAGNOSIS — E559 Vitamin D deficiency, unspecified: Secondary | ICD-10-CM

## 2020-11-19 DIAGNOSIS — D649 Anemia, unspecified: Secondary | ICD-10-CM | POA: Diagnosis not present

## 2020-11-19 NOTE — Patient Instructions (Addendum)
DR. Leta Baptist - [phone] (NEUROLOGY)     [place] [facility] Dietary Recommendations for Weight Loss What to Avoid . Avoid added sugars o Often added sugar can be found in processed foods such as many condiments, dry cereals, cakes, cookies, chips, crisps, crackers, candies, sweetened drinks, etc.  o Read labels and AVOID/DECREASE use of foods with the following in their ingredient list: Sugar, fructose, high fructose corn syrup, sucrose, glucose, maltose, dextrose, molasses, cane sugar, brown sugar, any type of syrup, agave nectar, etc.   . Avoid snacking in between meals . Avoid foods made with flour o If you are going to eat food made with flour, choose those made with whole-grains; and, minimize your consumption as much as is tolerable . Avoid processed foods o These foods are generally stocked in the middle of the grocery store. Focus on shopping on the perimeter of the grocery.  . Avoid Meat  o We recommend following a plant-based diet at [facility]. Thus, we recommend avoiding meat as a general rule. Consider eating beans, legumes, eggs, and/or dairy products for regular protein sources o If you plan on eating meat limit to 4 ounces of meat at a time and choose lean options such as Fish, chicken, [place]. Avoid red meat intake such as pork and/or steak What to Include . Vegetables o GREEN LEAFY VEGETABLES: Kale, spinach, mustard greens, collard greens, cabbage, broccoli, etc. o OTHER: Asparagus, cauliflower, eggplant, carrots, peas, Brussel sprouts, tomatoes, bell peppers, zucchini, beets, cucumbers, etc. . Grains, seeds, and legumes o Beans: kidney beans, black eyed peas, garbanzo beans, black beans, pinto beans, etc. o Whole, unrefined grains: brown rice, barley, bulgur, oatmeal, etc. . Healthy fats  o Avoid highly processed fats such as vegetable oil o Examples of healthy fats: avocado, olives, virgin olive oil, dark chocolate (?72% Cocoa), nuts (peanuts,  almonds, walnuts, cashews, pecans, etc.) . None to Low Intake of Animal Sources of Protein o Meat sources: chicken, [place], salmon, tuna. Limit to 4 ounces of meat at one time. o Consider limiting dairy sources, but when choosing dairy focus on: PLAIN [place] yogurt, cottage cheese, high-protein milk . Fruit o Choose berries  When to Eat . Intermittent Fasting: o Choosing not to eat for a specific time period, but DO FOCUS ON HYDRATION when fasting o Multiple Techniques: - Time Restricted Eating: eat 3 meals in a day, each meal lasting no more than 60 minutes, no snacks between meals - 16-18 hour fast: fast for 16 to 18 hours up to 7 days a week. Often suggested to start with 2-3 nonconsecutive days per week.  . Remember the time you sleep is counted as fasting.  . Examples of eating schedule: Fast from 7:00pm-11:00am. Eat between 11:00am-7:00pm.  - 24-hour fast: fast for 24 hours up to every other day. Often suggested to start with 1 day per week . Remember the time you sleep is counted as fasting . Examples of eating schedule:  o Eating day: eat 2-3 meals on your eating day. If doing 2 meals, each meal should last no more than 90 minutes. If doing 3 meals, each meal should last no more than 60 minutes. Finish last meal by 7:00pm. o Fasting day: Fast until 7:00pm.  o IF YOU FEEL UNWELL FOR ANY REASON/IN ANY WAY WHEN FASTING, STOP FASTING BY EATING A NUTRITIOUS SNACK OR LIGHT MEAL o ALWAYS FOCUS ON HYDRATION DURING FASTS - Acceptable Hydration sources: water, broths, tea/coffee (black tea/coffee is best but using a small amount  of whole-fat dairy products in coffee/tea is acceptable).  - Poor Hydration Sources: anything with sugar or artificial sweeteners added to it  These recommendations have been developed for patients that are actively receiving medical care from either Dr. Anastasio Champion or Jeralyn Ruths, DNP, NP-C at [facility]. These recommendations are developed for patients with  specific medical conditions and are not meant to be distributed or used by others that are not actively receiving care from either provider listed above at [facility]. It is not appropriate to participate in the above eating plans without proper medical supervision.   Reference: Rexanne Mano. The obesity code. [place]; 2016.

## 2020-11-19 NOTE — Progress Notes (Signed)
Subjective:  Patient ID: Tara Stark, female    DOB: Jan 11, 1981  Age: 40 y.o. MRN: [id number]  CC:  Chief Complaint  Patient presents with  . Follow-up    Doing okay, reschedule the sleep study, has a new job now and had several changes, working on loosing weight  . Other    Snoring/daytime fatigue/witnessed apneic episodes, vitamin D deficiency  . Hypertension  . Obesity  . Prediabetes  . Anemia      HPI  This patient arrives today for the above.  Snoring/daytime fatigue/witnessed apneic episodes: She was being evaluated by neurology for possible sleep apnea.  He tells me she is been having some issues with getting the sleep study scheduled because she was preferring an at home study if possible.  She has seen her neurologist within the last 3 years and is wondering what she should do moving forward to get the sleep study scheduled.  Vitamin D deficiency: She has a history of vitamin D deficiency.  She continues on 10,000 IUs of vitamin D3 daily.  Last serum check showed a serum level of 57.  This was collected about 8 months ago.  Hypertension: She continues on amlodipine.  She has started a new job which is much less stressful for her.  She is now trying to focus on her lifestyle and recently bought a treadmill so she can start working on a regular basis.  Obesity: As stated above she is starting new job which is much less stressful.  She is trying to make lifestyle changes aimed at helping her with weight loss.  She is increasing her physical activity and is motivated to make some changes to her diet.  Prediabetes: Last A1c was collected back in September 2021 it was 6.0.  She has attempted some intermittent fasting the past was not tolerated very well.  Anemia: She also has a history of anemia.  She is no longer on her daily iron tablet at this time.  Past Medical History:  Diagnosis Date  . Acid indigestion   . Ataxia   . Fibroid   . Hemorrhoids   .  Hypertension   . Thyroid disease   . Vitamin D deficiency disease 04/18/2019      Family History  Problem Relation Age of Onset  . Other Paternal Grandfather        MVA  . Breast cancer Paternal Grandmother   . Alzheimer's disease Maternal Grandmother   . Cancer Maternal Grandfather        throat  . Diabetes Father   . Other Father        has a pacemaker  . Hypertension Mother   . Hypertension Brother   . Hypertension Sister   . Hypertension Sister   . Bronchiolitis Son   . 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   Social History   Tobacco Use  . Smoking status: Former Smoker    Years: 4.00    Types: Cigarettes  . Smokeless tobacco: Never Used  Substance Use Topics  . Alcohol use: No     Current Meds  Medication Sig  . amLODipine (NORVASC) 10 MG tablet Take 1 tablet (10 mg total) by mouth daily.  . Cholecalciferol (VITAMIN D3) 250 MCG (10000 UT) TABS Take 10,000 Units by mouth daily.    ROS:  See HPI.  All other pertinent review  of systems negative.   Objective:   Today's Vitals: BP 136/84   Pulse 100   Temp (!) 97.3 F (36.3 C) (Temporal)   Ht 5' (1.524 m)   Wt 224 lb 9.6 oz (101.9 kg)   SpO2 97%   BMI 43.86 kg/m  Vitals with BMI 11/19/2020 07/08/2020 06/19/2020  Height 5' 0"  (No Data) 5' 0"   Weight 224 lbs 10 oz (No Data) 198 lbs 6 oz  BMI 75.91 - 63.84  Systolic 665 (No Data) 993  Diastolic 84 (No Data) 90  Pulse 100 - 89     Physical Exam Vitals reviewed.  Constitutional:      General: She is not in acute distress.    Appearance: Normal appearance.  HENT:     Head: Normocephalic and atraumatic.  Neck:     Vascular: No carotid bruit.  Cardiovascular:     Rate and Rhythm: Normal rate and regular rhythm.     Pulses: Normal pulses.     Heart sounds: Normal heart sounds.  Pulmonary:     Effort: Pulmonary effort is  normal.     Breath sounds: Normal breath sounds.  Skin:    General: Skin is warm and dry.  Neurological:     General: No focal deficit present.     Mental Status: She is alert and oriented to person, place, and time.  Psychiatric:        Mood and Affect: Mood normal.        Behavior: Behavior normal.        Judgment: Judgment normal.          Assessment and Plan   1. Snoring   2. Fatigue, unspecified type   3. Witnessed episode of apnea   4. Pre-diabetes   5. Vitamin D deficiency disease   6. Essential hypertension, benign   7. Anemia, unspecified type   8. Class 3 severe obesity with serious comorbidity and body mass index (BMI) of 40.0 to 44.9 in adult, unspecified obesity type ([place])      Plan: 1.-3.  She is agreeable to calling her neurologist for assistance with scheduling her sleep study.  She has seen them within the last year so she should not need another referral, but I have offered to refer her to neurology if needed.  She will let me know if she needs this. 4.  We will check A1c today. 5.  We will check serum vitamin D and CMP today. 6., 8.  Blood pressure acceptable for right now.  As stated above she is try to make some lifestyle changes aimed at helping her with losing weight and improving her blood pressure.  We will not add an additional agent today, however if blood pressure remains borderline high may consider adding either ACE or ARB. 7.  We will check CBC, ferritin, and iron level today.   Tests ordered Orders Placed This Encounter  Procedures  . Vitamin D, 25-hydroxy  . CMP with eGFR([facility])  . Hemoglobin A1c  . CBC  . Ferritin  . Iron      No orders of the defined types were placed in this encounter.   Patient to follow-up in 3 months or sooner as needed.  Ailene Ards, NP

## 2020-11-20 ENCOUNTER — Encounter (INDEPENDENT_AMBULATORY_CARE_PROVIDER_SITE_OTHER): Payer: Self-pay | Admitting: Nurse Practitioner

## 2020-11-20 LAB — COMPLETE METABOLIC PANEL WITH GFR
AG Ratio: 1.2 (calc) (ref 1.0–2.5)
ALT: 17 U/L (ref 6–29)
AST: 14 U/L (ref 10–30)
Albumin: 3.9 g/dL (ref 3.6–5.1)
Alkaline phosphatase (APISO): 49 U/L (ref 31–125)
BUN: 12 mg/dL (ref 7–25)
CO2: 26 mmol/L (ref 20–32)
Calcium: 8.6 mg/dL (ref 8.6–10.2)
Chloride: 103 mmol/L (ref 98–110)
Creat: 0.62 mg/dL (ref 0.50–1.10)
GFR, Est African American: 132 mL/min/{1.73_m2} (ref >=60)
GFR, Est Non African American: 114 mL/min/{1.73_m2} (ref >=60)
Globulin: 3.2 g/dL (calc) (ref 1.9–3.7)
Glucose, Bld: 109 mg/dL (ref 65–139)
Potassium: 3.8 mmol/L (ref 3.5–5.3)
Sodium: 137 mmol/L (ref 135–146)
Total Bilirubin: 0.2 mg/dL (ref 0.2–1.2)
Total Protein: 7.1 g/dL (ref 6.1–8.1)

## 2020-11-20 LAB — CBC
HCT: 40.9 % (ref 35.0–45.0)
Hemoglobin: 12.8 g/dL (ref 11.7–15.5)
MCH: 25.3 pg — ABNORMAL LOW (ref 27.0–33.0)
MCHC: 31.3 g/dL — ABNORMAL LOW (ref 32.0–36.0)
MCV: 81 fL (ref 80.0–100.0)
MPV: 10.6 fL (ref 7.5–12.5)
Platelets: 253 10*3/uL (ref 140–400)
RBC: 5.05 10*6/uL (ref 3.80–5.10)
RDW: 14.9 % (ref 11.0–15.0)
WBC: 11.2 10*3/uL — ABNORMAL HIGH (ref 3.8–10.8)

## 2020-11-20 LAB — HEMOGLOBIN A1C
Hgb A1c MFr Bld: 6.6 % of total Hgb — ABNORMAL HIGH (ref <5.7)
Mean Plasma Glucose: 143 mg/dL
eAG (mmol/L): 7.9 mmol/L

## 2020-11-20 LAB — VITAMIN D 25 HYDROXY (VIT D DEFICIENCY, FRACTURES): Vit D, 25-Hydroxy: 37 ng/mL (ref 30–100)

## 2020-11-20 LAB — IRON: Iron: 62 ug/dL (ref 40–190)

## 2020-11-20 LAB — FERRITIN: Ferritin: 6 ng/mL — ABNORMAL LOW (ref 16–154)

## 2021-02-03 ENCOUNTER — Other Ambulatory Visit: Payer: Medicaid Other | Admitting: Adult Health

## 2021-02-19 ENCOUNTER — Encounter (INDEPENDENT_AMBULATORY_CARE_PROVIDER_SITE_OTHER): Payer: Self-pay | Admitting: Nurse Practitioner

## 2021-02-19 ENCOUNTER — Ambulatory Visit (INDEPENDENT_AMBULATORY_CARE_PROVIDER_SITE_OTHER): Payer: Medicaid Other | Admitting: Nurse Practitioner

## 2021-02-19 ENCOUNTER — Other Ambulatory Visit: Payer: Self-pay

## 2021-02-19 VITALS — BP 122/80 | HR 101 | Temp 97.3°F | Ht <= 58 in | Wt 214.6 lb

## 2021-02-19 DIAGNOSIS — Z0100 Encounter for examination of eyes and vision without abnormal findings: Secondary | ICD-10-CM | POA: Diagnosis not present

## 2021-02-19 DIAGNOSIS — Z13220 Encounter for screening for lipoid disorders: Secondary | ICD-10-CM | POA: Diagnosis not present

## 2021-02-19 DIAGNOSIS — Z1159 Encounter for screening for other viral diseases: Secondary | ICD-10-CM

## 2021-02-19 DIAGNOSIS — Z0001 Encounter for general adult medical examination with abnormal findings: Secondary | ICD-10-CM

## 2021-02-19 DIAGNOSIS — E119 Type 2 diabetes mellitus without complications: Secondary | ICD-10-CM | POA: Diagnosis not present

## 2021-02-19 MED ORDER — BLOOD GLUCOSE METER KIT: PACK | 6 refills | Status: AC

## 2021-02-19 NOTE — Patient Instructions (Addendum)
Goal Fasting Blood Glucose: 80-130  Goal Post-Prandial (2 hours after eating a meal) Blood Glucose: <180  Debrox: can get this over the counter to loosen ear wax. Read instructions and label for dosing directions  If you need to call a doctor outside of business hours: call [phone] press [phone] for operator and ask for on-call doctor for Dr. Anastasio Champion

## 2021-02-19 NOTE — Progress Notes (Signed)
Subjective:  Patient ID: Tara Stark, female    DOB: June 22, 1981  Age: 40 y.o. MRN: [id number]  CC:  Chief Complaint  Patient presents with   Annual Exam    Doing well      HPI  This patient arrives today for the above.  She is up-to-date with Pap smear.  She is due for eye exam, foot exam, hepatitis C screening.  She would be due for pneumonia vaccine but would like to hold off on this for now.  She is fairly newly diagnosed as diabetic.  Last A1c was collected in April of this year and it was 6.6.  Is not currently on any medication for this.  She does not check her blood sugars at home on a regular basis.  She is motivated to make lifestyle changes to hopefully better control her diabetes without medication.    Past Medical History:  Diagnosis Date   Acid indigestion    Ataxia    Diabetes mellitus, type 2 ([place])    Fibroid    Hemorrhoids    Hypertension    Thyroid disease    Vitamin D deficiency disease 04/18/2019      Family History  Problem Relation Age of Onset   Other Paternal Grandfather        MVA   Breast cancer Paternal Grandmother    Alzheimer's disease Maternal Grandmother    Cancer Maternal Grandfather        throat   Diabetes Father    Other Father        has a pacemaker   Hypertension Mother    Hypertension Brother    Hypertension Sister    Hypertension Sister    Bronchiolitis Son    Hypertension Paternal Aunt     Social History   Social History Narrative   Married since 2002.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   Social History   Tobacco Use   Smoking status: Former    Years: 4.00    Types: Cigarettes   Smokeless tobacco: Never  Substance Use Topics   Alcohol use: No     Current Meds  Medication Sig   amLODipine (NORVASC) 10 MG tablet Take 1 tablet (10 mg total) by mouth daily.   blood glucose meter kit and supplies Check your blood sugar every day before your first meal.   Cholecalciferol  (VITAMIN D3) 250 MCG (10000 UT) TABS Take 10,000 Units by mouth daily.   ferrous sulfate 325 (65 FE) MG tablet Take 1 tablet (325 mg total) by mouth 2 (two) times daily with a meal.    ROS:  Review of Systems  Eyes:  Negative for blurred vision.  Respiratory:  Negative for shortness of breath.   Cardiovascular:  Negative for chest pain and palpitations.  Gastrointestinal:  Negative for abdominal pain.  Neurological:  Negative for dizziness and headaches.    Objective:   Today's Vitals: BP 122/80   Pulse (!) 101   Temp (!) 97.3 F (36.3 C) (Temporal)   Ht _0  (1.473 m)   Wt 214 lb 9.6 oz (97.3 kg)   LMP 02/07/2021   SpO2 95%   BMI 44.85 kg/m  Vitals with BMI 02/19/2021 11/19/2020 07/08/2020  Height _1  _2  (No Data)  Weight 214 lbs 10 oz 224 lbs 10 oz (No Data)  BMI 33.43 56.86 -  Systolic 168 372 (No Data)  Diastolic 80 84 (No Data)  Pulse 101 100 -  Physical Exam Vitals reviewed.  Constitutional:      Appearance: Normal appearance.  HENT:     Head: Normocephalic and atraumatic.     Right Ear: Tympanic membrane, ear canal and external ear normal. There is impacted cerumen.     Left Ear: Tympanic membrane, ear canal and external ear normal. There is impacted cerumen.  Eyes:     General:        Right eye: No discharge.        Left eye: No discharge.     Extraocular Movements: Extraocular movements intact.     Conjunctiva/sclera: Conjunctivae normal.     Pupils: Pupils are equal, round, and reactive to light.  Neck:     Vascular: No carotid bruit.  Cardiovascular:     Rate and Rhythm: Normal rate and regular rhythm.     Pulses: Normal pulses.          Dorsalis pedis pulses are 2+ on the right side and 2+ on the left side.     Heart sounds: Normal heart sounds. No murmur heard. Pulmonary:     Effort: Pulmonary effort is normal.     Breath sounds: Normal breath sounds.  Chest:  Breasts:    Breasts are symmetrical.     Right: Normal. No supraclavicular  adenopathy.     Left: Normal. No supraclavicular adenopathy.     Comments: Nellie Hairston as chaperone Abdominal:     General: Abdomen is flat. Bowel sounds are normal. There is no distension.     Palpations: Abdomen is soft. There is no mass.     Tenderness: There is no abdominal tenderness.  Musculoskeletal:        General: No tenderness.     Cervical back: Neck supple. No muscular tenderness.     Right lower leg: No edema.     Left lower leg: No edema.     Right foot: No deformity.     Left foot: No deformity.  Feet:     Right foot:     Protective Sensation: 10 sites tested.  10 sites sensed.     Skin integrity: Skin integrity normal.     Toenail Condition: Right toenails are normal.     Left foot:     Protective Sensation: 10 sites tested.  10 sites sensed.     Skin integrity: Skin integrity normal.     Toenail Condition: Left toenails are normal.  Lymphadenopathy:     Cervical: No cervical adenopathy.     Upper Body:     Right upper body: No supraclavicular adenopathy.     Left upper body: No supraclavicular adenopathy.  Skin:    General: Skin is warm and dry.  Neurological:     General: No focal deficit present.     Mental Status: She is alert and oriented to person, place, and time.     Motor: No weakness.     Gait: Gait normal.  Psychiatric:        Mood and Affect: Mood normal.        Behavior: Behavior normal.        Judgment: Judgment normal.         Assessment and Plan   1. Encounter for general adult medical examination with abnormal findings   2. Controlled type 2 diabetes mellitus without complication, without long-term current use of insulin ([place])   3. Eye exam, routine   4. Screening, lipid   5. Encounter for hepatitis C screening test for low  risk patient      Plan: 1., 3.  Will order hepatitis C screening, foot exam completed today, will refer patient to eye doctor for diabetic eye exam.  She will be due for mammogram later on this year she  turns 43 in September.  We will send reminder in system deceiving get her scheduled closer to that time.  We will hold off on pneumonia vaccine but I did encourage her to consider getting this administered around flu season and she will consider this.  We did discuss that she is due for COVID-19 booster and she will consider this as well. 2.  Have ordered glucometer with supplies if she can start checking a daily fasting blood sugar.  We will also check A1c today.  I encouraged her to make lifestyle changes aimed at helping her with weight loss to hopefully lower her A1c and she plans on doing this.  We did discuss signs and symptoms of hypo and hyperglycemia and what to do if these were to occur.  We also discussed blood sugar goals. 4.  We will check lipid panel today. 5.  We will check hepatitis C screening.  Tests ordered Orders Placed This Encounter  Procedures   Hemoglobin A1c   Microalbumin/Creatinine Ratio, Urine   Lipid Panel   CMP with eGFR([facility])   Hep C Antibody   Ambulatory referral to Optometry      Meds ordered this encounter  Medications   blood glucose meter kit and supplies    Sig: Check your blood sugar every day before your first meal.    Dispense:  1 each    Refill:  6    Order Specific Question:   Supervising Provider    Answer:   Doree Albee [[id number]]    Order Specific Question:   Number of strips    Answer:   100    Order Specific Question:   Number of lancets    Answer:   100    Patient to follow-up in 3 to 6 months or sooner as needed.  In addition to her annual physical exam I also performed an office visit to address her diabetes.  Ailene Ards, NP

## 2021-02-20 LAB — LIPID PANEL
Cholesterol: 163 mg/dL (ref <200)
HDL: 59 mg/dL (ref >=50)
LDL Cholesterol (Calc): 88 mg/dL (calc)
Non-HDL Cholesterol (Calc): 104 mg/dL (calc) (ref <130)
Total CHOL/HDL Ratio: 2.8 (calc) (ref <5.0)
Triglycerides: 71 mg/dL (ref <150)

## 2021-02-20 LAB — COMPLETE METABOLIC PANEL WITH GFR
AG Ratio: 1.3 (calc) (ref 1.0–2.5)
ALT: 17 U/L (ref 6–29)
AST: 14 U/L (ref 10–30)
Albumin: 4.1 g/dL (ref 3.6–5.1)
Alkaline phosphatase (APISO): 52 U/L (ref 31–125)
BUN: 9 mg/dL (ref 7–25)
CO2: 28 mmol/L (ref 20–32)
Calcium: 9.1 mg/dL (ref 8.6–10.2)
Chloride: 102 mmol/L (ref 98–110)
Creat: 0.55 mg/dL (ref 0.50–0.97)
Globulin: 3.1 g/dL (calc) (ref 1.9–3.7)
Glucose, Bld: 134 mg/dL (ref 65–139)
Potassium: 3.8 mmol/L (ref 3.5–5.3)
Sodium: 139 mmol/L (ref 135–146)
Total Bilirubin: 0.3 mg/dL (ref 0.2–1.2)
Total Protein: 7.2 g/dL (ref 6.1–8.1)
eGFR: 120 mL/min/{1.73_m2} (ref >=60)

## 2021-02-20 LAB — HEMOGLOBIN A1C
Hgb A1c MFr Bld: 6.2 % of total Hgb — ABNORMAL HIGH (ref <5.7)
Mean Plasma Glucose: 131 mg/dL
eAG (mmol/L): 7.3 mmol/L

## 2021-02-20 LAB — MICROALBUMIN / CREATININE URINE RATIO
Creatinine, Urine: 166 mg/dL (ref 20–275)
Microalb Creat Ratio: 28 mcg/mg creat (ref <30)
Microalb, Ur: 4.6 mg/dL

## 2021-02-20 LAB — HEPATITIS C ANTIBODY
Hepatitis C Ab: NONREACTIVE
SIGNAL TO CUT-OFF: 0.01 (ref <1.00)

## 2021-05-02 ENCOUNTER — Encounter: Payer: Self-pay | Admitting: Nurse Practitioner

## 2021-07-07 ENCOUNTER — Ambulatory Visit (INDEPENDENT_AMBULATORY_CARE_PROVIDER_SITE_OTHER): Payer: Medicaid Other | Admitting: Internal Medicine

## 2021-08-07 ENCOUNTER — Other Ambulatory Visit: Payer: Self-pay

## 2021-08-07 ENCOUNTER — Encounter: Payer: Self-pay | Admitting: Emergency Medicine

## 2021-08-07 ENCOUNTER — Ambulatory Visit
Admission: EM | Admit: 2021-08-07 | Discharge: 2021-08-07 | Disposition: A | Discharge status: Home / Self Care | Payer: Medicaid Other | Attending: Urgent Care | Admitting: Urgent Care

## 2021-08-07 DIAGNOSIS — H6983 Other specified disorders of Eustachian tube, bilateral: Secondary | ICD-10-CM | POA: Diagnosis not present

## 2021-08-07 DIAGNOSIS — I10 Essential (primary) hypertension: Secondary | ICD-10-CM | POA: Diagnosis not present

## 2021-08-07 MED ORDER — AMLODIPINE BESYLATE 5 MG PO TABS: 5.0000 mg | ORAL_TABLET | Freq: Every day | ORAL | 0 refills | Status: DC

## 2021-08-07 MED ORDER — PSEUDOEPHEDRINE HCL 30 MG PO TABS: 30.0000 mg | ORAL_TABLET | Freq: Three times a day (TID) | ORAL | 0 refills | Status: DC | PRN

## 2021-08-07 MED ORDER — LEVOCETIRIZINE DIHYDROCHLORIDE 5 MG PO TABS
5.0000 mg | ORAL_TABLET | Freq: Every evening | ORAL | 0 refills | Status: AC
Start: 2021-08-07 — End: ?

## 2021-08-07 MED ORDER — FLUTICASONE PROPIONATE 50 MCG/ACT NA SUSP: 2.0000 | Freq: Every day | NASAL | 12 refills | Status: AC

## 2021-08-07 NOTE — ED Provider Notes (Signed)
[place]   MRN: [id number] DOB: 1980/08/25  Subjective:   Tara Stark is a 41 y.o. female presenting for 2-week history of persistent bilateral ear fullness, hearing loss.  Initially she did have some runny and stuffy nose, drainage, sick symptoms but this is all resolved.  She does have a history of essential hypertension and would like to consider starting her blood pressure medications again.  She was supposed to be weaned off of them but her blood pressure has been up.  Denies headache, confusion, weakness, numbness or tingling, chest pain, heart racing, shortness of breath, nausea, vomiting, abdominal pain, diaphoresis.  She is not a smoker.  No current facility-administered medications for this encounter.  Current Outpatient Medications:    amLODipine (NORVASC) 10 MG tablet, Take 1 tablet (10 mg total) by mouth daily., Disp: 90 tablet, Rfl: 3   blood glucose meter kit and supplies, Check your blood sugar every day before your first meal., Disp: 1 each, Rfl: 6   Cholecalciferol (VITAMIN D3) 250 MCG (10000 UT) TABS, Take 10,000 Units by mouth daily., Disp: , Rfl:    ferrous sulfate 325 (65 FE) MG tablet, Take 1 tablet (325 mg total) by mouth 2 (two) times daily with a meal., Disp: 60 tablet, Rfl: 1   No Known Allergies  Past Medical History:  Diagnosis Date   Acid indigestion    Ataxia    Diabetes mellitus, type 2 ([facility])    Fibroid    Hemorrhoids    Hypertension    Thyroid disease    Vitamin D deficiency disease 04/18/2019     Past Surgical History:  Procedure Laterality Date   CESAREAN SECTION N/A 10/23/2019   Procedure: CESAREAN SECTION;  Surgeon: Osborne Oman, MD;  Location: [facility];  Service: Obstetrics;  Laterality: N/A;  Urgent Abruption   TUBAL LIGATION      Family History  Problem Relation Age of Onset   Other Paternal Grandfather        MVA   Breast cancer Paternal Grandmother    Alzheimer's disease Maternal Grandmother    Cancer  Maternal Grandfather        throat   Diabetes Father    Other Father        has a pacemaker   Hypertension Mother    Hypertension Brother    Hypertension Sister    Hypertension Sister    Bronchiolitis Son    Hypertension Paternal Aunt     Social History   Tobacco Use   Smoking status: Former    Years: 4.00    Types: Cigarettes   Smokeless tobacco: Never  Vaping Use   Vaping Use: Never used  Substance Use Topics   Alcohol use: No   Drug use: No    ROS   Objective:   Vitals: BP (!) 155/92 (BP Location: Right Arm)    Pulse 92    Temp 98.6 F (37 C) (Oral)    Resp 18    LMP 07/10/2021 (Approximate)    SpO2 95%   BP Readings from Last 3 Encounters:  08/07/21 (!) 155/92  02/19/21 122/80  11/19/20 136/84   Physical Exam Constitutional:      General: She is not in acute distress.    Appearance: Normal appearance. She is well-developed and normal weight. She is not ill-appearing, toxic-appearing or diaphoretic.  HENT:     Head: Normocephalic and atraumatic.     Right Ear: Tympanic membrane, ear canal and external ear normal. No  drainage or tenderness. No middle ear effusion. There is no impacted cerumen. Tympanic membrane is not erythematous.     Left Ear: Tympanic membrane, ear canal and external ear normal. No drainage or tenderness.  No middle ear effusion. There is no impacted cerumen. Tympanic membrane is not erythematous.     Nose: Nose normal. No congestion or rhinorrhea.     Mouth/Throat:     Mouth: Mucous membranes are moist. No oral lesions.     Pharynx: No pharyngeal swelling, oropharyngeal exudate, posterior oropharyngeal erythema or uvula swelling.     Tonsils: No tonsillar exudate or tonsillar abscesses.  Eyes:     General: No scleral icterus.       Right eye: No discharge.        Left eye: No discharge.     Extraocular Movements: Extraocular movements intact.     Right eye: Normal extraocular motion.     Left eye: Normal extraocular motion.      Conjunctiva/sclera: Conjunctivae normal.  Cardiovascular:     Rate and Rhythm: Normal rate and regular rhythm.     Pulses: Normal pulses.     Heart sounds: Normal heart sounds. No murmur heard.   No friction rub. No gallop.  Pulmonary:     Effort: Pulmonary effort is normal. No respiratory distress.     Breath sounds: Normal breath sounds. No stridor. No wheezing, rhonchi or rales.  Musculoskeletal:     Cervical back: Normal range of motion and neck supple.  Lymphadenopathy:     Cervical: No cervical adenopathy.  Skin:    General: Skin is warm and dry.     Findings: No rash.  Neurological:     General: No focal deficit present.     Mental Status: She is alert and oriented to person, place, and time.     Cranial Nerves: No cranial nerve deficit.     Motor: No weakness.     Coordination: Coordination normal.     Gait: Gait normal.     Comments: Negative Romberg and pronator drift, no facial asymmetry.   Psychiatric:        Mood and Affect: Mood normal.        Behavior: Behavior normal.        Thought Content: Thought content normal.        Judgment: Judgment normal.    Assessment and Plan :   PDMP not reviewed this encounter.  1. Eustachian tube dysfunction, bilateral   2. Essential hypertension   3. Essential hypertension, benign    Unremarkable ENT exam.  Will use conservative management for what I suspect is eustachian tube dysfunction.  Recommended starting Flonase, Zyrtec, Sudafed.  Will use low-dose pseudoephedrine due to her high blood pressure.  Restarted her on amlodipine.  Recommended hypertensive friendly diet.  Follow-up with new PCP.  Counseled patient on potential for adverse effects with medications prescribed/recommended today, ER and return-to-clinic precautions discussed, patient verbalized understanding.     Jaynee Eagles, PA-C 08/07/21 1121

## 2021-08-07 NOTE — Discharge Instructions (Signed)

## 2021-08-07 NOTE — ED Triage Notes (Signed)
States she feels her hearing is gone.  Hard time hearing things x 2 weeks.  Also concerned about high blood pressure

## 2021-09-18 ENCOUNTER — Other Ambulatory Visit: Payer: Self-pay

## 2021-09-18 ENCOUNTER — Ambulatory Visit
Admission: EM | Admit: 2021-09-18 | Discharge: 2021-09-18 | Disposition: A | Discharge status: Home / Self Care | Payer: Medicaid Other | Attending: Urgent Care | Admitting: Urgent Care

## 2021-09-18 ENCOUNTER — Encounter: Payer: Self-pay | Admitting: Emergency Medicine

## 2021-09-18 DIAGNOSIS — R030 Elevated blood-pressure reading, without diagnosis of hypertension: Secondary | ICD-10-CM

## 2021-09-18 DIAGNOSIS — I10 Essential (primary) hypertension: Secondary | ICD-10-CM

## 2021-09-18 MED ORDER — AMLODIPINE BESYLATE 5 MG PO TABS: 5.0000 mg | ORAL_TABLET | Freq: Every day | ORAL | 0 refills | Status: DC

## 2021-09-18 NOTE — ED Provider Notes (Signed)
[facility]   MRN: [id number] DOB: Nov 30, 1980  Subjective:   Tara Stark is a 41 y.o. female presenting for medication refill of amlodipine.  She is at 5 mg and is out of her medication.  She plans on setting up care soon as possible.  Denies headache, confusion, dizziness, chest pain, nausea, vomiting, abdominal pain, hematuria.  She is trying to do better with her diet.  No current facility-administered medications for this encounter.  Current Outpatient Medications:    amLODipine (NORVASC) 5 MG tablet, Take 1 tablet (5 mg total) by mouth daily., Disp: 90 tablet, Rfl: 0   blood glucose meter kit and supplies, Check your blood sugar every day before your first meal., Disp: 1 each, Rfl: 6   Cholecalciferol (VITAMIN D3) 250 MCG (10000 UT) TABS, Take 10,000 Units by mouth daily., Disp: , Rfl:    ferrous sulfate 325 (65 FE) MG tablet, Take 1 tablet (325 mg total) by mouth 2 (two) times daily with a meal., Disp: 60 tablet, Rfl: 1   fluticasone (FLONASE) 50 MCG/ACT nasal spray, Place 2 sprays into both nostrils daily., Disp: 16 g, Rfl: 12   levocetirizine (XYZAL) 5 MG tablet, Take 1 tablet (5 mg total) by mouth every evening., Disp: 90 tablet, Rfl: 0   pseudoephedrine (SUDAFED) 30 MG tablet, Take 1 tablet (30 mg total) by mouth every 8 (eight) hours as needed for congestion., Disp: 30 tablet, Rfl: 0   No Known Allergies  Past Medical History:  Diagnosis Date   Acid indigestion    Ataxia    Diabetes mellitus, type 2 ([place])    Fibroid    Hemorrhoids    Hypertension    Thyroid disease    Vitamin D deficiency disease 04/18/2019     Past Surgical History:  Procedure Laterality Date   CESAREAN SECTION N/A 10/23/2019   Procedure: CESAREAN SECTION;  Surgeon: Osborne Oman, MD;  Location: [facility];  Service: Obstetrics;  Laterality: N/A;  Urgent Abruption   TUBAL LIGATION      Family History  Problem Relation Age of Onset   Other Paternal Grandfather        MVA    Breast cancer Paternal Grandmother    Alzheimer's disease Maternal Grandmother    Cancer Maternal Grandfather        throat   Diabetes Father    Other Father        has a pacemaker   Hypertension Mother    Hypertension Brother    Hypertension Sister    Hypertension Sister    Bronchiolitis Son    Hypertension Paternal Aunt     Social History   Tobacco Use   Smoking status: Former    Years: 4.00    Types: Cigarettes   Smokeless tobacco: Never  Vaping Use   Vaping Use: Never used  Substance Use Topics   Alcohol use: No   Drug use: No    ROS   Objective:   Vitals: BP (!) 145/84 (BP Location: Right Arm)    Pulse 97    Temp 98.1 F (36.7 C) (Oral)    Resp 18    LMP 08/31/2021 (Approximate)    SpO2 95%   Physical Exam Constitutional:      General: She is not in acute distress.    Appearance: Normal appearance. She is well-developed. She is not ill-appearing, toxic-appearing or diaphoretic.  HENT:     Head: Normocephalic and atraumatic.     Nose: Nose normal.  Mouth/Throat:     Mouth: Mucous membranes are moist.  Eyes:     General: No scleral icterus.       Right eye: No discharge.        Left eye: No discharge.     Extraocular Movements: Extraocular movements intact.  Cardiovascular:     Rate and Rhythm: Normal rate.  Pulmonary:     Effort: Pulmonary effort is normal.  Skin:    General: Skin is warm and dry.  Neurological:     General: No focal deficit present.     Mental Status: She is alert and oriented to person, place, and time.  Psychiatric:        Mood and Affect: Mood normal.        Behavior: Behavior normal.    Assessment and Plan :   PDMP not reviewed this encounter.  1. Essential hypertension   2. Elevated blood pressure reading   3. Essential hypertension, benign    Asymptomatic hypertension.  Needs a medication refill of amlodipine at 5 mg.  Encouraged patient to continue hypertensive friendly diet.  Also demonstrated how to set up  an appointment online through [facility]. Counseled patient on potential for adverse effects with medications prescribed/recommended today, ER and return-to-clinic precautions discussed, patient verbalized understanding.    Jaynee Eagles, PA-C 09/18/21 1108

## 2021-09-18 NOTE — ED Triage Notes (Signed)
Needs refill on blood pressure medication.  Amlodipine 5 mg

## 2021-11-02 ENCOUNTER — Ambulatory Visit
Admission: EM | Admit: 2021-11-02 | Discharge: 2021-11-02 | Disposition: A | Discharge status: Home / Self Care | Payer: Medicaid Other | Attending: Family Medicine | Admitting: Family Medicine

## 2021-11-02 DIAGNOSIS — L989 Disorder of the skin and subcutaneous tissue, unspecified: Secondary | ICD-10-CM | POA: Diagnosis not present

## 2021-11-02 MED ORDER — CLOBETASOL PROPIONATE 0.05 % EX OINT
1.0000 | TOPICAL_OINTMENT | Freq: Two times a day (BID) | CUTANEOUS | 0 refills | Status: AC
Start: 2021-11-02 — End: ?

## 2021-11-02 NOTE — ED Triage Notes (Signed)
Pt states she had some bumps pop up on both index finger on both hands ? ?Denies Fever ?

## 2021-11-02 NOTE — ED Provider Notes (Signed)
?[place] ? ? ? ?CSN: [id number] ?Arrival date & time: 11/02/21  1135 ? ? ?  ? ?History   ?Chief Complaint ?Chief Complaint  ?Patient presents with  ? Hand Problem  ?  Bumps on hand  ? ? ?HPI ?Tara Stark is a 41 y.o. female.  ? ?Presenting today with about a week or more of a bump to  PIP on palmar surface of index fingers bilaterally.  She states the areas feel like calluses and are firm, painful with bending the finger or bumping the area.  No discoloration, drainage, fluctuance, fever, chills, injury to the areas, known exposures to anything.  Has not been trying anything other than Vaseline for symptoms with no relief. ? ? ?Past Medical History:  ?Diagnosis Date  ? Acid indigestion   ? Ataxia   ? Diabetes mellitus, type 2 ([place])   ? Fibroid   ? Hemorrhoids   ? Hypertension   ? Thyroid disease   ? Vitamin D deficiency disease 04/18/2019  ? ? ?Patient Active Problem List  ? Diagnosis Date Noted  ? Controlled type 2 diabetes mellitus without complication, without long-term current use of insulin ([place]) 11/19/2020  ? Anemia 11/19/2020  ? Class 3 severe obesity with serious comorbidity and body mass index (BMI) of 40.0 to 44.9 in adult [facility]) 11/19/2020  ? Severe preeclampsia 10/23/2019  ? S/P cesarean section for arrest of descent 10/23/2019  ? Placental abruption, delivered 10/23/2019  ? Essential hypertension, benign 05/17/2019  ? Vitamin D deficiency disease 04/18/2019  ? Severe preeclampsia superimposed on chronic hypertension, delivered 10/04/2018  ? Uterine fibroid 04/21/2018  ? History of prior pregnancy with IUGR newborn 04/15/2018  ? History of cervical cerclage 03/09/2018  ? ? ?Past Surgical History:  ?Procedure Laterality Date  ? CESAREAN SECTION N/A 10/23/2019  ? Procedure: CESAREAN SECTION;  Surgeon: Osborne Oman, MD;  Location: [facility] LD [facility];  Service: Obstetrics;  Laterality: N/A;  Urgent Abruption  ? TUBAL LIGATION    ? ? ?OB History   ? ? Gravida  ?3  ? Para  ?3  ? Term  ?3  ? Preterm   ?0  ? AB  ?0  ? Living  ?3  ?  ? ? SAB  ?0  ? IAB  ?0  ? Ectopic  ?0  ? Multiple  ?0  ? Live Births  ?3  ?   ?  ?  ? ? ? ?Home Medications   ? ?Prior to Admission medications   ?Medication Sig Start Date End Date Taking? Authorizing Provider  ?clobetasol ointment (TEMOVATE) 3.87 % Apply 1 application. topically 2 (two) times daily. 11/02/21  Yes Volney American, PA-C  ?amLODipine (NORVASC) 5 MG tablet Take 1 tablet (5 mg total) by mouth daily. 09/18/21   Jaynee Eagles, PA-C  ?blood glucose meter kit and supplies Check your blood sugar every day before your first meal. 02/19/21   Ailene Ards, NP  ?Cholecalciferol (VITAMIN D3) 250 MCG (10000 UT) TABS Take 10,000 Units by mouth daily.    [provider]  ?ferrous sulfate 325 (65 FE) MG tablet Take 1 tablet (325 mg total) by mouth 2 (two) times daily with a meal. 10/26/19   Anyanwu, Sallyanne Havers, MD  ?fluticasone (FLONASE) 50 MCG/ACT nasal spray Place 2 sprays into both nostrils daily. 08/07/21   Jaynee Eagles, PA-C  ?levocetirizine (XYZAL) 5 MG tablet Take 1 tablet (5 mg total) by mouth every evening. 08/07/21   Jaynee Eagles, PA-C  ?  pseudoephedrine (SUDAFED) 30 MG tablet Take 1 tablet (30 mg total) by mouth every 8 (eight) hours as needed for congestion. 08/07/21   Jaynee Eagles, PA-C  ? ? ?Family History ?Family History  ?Problem Relation Age of Onset  ? Other Paternal Grandfather   ?     MVA  ? Breast cancer Paternal Grandmother   ? Alzheimer's disease Maternal Grandmother   ? Cancer Maternal Grandfather   ?     throat  ? Diabetes Father   ? Other Father   ?     has a pacemaker  ? Hypertension Mother   ? Hypertension Brother   ? Hypertension Sister   ? Hypertension Sister   ? Bronchiolitis Son   ? Hypertension Paternal Aunt   ? ? ?Social History ?Social History  ? ?Tobacco Use  ? Smoking status: Former  ?  Years: 4.00  ?  Types: Cigarettes  ? Smokeless tobacco: Never  ?Vaping Use  ? Vaping Use: Never used  ?Substance Use Topics  ? Alcohol use: No  ? Drug use: No   ? ? ? ?Allergies   ?Patient has no known allergies. ? ? ?Review of Systems ?Review of Systems ?Per HPI ? ?Physical Exam ?Triage Vital Signs ?ED Triage Vitals  ?Enc Vitals Group  ?   BP 11/02/21 1224 134/85  ?   Pulse Rate 11/02/21 1224 83  ?   Resp 11/02/21 1224 18  ?   Temp 11/02/21 1224 98.6 ?F (37 ?C)  ?   Temp Source 11/02/21 1224 Oral  ?   SpO2 11/02/21 1224 96 %  ?   Weight --   ?   Height --   ?   Head Circumference --   ?   Peak Flow --   ?   Pain Score 11/02/21 1220 4  ?   Pain Loc --   ?   Pain Edu? --   ?   Excl. in [place]? --   ? ?No data found. ? ?Updated Vital Signs ?BP 134/85 (BP Location: Right Arm)   Pulse 83   Temp 98.6 ?F (37 ?C) (Oral)   Resp 18   LMP 10/17/2021 (Approximate)   SpO2 96%   Breastfeeding No  ? ?Visual Acuity ?Right Eye Distance:   ?Left Eye Distance:   ?Bilateral Distance:   ? ?Right Eye Near:   ?Left Eye Near:    ?Bilateral Near:    ? ?Physical Exam ?Vitals and nursing note reviewed.  ?Constitutional:   ?   Appearance: Normal appearance. She is not ill-appearing.  ?HENT:  ?   Head: Atraumatic.  ?Eyes:  ?   Extraocular Movements: Extraocular movements intact.  ?   Conjunctiva/sclera: Conjunctivae normal.  ?Cardiovascular:  ?   Rate and Rhythm: Normal rate and regular rhythm.  ?   Heart sounds: Normal heart sounds.  ?Pulmonary:  ?   Effort: Pulmonary effort is normal.  ?   Breath sounds: Normal breath sounds.  ?Musculoskeletal:     ?   General: Normal range of motion.  ?   Cervical back: Normal range of motion and neck supple.  ?Skin: ?   General: Skin is warm and dry.  ?   Findings: No erythema.  ?   Comments: Nodular flesh-colored firm papular lesions to each index finger PIP on the palmar surface, small central induration.  No fluctuance, drainage.  Minimally tender to palpation  ?Neurological:  ?   Mental Status: She is alert and oriented to person, place,  and time.  ?Psychiatric:     ?   Mood and Affect: Mood normal.     ?   Thought Content: Thought content normal.     ?    Judgment: Judgment normal.  ? ? ? ?UC Treatments / Results  ?Labs ?(all labs ordered are listed, but only abnormal results are displayed) ?Labs Reviewed - No data to display ? ?EKG ? ? ?Radiology ?No results found. ? ?Procedures ?Procedures (including critical care time) ? ?Medications Ordered in UC ?Medications - No data to display ? ?Initial Impression / Assessment and Plan / UC Course  ?I have reviewed the triage vital signs and the nursing notes. ? ?Pertinent labs & imaging results that were available during my care of the patient were reviewed by me and considered in my medical decision making (see chart for details). ? ?  ? ?Unclear if representing a warty or molluscum type lesion, treat with clobetasol twice daily to see if this softens the inflammatory tissue.  Follow-up with dermatology if not resolving. ? ?Final Clinical Impressions(s) / UC Diagnoses  ? ?Final diagnoses:  ?Hand lesion  ? ?Discharge Instructions   ?None ?  ? ?ED Prescriptions   ? ? Medication Sig Dispense Auth. Provider  ? clobetasol ointment (TEMOVATE) 4.07 % Apply 1 application. topically 2 (two) times daily. 60 g Volney American, [place]  ? ?  ? ?PDMP not reviewed this encounter. ?  ?Volney American, PA-C ?11/02/21 1305 ? ?

## 2021-11-27 ENCOUNTER — Emergency Department (HOSPITAL_COMMUNITY)
Admission: EM | Admit: 2021-11-27 | Discharge: 2021-11-27 | Disposition: A | Discharge status: Home / Self Care | Payer: Medicaid Other | Attending: Emergency Medicine | Admitting: Emergency Medicine

## 2021-11-27 ENCOUNTER — Encounter (HOSPITAL_COMMUNITY): Payer: Self-pay

## 2021-11-27 ENCOUNTER — Other Ambulatory Visit: Payer: Self-pay

## 2021-11-27 DIAGNOSIS — A630 Anogenital (venereal) warts: Secondary | ICD-10-CM | POA: Diagnosis not present

## 2021-11-27 DIAGNOSIS — B079 Viral wart, unspecified: Secondary | ICD-10-CM | POA: Diagnosis not present

## 2021-11-27 DIAGNOSIS — I10 Essential (primary) hypertension: Secondary | ICD-10-CM | POA: Diagnosis not present

## 2021-11-27 DIAGNOSIS — M7989 Other specified soft tissue disorders: Secondary | ICD-10-CM | POA: Diagnosis present

## 2021-11-27 DIAGNOSIS — Z79899 Other long term (current) drug therapy: Secondary | ICD-10-CM | POA: Insufficient documentation

## 2021-11-27 NOTE — Discharge Instructions (Addendum)
At the pharmacy in the section that treats warts she will find a compressed liquid called ethyl ether, this will come in a box that has some tools to help you treat this, follow the instructions, if this does not help over the month, you will need to see a dermatologist ? ?You may call the office of Dr. Nevada Crane at [phone] ?[address] [place] ? ?You can make an appointment for the next 1 to 2 months ?

## 2021-11-27 NOTE — ED Provider Notes (Signed)
?[place] ?Provider Note ? ? ?CSN: [id number] ?Arrival date & time: 11/27/21  1723 ? ?  ? ?History ? ?Chief Complaint  ?Patient presents with  ? Wound Check  ? ? ?Tara Stark is a 41 y.o. female. ? ? ?Wound Check ? ? ?This patient is a 41 year old female, she presents to the hospital with bilateral index finger discomfort and swelling.  Has been going on for at least a month, she has already been seen at the urgent care, was given a prescription for clobetasol ointment, this has not helped, she has not followed up with dermatology ? ?Home Medications ?Prior to Admission medications   ?Medication Sig Start Date End Date Taking? Authorizing Provider  ?amLODipine (NORVASC) 5 MG tablet Take 1 tablet (5 mg total) by mouth daily. 09/18/21   Jaynee Eagles, PA-C  ?blood glucose meter kit and supplies Check your blood sugar every day before your first meal. 02/19/21   Ailene Ards, NP  ?Cholecalciferol (VITAMIN D3) 250 MCG (10000 UT) TABS Take 10,000 Units by mouth daily.    [provider]  ?clobetasol ointment (TEMOVATE) 4.48 % Apply 1 application. topically 2 (two) times daily. 11/02/21   Volney American, PA-C  ?ferrous sulfate 325 (65 FE) MG tablet Take 1 tablet (325 mg total) by mouth 2 (two) times daily with a meal. 10/26/19   Anyanwu, Sallyanne Havers, MD  ?fluticasone (FLONASE) 50 MCG/ACT nasal spray Place 2 sprays into both nostrils daily. 08/07/21   Jaynee Eagles, PA-C  ?levocetirizine (XYZAL) 5 MG tablet Take 1 tablet (5 mg total) by mouth every evening. 08/07/21   Jaynee Eagles, PA-C  ?pseudoephedrine (SUDAFED) 30 MG tablet Take 1 tablet (30 mg total) by mouth every 8 (eight) hours as needed for congestion. 08/07/21   Jaynee Eagles, PA-C  ?   ? ?Allergies    ?Patient has no known allergies.   ? ?Review of Systems   ?Review of Systems  ?Constitutional:  Negative for fever.  ?Skin:  Positive for rash.  ? ?Physical Exam ?Updated Vital Signs ?BP 134/83   Pulse 87   Temp 98.6 ?F (37 ?C) (Oral)    Resp 18   Ht 1.499 m (4' 11")   Wt 90.7 kg   SpO2 99%   BMI 40.40 kg/m?  ?Physical Exam ?Vitals and nursing note reviewed.  ?Constitutional:   ?   Appearance: She is well-developed. She is not diaphoretic.  ?HENT:  ?   Head: Normocephalic and atraumatic.  ?Eyes:  ?   General:     ?   Right eye: No discharge.     ?   Left eye: No discharge.  ?   Conjunctiva/sclera: Conjunctivae normal.  ?Pulmonary:  ?   Effort: Pulmonary effort is normal. No respiratory distress.  ?Skin: ?   General: Skin is warm and dry.  ?   Findings: No erythema or rash.  ?   Comments: There appears to be 2 fleshy papular lesions on the volar surface of the bilateral index fingers over the IP joint, minimally tender, no drainage, no redness, full range of motion of the joints  ?Neurological:  ?   Mental Status: She is alert.  ?   Coordination: Coordination normal.  ? ? ?ED Results / Procedures / Treatments   ?Labs ?(all labs ordered are listed, but only abnormal results are displayed) ?Labs Reviewed - No data to display ? ?EKG ?None ? ?Radiology ?No results found. ? ?Procedures ?Procedures  ? ? ?Medications Ordered  in ED ?Medications - No data to display ? ?ED Course/ Medical Decision Making/ A&P ?  ?                        ?Medical Decision Making ? ?Warts ?Recommended cryotherapy ?Follow-up with dermatology ?Patient agreeable, stable for discharge, no signs of abscess or cellulitis ? ? ? ? ? ? ? ?Final Clinical Impression(s) / ED Diagnoses ?Final diagnoses:  ?Viral warts, unspecified type  ? ? ?Rx / DC Orders ?ED Discharge Orders   ? ? None  ? ?  ? ? ?  ?Miller, Brian, MD ?11/27/21 1913 ? ?

## 2021-11-27 NOTE — ED Triage Notes (Signed)
Pt has a hardened area on both second digits that has been there x 1 month.  Pt reports pain to the areas.  Denies drainage.  Resp even and unlabored.  Skin warm and dry.  nad ?

## 2021-11-28 ENCOUNTER — Telehealth: Payer: Self-pay

## 2021-11-28 NOTE — Telephone Encounter (Signed)
Transition Care Management Unsuccessful Follow-up Telephone Call ? ?Date of discharge and from where:  [facility]27/20[facility]  ? ?Attempts:  1st Attempt ? ?Reason for unsuccessful TCM follow-up call:  Left voice message ? ?  ?

## 2021-12-01 NOTE — Telephone Encounter (Signed)
Transition Care Management Unsuccessful Follow-up Telephone Call ? ?Date of discharge and from where:  [facility]/27/20[facility]  ? ?Attempts:  2nd Attempt ? ?Reason for unsuccessful TCM follow-up call:  Left voice message ? ? ? ?

## 2021-12-02 NOTE — Telephone Encounter (Signed)
Transition Care Management Unsuccessful Follow-up Telephone Call ? ?Date of discharge and from where:  11/27/2021[facility]  ? ?Attempts:  3rd Attempt ? ?Reason for unsuccessful TCM follow-up call:  Unable to reach patient ? ? ? ?

## 2022-07-09 DIAGNOSIS — H01006 Unspecified blepharitis left eye, unspecified eyelid: Secondary | ICD-10-CM | POA: Diagnosis not present

## 2022-07-09 DIAGNOSIS — Z6835 Body mass index (BMI) 35.0-35.9, adult: Secondary | ICD-10-CM | POA: Diagnosis not present

## 2022-07-09 DIAGNOSIS — E669 Obesity, unspecified: Secondary | ICD-10-CM | POA: Diagnosis not present

## 2022-09-24 ENCOUNTER — Encounter: Payer: Self-pay | Admitting: Radiology

## 2022-12-27 ENCOUNTER — Ambulatory Visit (INDEPENDENT_AMBULATORY_CARE_PROVIDER_SITE_OTHER): Payer: Medicaid Other

## 2022-12-27 ENCOUNTER — Ambulatory Visit
Admission: EM | Admit: 2022-12-27 | Discharge: 2022-12-27 | Disposition: A | Discharge status: Home / Self Care | Payer: Medicaid Other | Attending: Nurse Practitioner | Admitting: Nurse Practitioner

## 2022-12-27 ENCOUNTER — Other Ambulatory Visit: Payer: Self-pay

## 2022-12-27 DIAGNOSIS — S9031XA Contusion of right foot, initial encounter: Secondary | ICD-10-CM

## 2022-12-27 DIAGNOSIS — M79671 Pain in right foot: Secondary | ICD-10-CM | POA: Diagnosis not present

## 2022-12-27 DIAGNOSIS — I10 Essential (primary) hypertension: Secondary | ICD-10-CM | POA: Diagnosis not present

## 2022-12-27 DIAGNOSIS — M7731 Calcaneal spur, right foot: Secondary | ICD-10-CM | POA: Diagnosis not present

## 2022-12-27 DIAGNOSIS — R000 Tachycardia, unspecified: Secondary | ICD-10-CM

## 2022-12-27 MED ORDER — AMLODIPINE BESYLATE 5 MG PO TABS: 5.0000 mg | ORAL_TABLET | Freq: Every day | ORAL | 0 refills | Status: AC

## 2022-12-27 NOTE — ED Provider Notes (Signed)
[facility]    CSN: [id number] Arrival date & time: 12/27/22  1114      History   Chief Complaint Chief Complaint  Patient presents with   Toe Injury    HPI Tara Stark is a 42 y.o. female.   Patient presents today for right great toe and second toe pain after she hit the area on a piece of hard furniture.  Reports she is not having pain in her foot and in the bottom of her foot, especially with walking.  Reports it is hard for her to bend her toes.  She denies any numbness or tingling in the toes or skin discoloration.  No fevers or nausea/vomiting since the pain began.  Blood pressure is elevated today in urgent care.  Patient reports she stopped taking blood pressure medication a few months ago.  She denies chest pain, shortness of breath, dizziness or lightheadedness, vision changes, headache, and lower extremity swelling today.    Past Medical History:  Diagnosis Date   Acid indigestion    Ataxia    Diabetes mellitus, type 2 ([facility])    Fibroid    Hemorrhoids    Hypertension    Thyroid disease    Vitamin D deficiency disease 04/18/2019    Patient Active Problem List   Diagnosis Date Noted   Controlled type 2 diabetes mellitus without complication, without long-term current use of insulin ([facility]) 11/19/2020   Anemia 11/19/2020   Class 3 severe obesity with serious comorbidity and body mass index (BMI) of 40.0 to 44.9 in adult [facility]) 11/19/2020   Severe preeclampsia 10/23/2019   S/P cesarean section for arrest of descent 10/23/2019   Placental abruption, delivered 10/23/2019   Essential hypertension, benign 05/17/2019   Vitamin D deficiency disease 04/18/2019   Severe preeclampsia superimposed on chronic hypertension, delivered 10/04/2018   Uterine fibroid 04/21/2018   History of prior pregnancy with IUGR newborn 04/15/2018   History of cervical cerclage 03/09/2018    Past Surgical History:  Procedure Laterality Date   CESAREAN SECTION N/A 10/23/2019    Procedure: CESAREAN SECTION;  Surgeon: Tereso Newcomer, MD;  Location: [facility];  Service: Obstetrics;  Laterality: N/A;  Urgent Abruption   TUBAL LIGATION      OB History     Gravida  3   Para  3   Term  3   Preterm  0   AB  0   Living  3      SAB  0   IAB  0   Ectopic  0   Multiple  0   Live Births  3            Home Medications    Prior to Admission medications   Medication Sig Start Date End Date Taking? Authorizing Provider  amLODipine (NORVASC) 5 MG tablet Take 1 tablet (5 mg total) by mouth daily. 12/27/22   Valentino Nose, NP  blood glucose meter kit and supplies Check your blood sugar every day before your first meal. 02/19/21   Elenore Paddy, NP  Cholecalciferol (VITAMIN D3) 250 MCG (10000 UT) TABS Take 10,000 Units by mouth daily.    [provider]  clobetasol ointment (TEMOVATE) 0.05 % Apply 1 application. topically 2 (two) times daily. 11/02/21   Particia Nearing, PA-C  ferrous sulfate 325 (65 FE) MG tablet Take 1 tablet (325 mg total) by mouth 2 (two) times daily with a meal. 10/26/19   Anyanwu, Jethro Bastos, MD  fluticasone (FLONASE) 50 MCG/ACT nasal spray Place 2 sprays into both nostrils daily. 08/07/21   Wallis Bamberg, PA-C  levocetirizine (XYZAL) 5 MG tablet Take 1 tablet (5 mg total) by mouth every evening. 08/07/21   Wallis Bamberg, PA-C  pseudoephedrine (SUDAFED) 30 MG tablet Take 1 tablet (30 mg total) by mouth every 8 (eight) hours as needed for congestion. 08/07/21   Wallis Bamberg, PA-C    Family History Family History  Problem Relation Age of Onset   Other Paternal Grandfather        MVA   Breast cancer Paternal Grandmother    Alzheimer's disease Maternal Grandmother    Cancer Maternal Grandfather        throat   Diabetes Father    Other Father        has a pacemaker   Hypertension Mother    Hypertension Brother    Hypertension Sister    Hypertension Sister    Bronchiolitis Son    Hypertension Paternal Aunt      Social History Social History   Tobacco Use   Smoking status: Former    Years: 4    Types: Cigarettes   Smokeless tobacco: Never  Vaping Use   Vaping Use: Never used  Substance Use Topics   Alcohol use: No   Drug use: No     Allergies   Patient has no known allergies.   Review of Systems Review of Systems Per HPI  Physical Exam Triage Vital Signs ED Triage Vitals  Enc Vitals Group     BP 12/27/22 1120 (!) 169/113     Pulse Rate 12/27/22 1120 (!) 125     Resp 12/27/22 1120 18     Temp 12/27/22 1120 98.6 F (37 C)     Temp Source 12/27/22 1120 Oral     SpO2 12/27/22 1120 98 %     Weight --      Height --      Head Circumference --      Peak Flow --      Pain Score 12/27/22 1122 5     Pain Loc --      Pain Edu? --      Excl. in [place]? --    No data found.  Updated Vital Signs BP (!) 164/114 (BP Location: Right Arm) Comment: provider made aware  Pulse (!) 125 Comment: provider made aware  Temp 98.6 F (37 C) (Oral)   Resp 18   LMP 12/24/2022   SpO2 98%   Visual Acuity Right Eye Distance:   Left Eye Distance:   Bilateral Distance:    Right Eye Near:   Left Eye Near:    Bilateral Near:     Physical Exam Vitals and nursing note reviewed.  Constitutional:      General: She is not in acute distress.    Appearance: Normal appearance. She is not toxic-appearing.  HENT:     Mouth/Throat:     Mouth: Mucous membranes are moist.     Pharynx: Oropharynx is clear.  Eyes:     Extraocular Movements: Extraocular movements intact.     Pupils: Pupils are equal, round, and reactive to light.  Cardiovascular:     Rate and Rhythm: Normal rate and regular rhythm.  Pulmonary:     Effort: Pulmonary effort is normal. No respiratory distress.     Breath sounds: Normal breath sounds. No wheezing, rhonchi or rales.  Musculoskeletal:     Comments: Inspection: No swelling, obvious deformity, redness, or  bruising to right dorsal foot or phalanges Palpation: Right  dorsal foot tender to palpation along the first and second metatarsals; no obvious deformities palpated ROM: Full ROM to right ankle and flexibility of right foot Strength: 5/5 right lower extremity Neurovascular: neurovascularly intact in right lower extremity   Skin:    General: Skin is warm and dry.     Capillary Refill: Capillary refill takes less than 2 seconds.     Coloration: Skin is not jaundiced or pale.     Findings: No erythema.  Neurological:     Mental Status: She is alert and oriented to person, place, and time.  Psychiatric:        Behavior: Behavior is cooperative.      UC Treatments / Results  Labs (all labs ordered are listed, but only abnormal results are displayed) Labs Reviewed - No data to display  EKG   Radiology DG Foot Complete Right  Result Date: 12/27/2022 CLINICAL DATA:  Trauma, pain EXAM: RIGHT FOOT COMPLETE - 3+ VIEW COMPARISON:  None Available. FINDINGS: No fracture or dislocation is seen. Plantar spur is seen in calcaneus. IMPRESSION: No fracture or dislocation is seen in right foot. Small plantar spur is seen in calcaneus. Electronically Signed   By: Ernie Avena M.D.   On: 12/27/2022 12:17    Procedures Procedures (including critical care time)  Medications Ordered in UC Medications - No data to display  Initial Impression / Assessment and Plan / UC Course  I have reviewed the triage vital signs and the nursing notes.  Pertinent labs & imaging results that were available during my care of the patient were reviewed by me and considered in my medical decision making (see chart for details).   Patient is well-appearing, normotensive, afebrile, not tachycardic, not tachypneic, oxygenating well on room air.    1. Tachycardia 2. Essential hypertension, benign Heart rate improved from 125-100 bpm with rest EKG today shows normal sinus rhythm without any ST segment or T wave abnormalities Recommended resuming amlodipine and prescription  given Recommended close follow-up with PCP and PCP assistance initiated today  2. Contusion of right foot, initial encounter Foot x-ray today is negative for acute bony abnormality Ace wrap applied Treat with rest, ice, compression, elevation, Tylenol as needed for pain  The patient was given the opportunity to ask questions.  All questions answered to their satisfaction.  The patient is in agreement to this plan.    Final Clinical Impressions(s) / UC Diagnoses   Final diagnoses:  Tachycardia  Essential hypertension, benign  Contusion of right foot, initial encounter     Discharge Instructions      Foot x-ray today is negative for any broken bones.  Please use the Ace wrap when you are walking on your foot.  When you are sitting down, elevate your foot and apply ice 15 minutes on, 45 minutes off every hour while awake.  You can take Tylenol 500 to 1000 mg every 6 hours as needed for pain.  EKG today looks good-heart rate was down to normal.  Resume the amlodipine to treat blood pressure.  Follow-up with a primary care provider-PCP assistance has been initiated today.    ED Prescriptions     Medication Sig Dispense Auth. Provider   amLODipine (NORVASC) 5 MG tablet Take 1 tablet (5 mg total) by mouth daily. 30 tablet Valentino Nose, NP      PDMP not reviewed this encounter.   Valentino Nose, NP 12/27/22 1408

## 2022-12-27 NOTE — ED Triage Notes (Signed)
The pt states she hit her right great toe on Friday  and now has pain to the area. The pt states she has limited mobility to the toe.

## 2022-12-27 NOTE — ED Notes (Signed)
EKG given to NP

## 2022-12-27 NOTE — Discharge Instructions (Signed)
Foot x-ray today is negative for any broken bones.  Please use the Ace wrap when you are walking on your foot.  When you are sitting down, elevate your foot and apply ice 15 minutes on, 45 minutes off every hour while awake.  You can take Tylenol 500 to 1000 mg every 6 hours as needed for pain.  EKG today looks good-heart rate was down to normal.  Resume the amlodipine to treat blood pressure.  Follow-up with a primary care provider-PCP assistance has been initiated today.

## 2023-01-06 ENCOUNTER — Encounter (HOSPITAL_COMMUNITY): Payer: Self-pay

## 2023-02-01 ENCOUNTER — Other Ambulatory Visit: Payer: Self-pay | Admitting: Nurse Practitioner

## 2023-02-01 DIAGNOSIS — I10 Essential (primary) hypertension: Secondary | ICD-10-CM

## 2023-05-28 DIAGNOSIS — Z720 Tobacco use: Secondary | ICD-10-CM | POA: Diagnosis not present

## 2023-05-28 DIAGNOSIS — Z59819 Housing instability, housed unspecified: Secondary | ICD-10-CM | POA: Diagnosis not present

## 2023-05-28 DIAGNOSIS — I10 Essential (primary) hypertension: Secondary | ICD-10-CM | POA: Diagnosis not present

## 2023-06-21 DIAGNOSIS — E669 Obesity, unspecified: Secondary | ICD-10-CM | POA: Diagnosis not present

## 2023-06-21 DIAGNOSIS — I10 Essential (primary) hypertension: Secondary | ICD-10-CM | POA: Diagnosis not present

## 2023-06-21 DIAGNOSIS — Z720 Tobacco use: Secondary | ICD-10-CM | POA: Diagnosis not present

## 2023-06-21 DIAGNOSIS — Z59819 Housing instability, housed unspecified: Secondary | ICD-10-CM | POA: Diagnosis not present

## 2023-08-18 ENCOUNTER — Encounter: Payer: Self-pay | Admitting: Emergency Medicine

## 2023-08-18 ENCOUNTER — Ambulatory Visit
Admission: EM | Admit: 2023-08-18 | Discharge: 2023-08-18 | Disposition: A | Discharge status: Home / Self Care | Payer: Medicaid Other | Attending: Nurse Practitioner | Admitting: Nurse Practitioner

## 2023-08-18 ENCOUNTER — Other Ambulatory Visit: Payer: Self-pay

## 2023-08-18 DIAGNOSIS — J069 Acute upper respiratory infection, unspecified: Secondary | ICD-10-CM

## 2023-08-18 LAB — POC COVID19/FLU A&B COMBO
Covid Antigen, POC: NEGATIVE
Influenza A Antigen, POC: NEGATIVE
Influenza B Antigen, POC: NEGATIVE

## 2023-08-18 MED ORDER — BENZONATATE 100 MG PO CAPS: 100.0000 mg | ORAL_CAPSULE | Freq: Three times a day (TID) | ORAL | 0 refills | Status: AC | PRN

## 2023-08-18 MED ORDER — PROMETHAZINE-DM 6.25-15 MG/5ML PO SYRP: 5.0000 mL | ORAL_SOLUTION | Freq: Every evening | ORAL | 0 refills | Status: AC | PRN

## 2023-08-18 NOTE — ED Triage Notes (Signed)
Pt reports cough, sore throat since yesterday. Denies any known fevers.

## 2023-08-18 NOTE — ED Provider Notes (Signed)
[facility]    CSN: [id number] Arrival date & time: 08/18/23  1928      History   Chief Complaint Chief Complaint  Patient presents with   Cough    HPI Tara Stark is a 43 y.o. female.   Patient presents today with 2-day history of bodyaches, chills, congested and dry cough, runny nose, sore throat, and fatigue.  She denies known fevers, shortness of breath or chest pain, stuffy nose, headache, ear pain, abdominal pain, nausea/vomiting, and diarrhea.  She took Sudafed yesterday which did not help very much.  No known sick contacts, however works at a daycare.    Past Medical History:  Diagnosis Date   Acid indigestion    Ataxia    Diabetes mellitus, type 2 (HCC)    Fibroid    Hemorrhoids    Hypertension    Thyroid disease    Vitamin D deficiency disease 04/18/2019    Patient Active Problem List   Diagnosis Date Noted   Controlled type 2 diabetes mellitus without complication, without long-term current use of insulin (HCC) 11/19/2020   Anemia 11/19/2020   Class 3 severe obesity with serious comorbidity and body mass index (BMI) of 40.0 to 44.9 in adult [facility]) 11/19/2020   Severe preeclampsia 10/23/2019   S/P cesarean section for arrest of descent 10/23/2019   Placental abruption, delivered 10/23/2019   Essential hypertension, benign 05/17/2019   Vitamin D deficiency disease 04/18/2019   Severe preeclampsia superimposed on chronic hypertension, delivered 10/04/2018   Uterine fibroid 04/21/2018   History of prior pregnancy with IUGR newborn 04/15/2018   History of cervical cerclage 03/09/2018    Past Surgical History:  Procedure Laterality Date   CESAREAN SECTION N/A 10/23/2019   Procedure: CESAREAN SECTION;  Surgeon: Tereso Newcomer, MD;  Location: [facility] LD ORS;  Service: Obstetrics;  Laterality: N/A;  Urgent Abruption   TUBAL LIGATION      OB History     Gravida  3   Para  3   Term  3   Preterm  0   AB  0   Living  3      SAB  0    IAB  0   Ectopic  0   Multiple  0   Live Births  3            Home Medications    Prior to Admission medications   Medication Sig Start Date End Date Taking? Authorizing Provider  benzonatate (TESSALON) 100 MG capsule Take 1 capsule (100 mg total) by mouth 3 (three) times daily as needed for cough. Do not take with alcohol or while operating or driving heavy machinery 11/03/79  Yes Valentino Nose, NP  promethazine-dextromethorphan (PROMETHAZINE-DM) 6.25-15 MG/5ML syrup Take 5 mLs by mouth at bedtime as needed for cough. Do not take with alcohol or while driving or operating heavy machinery.  May cause drowsiness. 08/18/23  Yes Cathlean Marseilles A, NP  amLODipine (NORVASC) 5 MG tablet Take 1 tablet (5 mg total) by mouth daily. 12/27/22   Valentino Nose, NP  blood glucose meter kit and supplies Check your blood sugar every day before your first meal. 02/19/21   Elenore Paddy, NP  Cholecalciferol (VITAMIN D3) 250 MCG (10000 UT) TABS Take 10,000 Units by mouth daily.    [provider]  clobetasol ointment (TEMOVATE) 0.05 % Apply 1 application. topically 2 (two) times daily. 11/02/21   Particia Nearing, PA-C  ferrous sulfate 325 (65 FE)  MG tablet Take 1 tablet (325 mg total) by mouth 2 (two) times daily with a meal. Patient not taking: Reported on 08/18/2023 10/26/19   Anyanwu, Jethro Bastos, MD  fluticasone (FLONASE) 50 MCG/ACT nasal spray Place 2 sprays into both nostrils daily. 08/07/21   Wallis Bamberg, PA-C  levocetirizine (XYZAL) 5 MG tablet Take 1 tablet (5 mg total) by mouth every evening. 08/07/21   Wallis Bamberg, PA-C    Family History Family History  Problem Relation Age of Onset   Other Paternal Grandfather        MVA   Breast cancer Paternal Grandmother    Alzheimer's disease Maternal Grandmother    Cancer Maternal Grandfather        throat   Diabetes Father    Other Father        has a pacemaker   Hypertension Mother    Hypertension Brother    Hypertension  Sister    Hypertension Sister    Bronchiolitis Son    Hypertension Paternal Aunt     Social History Social History   Tobacco Use   Smoking status: Former    Types: Cigarettes   Smokeless tobacco: Never  Vaping Use   Vaping status: Never Used  Substance Use Topics   Alcohol use: No   Drug use: No     Allergies   Patient has no known allergies.   Review of Systems Review of Systems Per HPI  Physical Exam Triage Vital Signs ED Triage Vitals  Encounter Vitals Group     BP 08/18/23 1939 (!) 145/94     Systolic BP Percentile --      Diastolic BP Percentile --      Pulse Rate 08/18/23 1939 (!) 107     Resp 08/18/23 1939 20     Temp 08/18/23 1939 99 F (37.2 C)     Temp Source 08/18/23 1939 Oral     SpO2 08/18/23 1939 97 %     Weight --      Height --      Head Circumference --      Peak Flow --      Pain Score 08/18/23 1940 4     Pain Loc --      Pain Education --      Exclude from Growth Chart --    No data found.  Updated Vital Signs BP (!) 145/94 (BP Location: Right Arm)   Pulse (!) 107   Temp 99 F (37.2 C) (Oral)   Resp 20   LMP 08/18/2023 (Approximate)   SpO2 97%   Visual Acuity Right Eye Distance:   Left Eye Distance:   Bilateral Distance:    Right Eye Near:   Left Eye Near:    Bilateral Near:     Physical Exam Vitals and nursing note reviewed.  Constitutional:      General: She is not in acute distress.    Appearance: Normal appearance. She is not ill-appearing or toxic-appearing.  HENT:     Head: Normocephalic and atraumatic.     Right Ear: Tympanic membrane, ear canal and external ear normal.     Left Ear: Tympanic membrane, ear canal and external ear normal.     Nose: No congestion or rhinorrhea.     Mouth/Throat:     Mouth: Mucous membranes are moist.     Pharynx: Oropharynx is clear. No oropharyngeal exudate or posterior oropharyngeal erythema.  Eyes:     General: No scleral icterus.    Extraocular  Movements: Extraocular  movements intact.  Cardiovascular:     Rate and Rhythm: Normal rate and regular rhythm.  Pulmonary:     Effort: Pulmonary effort is normal. No respiratory distress.     Breath sounds: Normal breath sounds. No wheezing, rhonchi or rales.  Musculoskeletal:     Cervical back: Normal range of motion and neck supple.  Lymphadenopathy:     Cervical: Cervical adenopathy present.  Skin:    General: Skin is warm and dry.     Coloration: Skin is not jaundiced or pale.     Findings: No erythema or rash.  Neurological:     Mental Status: She is alert and oriented to person, place, and time.  Psychiatric:        Behavior: Behavior is cooperative.      UC Treatments / Results  Labs (all labs ordered are listed, but only abnormal results are displayed) Labs Reviewed  POC COVID19/FLU A&B COMBO    EKG   Radiology No results found.  Procedures Procedures (including critical care time)  Medications Ordered in UC Medications - No data to display  Initial Impression / Assessment and Plan / UC Course  I have reviewed the triage vital signs and the nursing notes.  Pertinent labs & imaging results that were available during my care of the patient were reviewed by me and considered in my medical decision making (see chart for details).   In triage, patient is mildly hypertensive and tachycardic, otherwise vital signs are stable.  1. Viral URI with cough Overall, vitals and exam are reassuring today Suspect viral etiology, COVID-19 and influenza testing is negative Supportive care discussed including cough suppressant medication, Mucinex, pushing hydration Return and ER precautions discussed Work excuse provided  The patient was given the opportunity to ask questions.  All questions answered to their satisfaction.  The patient is in agreement to this plan.    Final Clinical Impressions(s) / UC Diagnoses   Final diagnoses:  Viral URI with cough     Discharge Instructions       You have a viral upper respiratory infection.  Symptoms should improve over the next week to 10 days.  If you develop chest pain or shortness of breath, go to the emergency room.  COVID-19 and influenza test is negative today.  Some things that can make you feel better are: - Increased rest - Increasing fluid with water/sugar free electrolytes - Acetaminophen and ibuprofen as needed for fever/pain - Salt water gargling, chloraseptic spray and throat lozenges - OTC guaifenesin (Mucinex) 600 mg twice daily - Saline sinus flushes or a neti pot - Humidifying the air -Tessalon Perles every 8 hours as needed for dry cough and cough syrup at night time for cough     ED Prescriptions     Medication Sig Dispense Auth. Provider   benzonatate (TESSALON) 100 MG capsule Take 1 capsule (100 mg total) by mouth 3 (three) times daily as needed for cough. Do not take with alcohol or while operating or driving heavy machinery 21 capsule Cathlean Marseilles A, NP   promethazine-dextromethorphan (PROMETHAZINE-DM) 6.25-15 MG/5ML syrup Take 5 mLs by mouth at bedtime as needed for cough. Do not take with alcohol or while driving or operating heavy machinery.  May cause drowsiness. 118 mL Valentino Nose, NP      PDMP not reviewed this encounter.   Valentino Nose, NP 08/19/23 [phone]

## 2023-08-18 NOTE — Discharge Instructions (Signed)
You have a viral upper respiratory infection.  Symptoms should improve over the next week to 10 days.  If you develop chest pain or shortness of breath, go to the emergency room.  COVID-19 and influenza test is negative today.  Some things that can make you feel better are: - Increased rest - Increasing fluid with water/sugar free electrolytes - Acetaminophen and ibuprofen as needed for fever/pain - Salt water gargling, chloraseptic spray and throat lozenges - OTC guaifenesin (Mucinex) 600 mg twice daily - Saline sinus flushes or a neti pot - Humidifying the air -Tessalon Perles every 8 hours as needed for dry cough and cough syrup at night time for cough

## 2023-11-05 ENCOUNTER — Emergency Department (HOSPITAL_COMMUNITY)

## 2023-11-05 ENCOUNTER — Emergency Department (HOSPITAL_COMMUNITY)
Admission: EM | Admit: 2023-11-05 | Discharge: 2023-11-05 | Disposition: A | Discharge status: Home / Self Care | Attending: Emergency Medicine | Admitting: Emergency Medicine

## 2023-11-05 ENCOUNTER — Other Ambulatory Visit: Payer: Self-pay

## 2023-11-05 ENCOUNTER — Encounter (HOSPITAL_COMMUNITY): Payer: Self-pay | Admitting: Emergency Medicine

## 2023-11-05 DIAGNOSIS — M25562 Pain in left knee: Secondary | ICD-10-CM | POA: Insufficient documentation

## 2023-11-05 DIAGNOSIS — Z79899 Other long term (current) drug therapy: Secondary | ICD-10-CM | POA: Diagnosis not present

## 2023-11-05 DIAGNOSIS — I10 Essential (primary) hypertension: Secondary | ICD-10-CM | POA: Insufficient documentation

## 2023-11-05 DIAGNOSIS — E119 Type 2 diabetes mellitus without complications: Secondary | ICD-10-CM | POA: Diagnosis not present

## 2023-11-05 MED ORDER — IBUPROFEN 600 MG PO TABS: 600.0000 mg | ORAL_TABLET | Freq: Four times a day (QID) | ORAL | 0 refills | Status: AC | PRN

## 2023-11-05 MED ORDER — IBUPROFEN 400 MG PO TABS
600.0000 mg | ORAL_TABLET | Freq: Once | ORAL | Status: AC
  Administered 2023-11-05: 600 mg via ORAL
  Filled 2023-11-05: qty 2

## 2023-11-05 NOTE — ED Provider Notes (Signed)
 [place] EMERGENCY DEPARTMENT AT [facility] Provider Note   CSN: [id number] Arrival date & time: 11/05/23  1605     History  Chief Complaint  Patient presents with   Knee Pain    Tara Stark is a 43 y.o. female.  She has PMH of hypertension, type 2 diabetes, presents the ER today for evaluation of left knee pain.  She states started yesterday while at work. 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 She is not taking anything for pain she denies any numbness or tingling, no severe swelling, no calf tenderness.  She is able to bear full weight and ambulate.  Knee Pain      Home Medications Prior to Admission medications   Medication Sig Start Date End Date Taking? Authorizing Provider  ibuprofen (ADVIL) 600 MG tablet Take 1 tablet (600 mg total) by mouth every 6 (six) hours as needed. 11/05/23  Yes Shaniyah Wix A, PA-C  amLODipine (NORVASC) 5 MG tablet Take 1 tablet (5 mg total) by mouth daily. 12/27/22   Wilhemena Harbour, NP  benzonatate (TESSALON) 100 MG capsule Take 1 capsule (100 mg total) by mouth 3 (three) times daily as needed for cough. Do not take with alcohol or while operating or driving heavy machinery 7/82/95   Thena Fireman A, NP  blood glucose meter kit and supplies Check your blood sugar every day before your first meal. 02/19/21   Zorita Hiss, NP  Cholecalciferol (VITAMIN D3) 250 MCG (10000 UT) TABS Take 10,000 Units by mouth daily.    [provider]  clobetasol ointment (TEMOVATE) 0.05 % Apply 1 application. topically 2 (two) times daily. 11/02/21   Corbin Dess, PA-C  ferrous sulfate 325 (65 FE) MG tablet Take 1 tablet (325 mg total) by mouth 2 (two) times daily with a meal. Patient not taking: Reported on 08/18/2023 10/26/19   Anyanwu, Ugonna A, MD  fluticasone (FLONASE) 50 MCG/ACT nasal spray Place 2 sprays into both nostrils  daily. 08/07/21   Adolph Hoop, PA-C  levocetirizine (XYZAL) 5 MG tablet Take 1 tablet (5 mg total) by mouth every evening. 08/07/21   Adolph Hoop, PA-C  promethazine-dextromethorphan (PROMETHAZINE-DM) 6.25-15 MG/5ML syrup Take 5 mLs by mouth at bedtime as needed for cough. Do not take with alcohol or while driving or operating heavy machinery.  May cause drowsiness. 08/18/23   Wilhemena Harbour, NP      Allergies    Patient has no known allergies.    Review of Systems   Review of Systems  Physical Exam Updated Vital Signs BP (!) 158/107 (BP Location: Right Arm)   Pulse 82   Temp 97.9 F (36.6 C)   Resp 16   Ht 4\' 11"  (1.499 m)   Wt 81.6 kg   LMP 10/22/2023 (Approximate)   SpO2 100%   BMI 36.36 kg/m  Physical Exam Vitals and nursing note reviewed.  Constitutional:      General: She is not in acute distress.    Appearance: She is well-developed.  HENT:     Head: Normocephalic and atraumatic.     Mouth/Throat:     Mouth: Mucous membranes are moist.  Eyes:     Conjunctiva/sclera: Conjunctivae normal.  Cardiovascular:     Rate and Rhythm: Normal rate and regular rhythm.     Heart sounds: No murmur heard. Pulmonary:     Effort:  Pulmonary effort is normal. No respiratory distress.     Breath sounds: Normal breath sounds.  Abdominal:     Palpations: Abdomen is soft.     Tenderness: There is no abdominal tenderness.  Musculoskeletal:        General: No swelling.     Cervical back: Neck supple.     Comments: Mild tenderness lateral aspect of left knee, no crepitus, no effusion, no overlying skin changes.  No calf tenderness.  DP and PT pulses are intact in the left foot.  Patient can fully flex and extend the left knee without difficulty, normal range of left ankle and hip.  Skin:    General: Skin is warm and dry.     Capillary Refill: Capillary refill takes less than 2 seconds.  Neurological:     General: No focal deficit present.     Mental Status: She is alert and  oriented to person, place, and time.  Psychiatric:        Mood and Affect: Mood normal.     ED Results / Procedures / Treatments   Labs (all labs ordered are listed, but only abnormal results are displayed) Labs Reviewed - No data to display  EKG None  Radiology DG Knee Complete 4 Views Left Result Date: 11/05/2023 CLINICAL DATA:  Pain.  No history of trauma specifically. EXAM: LEFT KNEE - COMPLETE 4 VIEW COMPARISON:  X-ray 03/07/2020.  Images only.  No report FINDINGS: No evidence of fracture, dislocation, or joint effusion. No evidence of arthropathy or other focal bone abnormality. Soft tissues are unremarkable. Stable slight protuberance along the medial margin of the proximal tibial metaphysis. Possible tug lesion or small osteochondroma. If there is persistent pain or further concern additional evaluation as clinically appropriate to assess for occult abnormality. IMPRESSION: No acute osseous abnormality. Electronically Signed   By: Adrianna Horde M.D.   On: 11/05/2023 18:06    Procedures Procedures    Medications Ordered in ED Medications  ibuprofen (ADVIL) tablet 600 mg (600 mg Oral Given 11/05/23 1714)    ED Course/ Medical Decision Making/ A&P                                 Medical Decision Making Frontal diagnosis includes but not limited to sprain, strain, osteo arthritis, contusion, fracture, septic arthritis, gout, other  Course: Patient here for left knee pain without trauma or injury, no swelling or redness.  Normal range of motion, bilateral knee tenderness.  Patient think she could have twisted the knee without realizing it while at work.  She is having pain with prolonged standing while at work.  No signs of DVT on exam.  Xray shows no acute findings, but the radiologist did mention possible tug lesion vs osteochondroma that is stable since 2021. Pt notified of finding, unlikely cause of pain but to follow up with ortho  Amount and/or Complexity of Data  Reviewed Radiology: ordered and independent interpretation performed.    Details: No fracture or dislocation  Risk Prescription drug management.           Final Clinical Impression(s) / ED Diagnoses Final diagnoses:  Acute pain of left knee    Rx / DC Orders ED Discharge Orders          Ordered    ibuprofen (ADVIL) 600 MG tablet  Every 6 hours PRN        11/05/23 1822  Joshua Nieves 11/06/23 9147    Cheyenne Cotta, MD 11/06/23 1051

## 2023-11-05 NOTE — Discharge Instructions (Addendum)
 You are seen in the emergency room today for left knee pain.  Your x-rays were unremarkable.  There is no evidence of a fracture or dislocation or significant arthritis.  It does show a small irregularity that has been noted in the past, this could be a benign overgrowth of cartilage or bone.  Your pain today, you may have a sprain or strain.  You can use ice for 20 minutes at a time twice a day, try to rest, use a knee brace to help support your knee and follow-up closely with your PCP.  If you are not improving you may need to go see orthopedics and have further testing.  Come back if you have fevers, significant swelling or redness to the knee or any other worrisome changes.

## 2023-11-05 NOTE — ED Triage Notes (Signed)
 Pt to ER with c/o left knee pain and swelling.  Pt denies known injury but states that she walks a lot at work.  Pt staets first noted yesterday.

## 2023-11-05 NOTE — ED Notes (Signed)
 ED Provider at bedside.

## 2024-06-12 DIAGNOSIS — R059 Cough, unspecified: Secondary | ICD-10-CM | POA: Diagnosis not present

## 2024-06-12 DIAGNOSIS — I10 Essential (primary) hypertension: Secondary | ICD-10-CM | POA: Diagnosis not present

## 2024-06-12 DIAGNOSIS — H6692 Otitis media, unspecified, left ear: Secondary | ICD-10-CM | POA: Diagnosis not present
